# Patient Record
Sex: Female | Born: 1935 | Race: Black or African American | Hispanic: No | Smoking: Former smoker
Health system: Southern US, Community
[De-identification: ages and names within clinical notes are randomized; demographics above are authoritative.]

## PROBLEM LIST (undated history)

## (undated) DIAGNOSIS — T7840XA Allergy, unspecified, initial encounter: Secondary | ICD-10-CM

## (undated) DIAGNOSIS — N289 Disorder of kidney and ureter, unspecified: Secondary | ICD-10-CM

## (undated) DIAGNOSIS — E079 Disorder of thyroid, unspecified: Secondary | ICD-10-CM

## (undated) DIAGNOSIS — E119 Type 2 diabetes mellitus without complications: Secondary | ICD-10-CM

## (undated) HISTORY — DX: Type 2 diabetes mellitus without complications: E11.9

## (undated) HISTORY — PX: CHOLECYSTECTOMY: SHX55

## (undated) HISTORY — DX: Allergy, unspecified, initial encounter: T78.40XA

## (undated) HISTORY — DX: Disorder of thyroid, unspecified: E07.9

---

## 2004-07-29 ENCOUNTER — Ambulatory Visit: Payer: Self-pay | Admitting: Obstetrics and Gynecology

## 2005-08-09 ENCOUNTER — Ambulatory Visit: Payer: Self-pay | Admitting: Obstetrics and Gynecology

## 2005-10-13 ENCOUNTER — Ambulatory Visit: Payer: Self-pay | Admitting: Gastroenterology

## 2006-08-23 ENCOUNTER — Ambulatory Visit: Payer: Self-pay | Admitting: Obstetrics and Gynecology

## 2008-09-09 ENCOUNTER — Ambulatory Visit: Payer: Self-pay | Admitting: Obstetrics and Gynecology

## 2008-11-17 ENCOUNTER — Ambulatory Visit: Payer: Self-pay | Admitting: Gastroenterology

## 2009-07-19 ENCOUNTER — Emergency Department (HOSPITAL_COMMUNITY): Admission: EM | Admit: 2009-07-19 | Discharge: 2009-07-19 | Payer: Self-pay | Admitting: Emergency Medicine

## 2009-10-14 ENCOUNTER — Ambulatory Visit: Payer: Self-pay | Admitting: Obstetrics and Gynecology

## 2010-01-13 ENCOUNTER — Ambulatory Visit: Payer: Self-pay | Admitting: Internal Medicine

## 2010-01-26 ENCOUNTER — Ambulatory Visit: Payer: Self-pay | Admitting: Internal Medicine

## 2010-10-18 ENCOUNTER — Ambulatory Visit: Payer: Self-pay | Admitting: Obstetrics and Gynecology

## 2010-11-28 ENCOUNTER — Ambulatory Visit: Payer: Self-pay | Admitting: Internal Medicine

## 2010-11-29 LAB — CANCER ANTIGEN 19-9: CA 19-9: 10 U/mL (ref 0–35)

## 2010-11-30 LAB — PROT IMMUNOELECTROPHORES(ARMC)

## 2010-12-05 ENCOUNTER — Ambulatory Visit: Payer: Self-pay | Admitting: Internal Medicine

## 2010-12-19 ENCOUNTER — Ambulatory Visit: Payer: Self-pay | Admitting: Internal Medicine

## 2011-01-19 ENCOUNTER — Ambulatory Visit: Payer: Self-pay | Admitting: Internal Medicine

## 2011-01-19 ENCOUNTER — Ambulatory Visit: Payer: Self-pay | Admitting: Physician Assistant

## 2011-01-30 ENCOUNTER — Ambulatory Visit: Payer: Self-pay | Admitting: Physician Assistant

## 2011-08-12 ENCOUNTER — Ambulatory Visit: Payer: Self-pay

## 2011-11-23 ENCOUNTER — Ambulatory Visit: Payer: Self-pay | Admitting: Obstetrics and Gynecology

## 2012-04-25 ENCOUNTER — Ambulatory Visit: Payer: Self-pay | Admitting: Gastroenterology

## 2012-04-26 LAB — PATHOLOGY REPORT

## 2012-12-24 ENCOUNTER — Ambulatory Visit: Payer: Self-pay | Admitting: Obstetrics and Gynecology

## 2013-02-27 ENCOUNTER — Ambulatory Visit: Payer: Self-pay | Admitting: Internal Medicine

## 2013-03-04 ENCOUNTER — Ambulatory Visit: Payer: Self-pay | Admitting: Internal Medicine

## 2013-06-17 ENCOUNTER — Ambulatory Visit: Payer: Self-pay

## 2013-07-15 ENCOUNTER — Ambulatory Visit: Payer: Self-pay

## 2013-08-06 LAB — CBC WITH DIFFERENTIAL/PLATELET
BASOS ABS: 0 10*3/uL (ref 0.0–0.1)
BASOS PCT: 0.4 %
EOS ABS: 0 10*3/uL (ref 0.0–0.7)
Eosinophil %: 0.2 %
HCT: 37.5 % (ref 35.0–47.0)
HGB: 11.8 g/dL — ABNORMAL LOW (ref 12.0–16.0)
Lymphocyte #: 0.7 10*3/uL — ABNORMAL LOW (ref 1.0–3.6)
Lymphocyte %: 6.4 %
MCH: 26.4 pg (ref 26.0–34.0)
MCHC: 31.6 g/dL — AB (ref 32.0–36.0)
MCV: 84 fL (ref 80–100)
MONO ABS: 0.8 x10 3/mm (ref 0.2–0.9)
MONOS PCT: 7.6 %
Neutrophil #: 9 10*3/uL — ABNORMAL HIGH (ref 1.4–6.5)
Neutrophil %: 85.4 %
PLATELETS: 348 10*3/uL (ref 150–440)
RBC: 4.48 10*6/uL (ref 3.80–5.20)
RDW: 14.7 % — AB (ref 11.5–14.5)
WBC: 10.5 10*3/uL (ref 3.6–11.0)

## 2013-08-06 LAB — COMPREHENSIVE METABOLIC PANEL
ALT: 200 U/L — AB (ref 12–78)
ANION GAP: 7 (ref 7–16)
Albumin: 3.7 g/dL (ref 3.4–5.0)
Alkaline Phosphatase: 146 U/L — ABNORMAL HIGH
BILIRUBIN TOTAL: 2 mg/dL — AB (ref 0.2–1.0)
BUN: 11 mg/dL (ref 7–18)
CHLORIDE: 106 mmol/L (ref 98–107)
Calcium, Total: 9.5 mg/dL (ref 8.5–10.1)
Co2: 25 mmol/L (ref 21–32)
Creatinine: 1 mg/dL (ref 0.60–1.30)
GFR CALC NON AF AMER: 54 — AB
Glucose: 180 mg/dL — ABNORMAL HIGH (ref 65–99)
Osmolality: 280 (ref 275–301)
POTASSIUM: 4 mmol/L (ref 3.5–5.1)
SGOT(AST): 298 U/L — ABNORMAL HIGH (ref 15–37)
Sodium: 138 mmol/L (ref 136–145)
Total Protein: 7.7 g/dL (ref 6.4–8.2)

## 2013-08-06 LAB — LIPASE, BLOOD: Lipase: >10000 U/L — ABNORMAL HIGH (ref 73–393)

## 2013-08-06 LAB — URINALYSIS, COMPLETE
Bacteria: NONE SEEN
Bilirubin,UR: NEGATIVE
Blood: NEGATIVE
Glucose,UR: NEGATIVE mg/dL (ref 0–75)
Ketone: NEGATIVE
Leukocyte Esterase: NEGATIVE
NITRITE: NEGATIVE
PH: 7 (ref 4.5–8.0)
PROTEIN: NEGATIVE
RBC,UR: 1 /HPF (ref 0–5)
SPECIFIC GRAVITY: 1.009 (ref 1.003–1.030)
Squamous Epithelial: 4
WBC UR: 3 /HPF (ref 0–5)

## 2013-08-06 LAB — TROPONIN I

## 2013-08-07 ENCOUNTER — Inpatient Hospital Stay: Payer: Self-pay | Admitting: Internal Medicine

## 2013-08-08 LAB — CBC WITH DIFFERENTIAL/PLATELET
BASOS ABS: 0 10*3/uL (ref 0.0–0.1)
BASOS PCT: 0.5 %
EOS ABS: 0.2 10*3/uL (ref 0.0–0.7)
Eosinophil %: 3.2 %
HCT: 29.1 % — ABNORMAL LOW (ref 35.0–47.0)
HGB: 9.2 g/dL — ABNORMAL LOW (ref 12.0–16.0)
Lymphocyte #: 1.4 10*3/uL (ref 1.0–3.6)
Lymphocyte %: 24.5 %
MCH: 26.7 pg (ref 26.0–34.0)
MCHC: 31.7 g/dL — ABNORMAL LOW (ref 32.0–36.0)
MCV: 84 fL (ref 80–100)
Monocyte #: 0.7 x10 3/mm (ref 0.2–0.9)
Monocyte %: 11.2 %
NEUTROS ABS: 3.5 10*3/uL (ref 1.4–6.5)
NEUTROS PCT: 60.6 %
Platelet: 261 10*3/uL (ref 150–440)
RBC: 3.45 10*6/uL — AB (ref 3.80–5.20)
RDW: 15 % — ABNORMAL HIGH (ref 11.5–14.5)
WBC: 5.9 10*3/uL (ref 3.6–11.0)

## 2013-08-08 LAB — COMPREHENSIVE METABOLIC PANEL
ALBUMIN: 2.8 g/dL — AB (ref 3.4–5.0)
ALK PHOS: 103 U/L
ANION GAP: 7 (ref 7–16)
AST: 55 U/L — AB (ref 15–37)
BUN: 8 mg/dL (ref 7–18)
Bilirubin,Total: 0.8 mg/dL (ref 0.2–1.0)
CHLORIDE: 110 mmol/L — AB (ref 98–107)
Calcium, Total: 8.2 mg/dL — ABNORMAL LOW (ref 8.5–10.1)
Co2: 24 mmol/L (ref 21–32)
Creatinine: 0.72 mg/dL (ref 0.60–1.30)
EGFR (Non-African Amer.): >60
Glucose: 93 mg/dL (ref 65–99)
OSMOLALITY: 279 (ref 275–301)
Potassium: 3.7 mmol/L (ref 3.5–5.1)
SGPT (ALT): 121 U/L — ABNORMAL HIGH (ref 12–78)
SODIUM: 141 mmol/L (ref 136–145)
TOTAL PROTEIN: 5.9 g/dL — AB (ref 6.4–8.2)

## 2013-08-08 LAB — LIPASE, BLOOD: Lipase: 415 U/L — ABNORMAL HIGH (ref 73–393)

## 2013-08-09 LAB — COMPREHENSIVE METABOLIC PANEL
ALBUMIN: 2.8 g/dL — AB (ref 3.4–5.0)
ALK PHOS: 95 U/L
ALT: 84 U/L — AB (ref 12–78)
AST: 32 U/L (ref 15–37)
Anion Gap: 4 — ABNORMAL LOW (ref 7–16)
BUN: 5 mg/dL — ABNORMAL LOW (ref 7–18)
Bilirubin,Total: 0.5 mg/dL (ref 0.2–1.0)
CHLORIDE: 111 mmol/L — AB (ref 98–107)
Calcium, Total: 8.5 mg/dL (ref 8.5–10.1)
Co2: 26 mmol/L (ref 21–32)
Creatinine: 0.99 mg/dL (ref 0.60–1.30)
GFR CALC NON AF AMER: 55 — AB
GLUCOSE: 141 mg/dL — AB (ref 65–99)
OSMOLALITY: 281 (ref 275–301)
POTASSIUM: 3.5 mmol/L (ref 3.5–5.1)
Sodium: 141 mmol/L (ref 136–145)
Total Protein: 5.9 g/dL — ABNORMAL LOW (ref 6.4–8.2)

## 2013-08-09 LAB — CBC WITH DIFFERENTIAL/PLATELET
Basophil #: 0 10*3/uL (ref 0.0–0.1)
Basophil %: 0.6 %
EOS ABS: 0.1 10*3/uL (ref 0.0–0.7)
EOS PCT: 2.4 %
HCT: 29.2 % — AB (ref 35.0–47.0)
HGB: 9.3 g/dL — ABNORMAL LOW (ref 12.0–16.0)
LYMPHS PCT: 24.3 %
Lymphocyte #: 1.5 10*3/uL (ref 1.0–3.6)
MCH: 26.8 pg (ref 26.0–34.0)
MCHC: 31.7 g/dL — ABNORMAL LOW (ref 32.0–36.0)
MCV: 85 fL (ref 80–100)
Monocyte #: 0.7 x10 3/mm (ref 0.2–0.9)
Monocyte %: 11.8 %
Neutrophil #: 3.8 10*3/uL (ref 1.4–6.5)
Neutrophil %: 60.9 %
Platelet: 269 10*3/uL (ref 150–440)
RBC: 3.45 10*6/uL — AB (ref 3.80–5.20)
RDW: 15.2 % — AB (ref 11.5–14.5)
WBC: 6.2 10*3/uL (ref 3.6–11.0)

## 2013-08-09 LAB — LIPASE, BLOOD: Lipase: 163 U/L (ref 73–393)

## 2013-08-10 LAB — BASIC METABOLIC PANEL
ANION GAP: 6 — AB (ref 7–16)
BUN: 6 mg/dL — ABNORMAL LOW (ref 7–18)
CHLORIDE: 110 mmol/L — AB (ref 98–107)
Calcium, Total: 8.3 mg/dL — ABNORMAL LOW (ref 8.5–10.1)
Co2: 22 mmol/L (ref 21–32)
Creatinine: 0.99 mg/dL (ref 0.60–1.30)
EGFR (Non-African Amer.): 55 — ABNORMAL LOW
GLUCOSE: 146 mg/dL — AB (ref 65–99)
OSMOLALITY: 276 (ref 275–301)
Potassium: 3.7 mmol/L (ref 3.5–5.1)
Sodium: 138 mmol/L (ref 136–145)

## 2013-08-10 LAB — CBC WITH DIFFERENTIAL/PLATELET
Basophil #: 0 10*3/uL (ref 0.0–0.1)
Basophil %: 0.4 %
Eosinophil #: 0 10*3/uL (ref 0.0–0.7)
Eosinophil %: 0 %
HCT: 29.1 % — AB (ref 35.0–47.0)
HGB: 9.3 g/dL — ABNORMAL LOW (ref 12.0–16.0)
LYMPHS PCT: 10.3 %
Lymphocyte #: 1 10*3/uL (ref 1.0–3.6)
MCH: 26.9 pg (ref 26.0–34.0)
MCHC: 32 g/dL (ref 32.0–36.0)
MCV: 84 fL (ref 80–100)
MONO ABS: 1 x10 3/mm — AB (ref 0.2–0.9)
Monocyte %: 9.6 %
Neutrophil #: 8 10*3/uL — ABNORMAL HIGH (ref 1.4–6.5)
Neutrophil %: 79.7 %
Platelet: 256 10*3/uL (ref 150–440)
RBC: 3.47 10*6/uL — AB (ref 3.80–5.20)
RDW: 15 % — ABNORMAL HIGH (ref 11.5–14.5)
WBC: 10.1 10*3/uL (ref 3.6–11.0)

## 2013-08-10 LAB — HEPATIC FUNCTION PANEL A (ARMC)
Albumin: 2.9 g/dL — ABNORMAL LOW (ref 3.4–5.0)
Alkaline Phosphatase: 83 U/L
BILIRUBIN TOTAL: 0.5 mg/dL (ref 0.2–1.0)
Bilirubin, Direct: 0.1 mg/dL (ref 0.00–0.20)
SGOT(AST): 66 U/L — ABNORMAL HIGH (ref 15–37)
SGPT (ALT): 93 U/L — ABNORMAL HIGH (ref 12–78)
Total Protein: 6 g/dL — ABNORMAL LOW (ref 6.4–8.2)

## 2013-08-10 LAB — LIPASE, BLOOD: LIPASE: 89 U/L (ref 73–393)

## 2013-08-11 LAB — CBC WITH DIFFERENTIAL/PLATELET
Basophil #: 0.1 10*3/uL (ref 0.0–0.1)
Basophil %: 1 %
Eosinophil #: 0.1 10*3/uL (ref 0.0–0.7)
Eosinophil %: 1.5 %
HCT: 29 % — ABNORMAL LOW (ref 35.0–47.0)
HGB: 9.4 g/dL — ABNORMAL LOW (ref 12.0–16.0)
Lymphocyte #: 1.8 10*3/uL (ref 1.0–3.6)
Lymphocyte %: 23.6 %
MCH: 27.1 pg (ref 26.0–34.0)
MCHC: 32.3 g/dL (ref 32.0–36.0)
MCV: 84 fL (ref 80–100)
Monocyte #: 1 x10 3/mm — ABNORMAL HIGH (ref 0.2–0.9)
Monocyte %: 12.6 %
NEUTROS PCT: 61.3 %
Neutrophil #: 4.7 10*3/uL (ref 1.4–6.5)
Platelet: 253 10*3/uL (ref 150–440)
RBC: 3.46 10*6/uL — ABNORMAL LOW (ref 3.80–5.20)
RDW: 14.7 % — ABNORMAL HIGH (ref 11.5–14.5)
WBC: 7.6 10*3/uL (ref 3.6–11.0)

## 2013-08-12 LAB — COMPREHENSIVE METABOLIC PANEL
Albumin: 2.8 g/dL — ABNORMAL LOW (ref 3.4–5.0)
Alkaline Phosphatase: 78 U/L
Anion Gap: 9 (ref 7–16)
BUN: 9 mg/dL (ref 7–18)
Bilirubin,Total: 0.3 mg/dL (ref 0.2–1.0)
CALCIUM: 8.9 mg/dL (ref 8.5–10.1)
Chloride: 106 mmol/L (ref 98–107)
Co2: 25 mmol/L (ref 21–32)
Creatinine: 0.82 mg/dL (ref 0.60–1.30)
EGFR (African American): >60
GLUCOSE: 121 mg/dL — AB (ref 65–99)
Osmolality: 279 (ref 275–301)
Potassium: 3.7 mmol/L (ref 3.5–5.1)
SGOT(AST): 24 U/L (ref 15–37)
SGPT (ALT): 57 U/L (ref 12–78)
SODIUM: 140 mmol/L (ref 136–145)
Total Protein: 5.8 g/dL — ABNORMAL LOW (ref 6.4–8.2)

## 2013-08-12 LAB — CBC WITH DIFFERENTIAL/PLATELET
Basophil #: 0.1 10*3/uL (ref 0.0–0.1)
Basophil %: 0.8 %
Eosinophil #: 0.2 10*3/uL (ref 0.0–0.7)
Eosinophil %: 2.8 %
HCT: 28.8 % — ABNORMAL LOW (ref 35.0–47.0)
HGB: 9.4 g/dL — AB (ref 12.0–16.0)
LYMPHS PCT: 22.3 %
Lymphocyte #: 1.8 10*3/uL (ref 1.0–3.6)
MCH: 27.3 pg (ref 26.0–34.0)
MCHC: 32.7 g/dL (ref 32.0–36.0)
MCV: 84 fL (ref 80–100)
MONOS PCT: 12.1 %
Monocyte #: 1 x10 3/mm — ABNORMAL HIGH (ref 0.2–0.9)
Neutrophil #: 4.9 10*3/uL (ref 1.4–6.5)
Neutrophil %: 62 %
Platelet: 243 10*3/uL (ref 150–440)
RBC: 3.45 10*6/uL — AB (ref 3.80–5.20)
RDW: 15.3 % — ABNORMAL HIGH (ref 11.5–14.5)
WBC: 8 10*3/uL (ref 3.6–11.0)

## 2013-08-13 LAB — PATHOLOGY REPORT

## 2013-09-14 DIAGNOSIS — M1990 Unspecified osteoarthritis, unspecified site: Secondary | ICD-10-CM | POA: Insufficient documentation

## 2013-09-14 DIAGNOSIS — N183 Chronic kidney disease, stage 3 unspecified: Secondary | ICD-10-CM | POA: Insufficient documentation

## 2013-09-14 DIAGNOSIS — E1122 Type 2 diabetes mellitus with diabetic chronic kidney disease: Secondary | ICD-10-CM | POA: Insufficient documentation

## 2013-11-26 ENCOUNTER — Emergency Department: Payer: Self-pay | Admitting: Emergency Medicine

## 2013-11-26 DIAGNOSIS — I10 Essential (primary) hypertension: Secondary | ICD-10-CM | POA: Insufficient documentation

## 2013-11-26 LAB — BASIC METABOLIC PANEL
Anion Gap: 6 — ABNORMAL LOW (ref 7–16)
BUN: 16 mg/dL (ref 7–18)
CHLORIDE: 107 mmol/L (ref 98–107)
CO2: 29 mmol/L (ref 21–32)
Calcium, Total: 9.5 mg/dL (ref 8.5–10.1)
Creatinine: 0.96 mg/dL (ref 0.60–1.30)
EGFR (African American): >60
EGFR (Non-African Amer.): 57 — ABNORMAL LOW
Glucose: 118 mg/dL — ABNORMAL HIGH (ref 65–99)
Osmolality: 285 (ref 275–301)
Potassium: 4.1 mmol/L (ref 3.5–5.1)
Sodium: 142 mmol/L (ref 136–145)

## 2013-11-26 LAB — URINALYSIS, COMPLETE
BILIRUBIN, UR: NEGATIVE
BLOOD: NEGATIVE
Bacteria: NONE SEEN
Glucose,UR: NEGATIVE mg/dL (ref 0–75)
Ketone: NEGATIVE
Leukocyte Esterase: NEGATIVE
Nitrite: NEGATIVE
PROTEIN: NEGATIVE
Ph: 6 (ref 4.5–8.0)
RBC,UR: 1 /HPF (ref 0–5)
SPECIFIC GRAVITY: 1.019 (ref 1.003–1.030)

## 2013-11-26 LAB — CBC
HCT: 36.6 % (ref 35.0–47.0)
HGB: 11.5 g/dL — ABNORMAL LOW (ref 12.0–16.0)
MCH: 27.1 pg (ref 26.0–34.0)
MCHC: 31.5 g/dL — ABNORMAL LOW (ref 32.0–36.0)
MCV: 86 fL (ref 80–100)
PLATELETS: 281 10*3/uL (ref 150–440)
RBC: 4.25 10*6/uL (ref 3.80–5.20)
RDW: 15.3 % — AB (ref 11.5–14.5)
WBC: 7.8 10*3/uL (ref 3.6–11.0)

## 2013-11-26 LAB — CK TOTAL AND CKMB (NOT AT ARMC)
CK, Total: 91 U/L
CK-MB: 1.3 ng/mL (ref 0.5–3.6)

## 2013-11-26 LAB — TROPONIN I: Troponin-I: <0.02 ng/mL

## 2013-12-25 DIAGNOSIS — E042 Nontoxic multinodular goiter: Secondary | ICD-10-CM | POA: Insufficient documentation

## 2013-12-29 ENCOUNTER — Ambulatory Visit: Payer: Self-pay | Admitting: Internal Medicine

## 2014-03-30 ENCOUNTER — Ambulatory Visit: Payer: Self-pay

## 2014-03-30 DIAGNOSIS — I10 Essential (primary) hypertension: Secondary | ICD-10-CM | POA: Diagnosis not present

## 2014-03-30 DIAGNOSIS — E119 Type 2 diabetes mellitus without complications: Secondary | ICD-10-CM | POA: Diagnosis not present

## 2014-03-30 DIAGNOSIS — R609 Edema, unspecified: Secondary | ICD-10-CM | POA: Diagnosis not present

## 2014-03-30 DIAGNOSIS — M7989 Other specified soft tissue disorders: Secondary | ICD-10-CM | POA: Diagnosis not present

## 2014-03-30 DIAGNOSIS — M79605 Pain in left leg: Secondary | ICD-10-CM | POA: Diagnosis not present

## 2014-04-16 DIAGNOSIS — E119 Type 2 diabetes mellitus without complications: Secondary | ICD-10-CM | POA: Diagnosis not present

## 2014-04-16 DIAGNOSIS — R609 Edema, unspecified: Secondary | ICD-10-CM | POA: Diagnosis not present

## 2014-04-24 DIAGNOSIS — E0520 Thyrotoxicosis with toxic multinodular goiter without thyrotoxic crisis or storm: Secondary | ICD-10-CM | POA: Diagnosis not present

## 2014-04-24 DIAGNOSIS — E119 Type 2 diabetes mellitus without complications: Secondary | ICD-10-CM | POA: Diagnosis not present

## 2014-05-01 DIAGNOSIS — E119 Type 2 diabetes mellitus without complications: Secondary | ICD-10-CM | POA: Diagnosis not present

## 2014-05-25 DIAGNOSIS — E7800 Pure hypercholesterolemia, unspecified: Secondary | ICD-10-CM | POA: Insufficient documentation

## 2014-05-25 DIAGNOSIS — E119 Type 2 diabetes mellitus without complications: Secondary | ICD-10-CM | POA: Diagnosis not present

## 2014-05-25 DIAGNOSIS — I10 Essential (primary) hypertension: Secondary | ICD-10-CM | POA: Diagnosis not present

## 2014-05-25 DIAGNOSIS — E0520 Thyrotoxicosis with toxic multinodular goiter without thyrotoxic crisis or storm: Secondary | ICD-10-CM | POA: Diagnosis not present

## 2014-06-15 DIAGNOSIS — M5136 Other intervertebral disc degeneration, lumbar region: Secondary | ICD-10-CM | POA: Diagnosis not present

## 2014-06-15 DIAGNOSIS — M7061 Trochanteric bursitis, right hip: Secondary | ICD-10-CM | POA: Diagnosis not present

## 2014-06-15 DIAGNOSIS — M5416 Radiculopathy, lumbar region: Secondary | ICD-10-CM | POA: Diagnosis not present

## 2014-06-15 DIAGNOSIS — N762 Acute vulvitis: Secondary | ICD-10-CM | POA: Diagnosis not present

## 2014-06-19 ENCOUNTER — Emergency Department
Admit: 2014-06-19 | Disposition: A | Discharge status: Home / Self Care | Payer: Self-pay | Admitting: Emergency Medicine

## 2014-06-19 DIAGNOSIS — R531 Weakness: Secondary | ICD-10-CM | POA: Diagnosis not present

## 2014-06-19 DIAGNOSIS — E119 Type 2 diabetes mellitus without complications: Secondary | ICD-10-CM | POA: Diagnosis not present

## 2014-06-19 DIAGNOSIS — I10 Essential (primary) hypertension: Secondary | ICD-10-CM | POA: Diagnosis not present

## 2014-06-19 DIAGNOSIS — N390 Urinary tract infection, site not specified: Secondary | ICD-10-CM | POA: Diagnosis not present

## 2014-06-19 DIAGNOSIS — Z880 Allergy status to penicillin: Secondary | ICD-10-CM | POA: Diagnosis not present

## 2014-06-19 LAB — CBC WITH DIFFERENTIAL/PLATELET
BASOS PCT: 0.7 %
Basophil #: 0.1 10*3/uL (ref 0.0–0.1)
Eosinophil #: 0.1 10*3/uL (ref 0.0–0.7)
Eosinophil %: 0.5 %
HCT: 35.9 % (ref 35.0–47.0)
HGB: 11.7 g/dL — AB (ref 12.0–16.0)
Lymphocyte #: 1.4 10*3/uL (ref 1.0–3.6)
Lymphocyte %: 12.8 %
MCH: 27.5 pg (ref 26.0–34.0)
MCHC: 32.5 g/dL (ref 32.0–36.0)
MCV: 85 fL (ref 80–100)
MONOS PCT: 8.1 %
Monocyte #: 0.9 x10 3/mm (ref 0.2–0.9)
Neutrophil #: 8.4 10*3/uL — ABNORMAL HIGH (ref 1.4–6.5)
Neutrophil %: 77.9 %
Platelet: 323 10*3/uL (ref 150–440)
RBC: 4.23 10*6/uL (ref 3.80–5.20)
RDW: 14.2 % (ref 11.5–14.5)
WBC: 10.8 10*3/uL (ref 3.6–11.0)

## 2014-06-19 LAB — BASIC METABOLIC PANEL
ANION GAP: 7 (ref 7–16)
BUN: 33 mg/dL — AB
CHLORIDE: 103 mmol/L
CO2: 29 mmol/L
Calcium, Total: 11 mg/dL — ABNORMAL HIGH
Creatinine: 1.65 mg/dL — ABNORMAL HIGH
EGFR (African American): 34 — ABNORMAL LOW
EGFR (Non-African Amer.): 29 — ABNORMAL LOW
GLUCOSE: 156 mg/dL — AB
Potassium: 4.2 mmol/L
SODIUM: 139 mmol/L

## 2014-06-19 LAB — URINALYSIS, COMPLETE
BLOOD: NEGATIVE
Bilirubin,UR: NEGATIVE
Glucose,UR: NEGATIVE mg/dL (ref 0–75)
Ketone: NEGATIVE
NITRITE: NEGATIVE
PH: 6 (ref 4.5–8.0)
Protein: NEGATIVE
RBC,UR: 2 /HPF (ref 0–5)
SPECIFIC GRAVITY: 1.01 (ref 1.003–1.030)
Squamous Epithelial: NONE SEEN

## 2014-06-19 LAB — TROPONIN I

## 2014-06-20 DIAGNOSIS — I10 Essential (primary) hypertension: Secondary | ICD-10-CM | POA: Diagnosis not present

## 2014-06-20 DIAGNOSIS — N390 Urinary tract infection, site not specified: Secondary | ICD-10-CM | POA: Diagnosis not present

## 2014-06-20 DIAGNOSIS — R531 Weakness: Secondary | ICD-10-CM | POA: Diagnosis not present

## 2014-06-20 DIAGNOSIS — E119 Type 2 diabetes mellitus without complications: Secondary | ICD-10-CM | POA: Diagnosis not present

## 2014-06-20 DIAGNOSIS — Z880 Allergy status to penicillin: Secondary | ICD-10-CM | POA: Diagnosis not present

## 2014-06-22 LAB — URINE CULTURE

## 2014-06-29 DIAGNOSIS — I10 Essential (primary) hypertension: Secondary | ICD-10-CM | POA: Diagnosis not present

## 2014-06-29 DIAGNOSIS — E119 Type 2 diabetes mellitus without complications: Secondary | ICD-10-CM | POA: Diagnosis not present

## 2014-06-29 DIAGNOSIS — N390 Urinary tract infection, site not specified: Secondary | ICD-10-CM | POA: Diagnosis not present

## 2014-06-29 DIAGNOSIS — E780 Pure hypercholesterolemia: Secondary | ICD-10-CM | POA: Diagnosis not present

## 2014-07-11 NOTE — Consult Note (Signed)
PATIENT NAME:  Sherry [name], Sherry [name] MR#:  [id number] DATE OF BIRTH:  12/28/1934  DATE OF CONSULTATION:  08/07/2013  REFERRING PHYSICIAN:   CONSULTING PHYSICIAN:  Rodena Goldmann III, MD  PRIMARY CARE PHYSICIAN: Frazier Richards.   ADMITTING PHYSICIAN: [employer].    CHIEF COMPLAINT: Abdominal pain.   BRIEF HISTORY: Sherry [name] is a 80 year old woman with approximately a 2-week history of intermittent abdominal discomfort. The pain is primarily midepigastric, radiating through to her back. It would occur after meals, be accompanied by significant nausea and vomiting and then resolve. She has had multiple episodes and thought it had something to do with the type of food she was eating. She has lost some weight recently. She does have a history of diabetes and has changed her diet. The episode that began on the 19th of May did not resolve, and she presented to the Emergency Room when she could not arrange for evaluation by her primary care physician. Workup in the Emergency Room revealed an elevated white blood cell count at 10,500. Lipase was greater than 10,000. Bilirubin was 2.0 with an alkaline phosphatase of 146. Transaminases were slightly elevated. Gallbladder ultrasound was performed which demonstrated some mild gallbladder wall thickening, pericholecystic fluid and a positive sonographic Murphy sign, along with multiple small gallstones. 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   The patient denies any history of hepatitis, yellow jaundice, pancreatitis, peptic ulcer disease, previous diagnosis of gallbladder disease or diverticulitis. Only previous surgery was a C-section and appendectomy. She has no history of cardiac disease, hypertension or thyroid problems. She is diabetic, currently on oral medications. She is followed by Dr. Frazier Richards at the [facility]. She is a reformed cigarette  smoker and does not drink alcohol. Her only allergy is PENICILLIN which causes swelling in her feet and a rash.   REVIEW OF SYSTEMS: Largely unremarkable.   PHYSICAL EXAMINATION:  GENERAL: She is an alert, pleasant woman lying comfortably in bed.  VITAL SIGNS: Blood pressure 170/88. Heart rate is 90 and regular. She is afebrile.  HEENT: Unremarkable with no evidence of any scleral icterus, pupillary abnormalities, facial deformities.  NECK: Supple, nontender with a midline trachea. No adenopathy.  CHEST: Clear with no adventitious sounds. She has normal pulmonary excursion.  CARDIAC: No murmurs or gallops to my ear, and she seems to be in normal sinus rhythm.  ABDOMEN: Mild upper abdominal tenderness, primarily in the midepigastric area. She does have hypoactive but present bowel sounds. I cannot palpate her gallbladder. She has some mild guarding with no rebound.  LOWER EXTREMITIES: Full range of motion. No deformities.  PSYCHIATRIC: Normal orientation. Normal affect.   IMPRESSION: This woman appears to have biliary pancreatitis. She may have acute cholecystitis in addition, although I suspect that her pericholecystic fluid is likely due to the edema from her pancreatitis. I agree with bowel rest at the present time, and I have no concerns with empiric antibiotics. We will continue to follow her. She may need to be considered for surgical intervention, although surgery will be designed to avoid further insults rather than be therapeutic with the current attack. She is aware of this limitation and is in agreement.   ____________________________ Micheline Maze, MD rle:gb D: 08/07/2013 04:03:07 ET T: 08/07/2013 04:39:28 ET JOB#: [id number]  cc: Micheline Maze, MD, <Dictator> Ocie Cornfield. Ouida Sills, MD Rodena Goldmann MD ELECTRONICALLY SIGNED 08/09/2013 10:13

## 2014-07-11 NOTE — Discharge Summary (Signed)
PATIENT NAME:  Sherry [name], COUSIN MR#:  [id number] DATE OF BIRTH:  [birth year]  DATE OF ADMISSION:  08/07/2013 DATE OF DISCHARGE: 08/12/2013  DISCHARGE DIAGNOSES:  1. Gallstone pancreatitis.  2. Cholecystitis.   DISCHARGE MEDICATIONS:  1. Norco #20 given on prescription 1-2 q. 4 hours p.r.n. pain.   2. Phenergan 25 mg or 20 one q. 4 hours p.r.n. nausea. 3. Progesterone 200 mg daily. 4. Metformin 500 mg 2 daily. 5. Estradiol 0.5 daily.  6. Losartan 100 mg daily.   HISTORY AND PHYSICAL: Please see detailed history and physical done on admission.   HOSPITAL COURSE: The patient was admitted with abdominal pain, found to have gallstone pancreatitis with elevated ALT and AST, lipase over 10,000. She improved over a couple of days, and gallstones were seen on ultrasound. Surgery was consulted. Eventually, cholecystectomy was done. She continued to slowly improve, had some minimal nausea, and possible slight vomiting last night which responded to antiemetics. Her lipase and liver functions gradually improved; and by the 24th, lipase was normal and yesterday's date white count was 7000 and today 8000 with mild anemia, which is likely postoperative acute hemorrhagic. Her ALT and AST were normal on today's date as well. She will likely go home today if she tolerates her breakfast and lunch well. We will arrange to see her soon in the office, and she will see surgery soon as well.    ____________________________ Ocie Cornfield. Ouida Sills, MD [id number] D: 08/12/2013 07:59:44 ET T: 08/12/2013 08:17:55 ET JOB#: [id number]  cc: Ocie Cornfield. Ouida Sills, MD, <Dictator> Kirk Ruths MD ELECTRONICALLY SIGNED 08/12/2013 10:57

## 2014-07-11 NOTE — Op Note (Signed)
PATIENT NAME:  Sherry [name], Sherry [name] MR#:  [id number] DATE OF BIRTH:  Jul 29, 1934  DATE OF PROCEDURE:  08/09/2013  PREOPERATIVE DIAGNOSES:  1. Acute cholecystitis. 2. Biliary pancreatitis.   POSTOPERATIVE DIAGNOSES:  1. Acute cholecystitis. 2. Biliary pancreatitis.   PROCEDURE: Laparoscopic cholecystectomy.   SURGEON: Dr. Pat Patrick.   ANESTHESIA: General.   OPERATIVE PROCEDURE: With the patient in the supine position, after receiving appropriate general anesthesia, the base of the abdomen was prepped with ChloraPrep and draped with sterile towels. Sherry [name] was placed in the head down, feet up position. A small infraumbilical incision was made in the standard fashion and carried down bluntly through the subcutaneous tissue. A Veress needle was used to cannulate the peritoneal cavity. CO2 was insufflated to appropriate pressure measurements. When approximately 2.5 liters of CO2 were instilled, the Veress needle was withdrawn and intraperitoneal position was confirmed. CO2 was reinsufflated. The patient was placed in the head up, feet down position, and rotated slightly to the left side. A subxiphoid transverse incision was made and an 11-mm port inserted under direct vision. Two lateral ports, 5 mm in size, were inserted under direct vision. The camera was moved to the upper port and the midline port examined since Sherry [name] had previous midline surgery. There were multiple adhesions from the omentum, but I do not see any bowel or demonstrate any evidence of bowel injury. Gallbladder was fairly tense and distended. There was significant edema and inflammatory change. There were multiple adhesions. It was retracted superiorly and laterally, exposing the hepatoduodenal ligament. The cystic artery and cystic duct were identified. The cystic duct was clipped on the gallbladder side and opened. The cystic duct was quite thickened and small and I could not get a cholangiogram catheter into the bile duct. However, there was  reflux of bile back out of the duct suggesting there may be proximal obstruction which persists. The duct was triply clipped on the common duct side and divided. The cystic artery was doubly clipped and divided. The gallbladder was then dissected free from its bed and delivered using hook and cautery apparatus. Once the gallbladder was free, it was captured in an Endo Catch apparatus and removed through the subxiphoid incision. There was a small arterial bleeder in the bed of the liver which was cauterized. There were multiple bleeding edges off the liver which were cauterized and treated with manual pressure using instruments pressing the liver against the bowel. The liver was quite distended and fatty with rounded edges clearly abnormal hepatosteatosis. Because of the edema and the bleeding problem from the liver, a 10-mm flat Jackson-Pratt drain was inserted through the epigastric port, brought out through 1 of the lateral stab wounds, and secured in the bed of the liver. It was sutured in place with 3-0 nylon. The area was copiously irrigated with 2 liters of warm saline solution. The abdomen was desufflated. Ports withdrawn without difficulty. Skin incisions were closed with 5-0 nylon. The area was infiltrated with 0.25% Marcaine for postoperative pain control. Sterile dressings were applied. The patient was returned to the recovery room, having tolerated the procedure well. Sponge, instrument, and needle counts were correct x 2 in the operating room.    ____________________________ Sherry Maze, MD rle:lt D: 08/09/2013 10:00:24 ET T: 08/09/2013 20:49:43 ET JOB#: [id number]  cc: Sherry Maze, MD, <Dictator> Ocie Cornfield. Ouida Sills, MD Rodena Goldmann MD ELECTRONICALLY SIGNED 08/10/2013 16:50

## 2014-07-11 NOTE — Discharge Summary (Signed)
ADDENDUM:  PATIENT NAME:  Sherry [name], Sherry [name] MR#:  [id number] DATE OF BIRTH:  [birth year]  DATE OF ADMISSION:  08/07/2013 DATE OF DISCHARGE:  08/13/2013  The patient did well over the course of the day late yesterday and this morning. She had some nausea earlier in the day as her discharge was held. No other complications noted, so she will be discharged on the same medicines without further changes. She has followup with a general surgery and myself soon.   ____________________________ Ocie Cornfield. Ouida Sills, MD mwa:aw D: 08/13/2013 07:47:48 ET T: 08/13/2013 08:16:22 ET JOB#: [id number]  cc: Ocie Cornfield. Ouida Sills, MD, <Dictator> Kirk Ruths MD ELECTRONICALLY SIGNED 08/13/2013 11:18

## 2014-07-11 NOTE — H&P (Signed)
PATIENT NAME:  Sherry [name], Sherry [name] MR#:  [id number] DATE OF BIRTH:  1934/05/10  DATE OF ADMISSION:  08/07/2013  REFERRING PHYSICIAN: Dr. Marjean Donna  PRIMARY CARE PHYSICIAN: Dr. Ouida Sills of [facility].   CHIEF COMPLAINT: Abdominal pain.   HISTORY OF PRESENT ILLNESS:  This is a 80 year old African American female with past medical history of diabetes, was non-insulin-requiring, as well as hypertension, presenting with abdominal pain. She describes a 2-day duration of abdominal pain, acute onset, epigastric in location 9 to 10 out of 10 in intensity, sharp and stabbing in quality with radiation to the back.  States worsened with food and no change with liquid. No relieving factors. She had associated nausea and vomiting multiple, particularly after attempting to eat. She states that her vomit was possibly bile-tinged; however, no blood was noted. Denies any changes in bowel habits. Pain is improved in the Emergency Department after receiving IV morphine.  Through initial work-up from the Emergency Department, revealed to have pancreatitis as well as cholecystitis. Currently, her pain is only 1 to 2 out of 10 in comparison to her prior symptoms.   REVIEW OF SYSTEMS:  CONSTITUTIONAL: Denies fever, fatigue, weakness. EYES: Denies blurry, double vision, or eye pain.  HEENT: Denies tinnitus, ear pain, hearing loss.  RESPIRATORY: Denies cough, wheeze, shortness of breath. CARDIOVASCULAR: . Denies chest pain, palpitations or edema.  GASTROINTESTINAL: Positive for nausea, vomiting, abdominal pain as described above.  Denies any diarrhea or melena. GENITOURINARY: Denies dysuria or hematuria.  ENDOCRINE: Denies nocturia or thyroid problems.  HEMATOLOGIC AND LYMPHATIC: Denies easy bruising, bleeding. SKIN: Denies rash or lesions. MUSCULOSKELETAL: Denies pain in neck, back, shoulders, knees, or hips.  NEUROLOGIC: Denies paralysis or paresthesias.  PSYCHIATRIC: Denies anxiety or depressive symptoms.    Otherwise, full review of systems performed is negative.   PAST MEDICAL HISTORY: Hypertension, osteoporosis, enlarged thyroid which she states she has received 1 dose of radioactive iodine as well as diabetes.   SOCIAL HISTORY: Remote tobacco usage. Has not smoked in greater than 25 years. Occasional alcohol usage. Denies drug usage.   FAMILY HISTORY: Positive for diabetes, as well as lung cancer.   ALLERGIES: PENICILLIN WHICH CAUSES SWELLING.   HOME MEDICATIONS: Includes losartan 100 mg p.o. daily, metformin extended release 500 mg p.o. b.i.d., estradiol 0.5 mg p.o. daily, progesterone 200 mg p.o. daily.   PHYSICAL EXAMINATION:  VITAL SIGNS: Temperature 98.7, heart rate 89, respirations 20, blood pressure 189/80, saturating 99% on room air, weight 84.8 kg, BMI of 31.1.  GENERAL: Well-nourished, well-developed [place] American female, currently in no acute distress.  HEAD: Normocephalic, atraumatic.  EYES: Pupils equal, round, reactive to light. Extraocular muscles intact. Positive for scleral icterus.  MOUTH: Moist mucosal membranes.  Dentition intact. No abscess noted.  Positive sublingual jaundice.  Throat clear without exudates. No external lesions.  NECK: Supple. No thyromegaly. No nodules. No JVD.  PULMONARY: Clear to auscultation bilaterally without wheezes, rubs, or rhonchi. No use of accessory muscles. Good respiratory effort.  CHEST:  Nontender on palpation.  CARDIOVASCULAR: S1, S2, regular rate and rhythm. No murmurs, rubs, or gallops. No edema. Pedal pulses 2+ bilaterally.  GASTROINTESTINAL: Soft, tender to palpation over epigastric with voluntary guarding; however, no rebound or motion tenderness. Nondistended. No masses. Hypoactive bowel sounds. No hepatosplenomegaly.   MUSCULOSKELETAL: No swelling, clubbing, or edema. Range of motion is full in all extremities. NEUROLOGIC: Cranial nerves II through XII intact. No gross focal neurologic deficits. Sensation intact. Reflexes  intact.  SKIN: No ulcerations, lesions, rashes, cyanosis.  Skin warm, dry. Turgor intact.  PSYCHIATRIC: Mood and affect within normal limits. The patient awake, alert, and oriented x 3. Insight and judgment intact.   LABORATORY DATA:  Abdominal ultrasound performed revealing sonographic findings consistent with acute cholecystitis with multiple small gallstones within the gallbladder and gallbladder wall  thickening at 3.2 mm and there is pericholecystic fluid. There is also a note of some benign liver lesions, which appear unchanged from prior examinations. Remainder of laboratory data: Sodium 130, potassium 4, chloride 106, bicarbonate 25, BUN 11, creatinine 1, glucose 180, lipase greater than 10,000. LFTs: Total protein 7.7, albumin 3.7, bilirubin 2, alkaline phosphatase 146, AST 298, ALT 200, troponin less than 0.02. WBC of 10.5, hemoglobin 11.8, platelets of 348,000. Urinalysis negative for evidence of infection.   ASSESSMENT AND PLAN: A 80 year old [place] American female with a history of diabetes, hypertension, abdominal pain, found to have pancreatitis as well as cholecystitis. 1. Acute pancreatitis. Etiology, given the workup and presentation, most consistent with gallstone pancreatitis. We will provide IV fluid hydration with normal saline at 200 mL an hour as well as pain medications. She will be placed on an n.p.o. status.  Whenever she no longer requires pain medication, she can advance her diet.  2. Acute cholecystitis. We will consult general surgery. They have already been paged by the Emergency Department staff however currently in the operating room. The patient has no signs of sepsis. We will dose antibiotics for secondary prevention with ciprofloxacin and Flagyl given her penicillin allergy which causes swelling.  3. Diabetes.  We will hold p.o. agents and add insulin sliding scale with q. 6 hour Accu-Cheks.  4. Hypertension. We will add p.r.n. hydralazine for systolic blood pressures  greater than 080 or diastolics greater than 223.  5. Venous thromboembolism prophylaxis with SCDs.   CODE STATUS: The patient is full code.   TIME SPENT: 45 minutes.    ____________________________ Aaron Mose. Nakeeta Sebastiani, MD dkh:dd D: 08/07/2013 01:58:15 ET T: 08/07/2013 02:23:29 ET JOB#: [id number]  cc: Aaron Mose. Viviane Semidey, MD, <Dictator> Iviana Blasingame Woodfin Ganja MD ELECTRONICALLY SIGNED 08/07/2013 20:28

## 2014-07-13 DIAGNOSIS — E119 Type 2 diabetes mellitus without complications: Secondary | ICD-10-CM | POA: Diagnosis not present

## 2014-07-13 DIAGNOSIS — I10 Essential (primary) hypertension: Secondary | ICD-10-CM | POA: Diagnosis not present

## 2014-07-13 DIAGNOSIS — N179 Acute kidney failure, unspecified: Secondary | ICD-10-CM | POA: Diagnosis not present

## 2014-07-13 DIAGNOSIS — N390 Urinary tract infection, site not specified: Secondary | ICD-10-CM | POA: Diagnosis not present

## 2014-07-14 ENCOUNTER — Other Ambulatory Visit: Payer: Self-pay | Admitting: Physical Medicine and Rehabilitation

## 2014-07-14 DIAGNOSIS — M5416 Radiculopathy, lumbar region: Secondary | ICD-10-CM | POA: Insufficient documentation

## 2014-07-14 DIAGNOSIS — M5136 Other intervertebral disc degeneration, lumbar region: Secondary | ICD-10-CM | POA: Diagnosis not present

## 2014-07-14 DIAGNOSIS — M5417 Radiculopathy, lumbosacral region: Secondary | ICD-10-CM

## 2014-07-14 DIAGNOSIS — M7061 Trochanteric bursitis, right hip: Secondary | ICD-10-CM | POA: Diagnosis not present

## 2014-07-23 ENCOUNTER — Ambulatory Visit
Admission: RE | Admit: 2014-07-23 | Discharge: 2014-07-23 | Disposition: A | Discharge status: Home / Self Care | Payer: Commercial Managed Care - HMO | Source: Ambulatory Visit | Attending: Physical Medicine and Rehabilitation | Admitting: Physical Medicine and Rehabilitation

## 2014-07-23 DIAGNOSIS — M5417 Radiculopathy, lumbosacral region: Secondary | ICD-10-CM

## 2014-07-23 DIAGNOSIS — M47896 Other spondylosis, lumbar region: Secondary | ICD-10-CM | POA: Diagnosis not present

## 2014-07-23 DIAGNOSIS — M545 Low back pain: Secondary | ICD-10-CM | POA: Diagnosis present

## 2014-07-23 DIAGNOSIS — M79604 Pain in right leg: Secondary | ICD-10-CM | POA: Diagnosis present

## 2014-07-23 DIAGNOSIS — M5136 Other intervertebral disc degeneration, lumbar region: Secondary | ICD-10-CM | POA: Diagnosis not present

## 2014-07-23 DIAGNOSIS — M5126 Other intervertebral disc displacement, lumbar region: Secondary | ICD-10-CM | POA: Diagnosis not present

## 2014-08-10 DIAGNOSIS — E1122 Type 2 diabetes mellitus with diabetic chronic kidney disease: Secondary | ICD-10-CM | POA: Diagnosis not present

## 2014-08-10 DIAGNOSIS — E0520 Thyrotoxicosis with toxic multinodular goiter without thyrotoxic crisis or storm: Secondary | ICD-10-CM | POA: Diagnosis not present

## 2014-08-10 DIAGNOSIS — M791 Myalgia: Secondary | ICD-10-CM | POA: Diagnosis not present

## 2014-08-10 DIAGNOSIS — E780 Pure hypercholesterolemia: Secondary | ICD-10-CM | POA: Diagnosis not present

## 2014-08-10 DIAGNOSIS — I10 Essential (primary) hypertension: Secondary | ICD-10-CM | POA: Diagnosis not present

## 2014-08-31 DIAGNOSIS — M5030 Other cervical disc degeneration, unspecified cervical region: Secondary | ICD-10-CM | POA: Diagnosis not present

## 2014-08-31 DIAGNOSIS — M5412 Radiculopathy, cervical region: Secondary | ICD-10-CM | POA: Diagnosis not present

## 2014-08-31 DIAGNOSIS — M4806 Spinal stenosis, lumbar region: Secondary | ICD-10-CM | POA: Diagnosis not present

## 2014-08-31 DIAGNOSIS — M5136 Other intervertebral disc degeneration, lumbar region: Secondary | ICD-10-CM | POA: Diagnosis not present

## 2014-09-14 DIAGNOSIS — M542 Cervicalgia: Secondary | ICD-10-CM | POA: Diagnosis not present

## 2014-09-16 DIAGNOSIS — M542 Cervicalgia: Secondary | ICD-10-CM | POA: Diagnosis not present

## 2014-09-24 DIAGNOSIS — E119 Type 2 diabetes mellitus without complications: Secondary | ICD-10-CM | POA: Diagnosis not present

## 2014-09-24 DIAGNOSIS — M542 Cervicalgia: Secondary | ICD-10-CM | POA: Diagnosis not present

## 2014-09-24 DIAGNOSIS — E1122 Type 2 diabetes mellitus with diabetic chronic kidney disease: Secondary | ICD-10-CM | POA: Diagnosis not present

## 2014-09-24 DIAGNOSIS — N183 Chronic kidney disease, stage 3 (moderate): Secondary | ICD-10-CM | POA: Diagnosis not present

## 2014-09-24 DIAGNOSIS — E780 Pure hypercholesterolemia: Secondary | ICD-10-CM | POA: Diagnosis not present

## 2014-09-24 DIAGNOSIS — I10 Essential (primary) hypertension: Secondary | ICD-10-CM | POA: Diagnosis not present

## 2014-09-24 DIAGNOSIS — E0520 Thyrotoxicosis with toxic multinodular goiter without thyrotoxic crisis or storm: Secondary | ICD-10-CM | POA: Diagnosis not present

## 2014-09-29 DIAGNOSIS — M542 Cervicalgia: Secondary | ICD-10-CM | POA: Diagnosis not present

## 2014-10-13 DIAGNOSIS — M542 Cervicalgia: Secondary | ICD-10-CM | POA: Diagnosis not present

## 2014-10-20 DIAGNOSIS — M542 Cervicalgia: Secondary | ICD-10-CM | POA: Diagnosis not present

## 2014-10-27 DIAGNOSIS — M542 Cervicalgia: Secondary | ICD-10-CM | POA: Diagnosis not present

## 2014-11-02 DIAGNOSIS — L989 Disorder of the skin and subcutaneous tissue, unspecified: Secondary | ICD-10-CM | POA: Diagnosis not present

## 2014-11-02 DIAGNOSIS — J40 Bronchitis, not specified as acute or chronic: Secondary | ICD-10-CM | POA: Diagnosis not present

## 2014-11-03 DIAGNOSIS — M5030 Other cervical disc degeneration, unspecified cervical region: Secondary | ICD-10-CM | POA: Insufficient documentation

## 2014-11-03 DIAGNOSIS — M7061 Trochanteric bursitis, right hip: Secondary | ICD-10-CM | POA: Diagnosis not present

## 2014-11-03 DIAGNOSIS — M5412 Radiculopathy, cervical region: Secondary | ICD-10-CM | POA: Insufficient documentation

## 2014-11-03 DIAGNOSIS — M48062 Spinal stenosis, lumbar region with neurogenic claudication: Secondary | ICD-10-CM | POA: Insufficient documentation

## 2014-11-03 DIAGNOSIS — M4806 Spinal stenosis, lumbar region: Secondary | ICD-10-CM | POA: Diagnosis not present

## 2014-11-03 DIAGNOSIS — M5136 Other intervertebral disc degeneration, lumbar region: Secondary | ICD-10-CM | POA: Diagnosis not present

## 2014-11-12 DIAGNOSIS — M542 Cervicalgia: Secondary | ICD-10-CM | POA: Diagnosis not present

## 2014-12-04 DIAGNOSIS — R05 Cough: Secondary | ICD-10-CM | POA: Diagnosis not present

## 2014-12-04 DIAGNOSIS — E1122 Type 2 diabetes mellitus with diabetic chronic kidney disease: Secondary | ICD-10-CM | POA: Diagnosis not present

## 2014-12-04 DIAGNOSIS — I10 Essential (primary) hypertension: Secondary | ICD-10-CM | POA: Diagnosis not present

## 2014-12-04 DIAGNOSIS — N183 Chronic kidney disease, stage 3 (moderate): Secondary | ICD-10-CM | POA: Diagnosis not present

## 2014-12-04 DIAGNOSIS — E0520 Thyrotoxicosis with toxic multinodular goiter without thyrotoxic crisis or storm: Secondary | ICD-10-CM | POA: Diagnosis not present

## 2014-12-04 DIAGNOSIS — E780 Pure hypercholesterolemia: Secondary | ICD-10-CM | POA: Diagnosis not present

## 2014-12-09 DIAGNOSIS — R05 Cough: Secondary | ICD-10-CM | POA: Diagnosis not present

## 2014-12-09 DIAGNOSIS — R0602 Shortness of breath: Secondary | ICD-10-CM | POA: Diagnosis not present

## 2014-12-09 DIAGNOSIS — Z8679 Personal history of other diseases of the circulatory system: Secondary | ICD-10-CM | POA: Diagnosis not present

## 2014-12-09 DIAGNOSIS — J309 Allergic rhinitis, unspecified: Secondary | ICD-10-CM | POA: Diagnosis not present

## 2014-12-09 DIAGNOSIS — J439 Emphysema, unspecified: Secondary | ICD-10-CM | POA: Diagnosis not present

## 2014-12-15 DIAGNOSIS — M7061 Trochanteric bursitis, right hip: Secondary | ICD-10-CM | POA: Diagnosis not present

## 2014-12-15 DIAGNOSIS — M4806 Spinal stenosis, lumbar region: Secondary | ICD-10-CM | POA: Diagnosis not present

## 2014-12-15 DIAGNOSIS — M5416 Radiculopathy, lumbar region: Secondary | ICD-10-CM | POA: Diagnosis not present

## 2014-12-15 DIAGNOSIS — M5030 Other cervical disc degeneration, unspecified cervical region: Secondary | ICD-10-CM | POA: Diagnosis not present

## 2014-12-15 DIAGNOSIS — M5136 Other intervertebral disc degeneration, lumbar region: Secondary | ICD-10-CM | POA: Diagnosis not present

## 2014-12-15 DIAGNOSIS — M5412 Radiculopathy, cervical region: Secondary | ICD-10-CM | POA: Diagnosis not present

## 2014-12-17 DIAGNOSIS — I517 Cardiomegaly: Secondary | ICD-10-CM | POA: Diagnosis not present

## 2014-12-25 DIAGNOSIS — M5416 Radiculopathy, lumbar region: Secondary | ICD-10-CM | POA: Diagnosis not present

## 2014-12-25 DIAGNOSIS — R6889 Other general symptoms and signs: Secondary | ICD-10-CM | POA: Diagnosis not present

## 2014-12-25 DIAGNOSIS — M5136 Other intervertebral disc degeneration, lumbar region: Secondary | ICD-10-CM | POA: Diagnosis not present

## 2014-12-25 DIAGNOSIS — M4806 Spinal stenosis, lumbar region: Secondary | ICD-10-CM | POA: Diagnosis not present

## 2015-01-15 DIAGNOSIS — M5416 Radiculopathy, lumbar region: Secondary | ICD-10-CM | POA: Diagnosis not present

## 2015-01-15 DIAGNOSIS — M4806 Spinal stenosis, lumbar region: Secondary | ICD-10-CM | POA: Diagnosis not present

## 2015-01-15 DIAGNOSIS — R6889 Other general symptoms and signs: Secondary | ICD-10-CM | POA: Diagnosis not present

## 2015-01-15 DIAGNOSIS — M5136 Other intervertebral disc degeneration, lumbar region: Secondary | ICD-10-CM | POA: Diagnosis not present

## 2015-02-09 DIAGNOSIS — N183 Chronic kidney disease, stage 3 (moderate): Secondary | ICD-10-CM | POA: Diagnosis not present

## 2015-02-09 DIAGNOSIS — E1122 Type 2 diabetes mellitus with diabetic chronic kidney disease: Secondary | ICD-10-CM | POA: Diagnosis not present

## 2015-02-09 DIAGNOSIS — E7800 Pure hypercholesterolemia, unspecified: Secondary | ICD-10-CM | POA: Diagnosis not present

## 2015-02-09 DIAGNOSIS — I10 Essential (primary) hypertension: Secondary | ICD-10-CM | POA: Diagnosis not present

## 2015-02-24 DIAGNOSIS — Z23 Encounter for immunization: Secondary | ICD-10-CM | POA: Diagnosis not present

## 2015-03-09 ENCOUNTER — Other Ambulatory Visit: Payer: Self-pay | Admitting: *Deleted

## 2015-03-09 NOTE — Patient Outreach (Signed)
[facility]) Care Management  03/09/2015  MICHELENA SIBAYAN September 26, 1934 [id number]   Subjective: Telephone call to patient's home number, no answer, left HIPAA compliant voicemail message and requested call back.  Objective: Per Epic chart review, no inpatient admissions noted for 2016.    Assessment:  Received [facility] HMO Tier 4 list referral.  Diagnosis listed is DM.   Patient has had no ED visits and had 3 admits.   Plan: RNCM will call patient for telephone screening within 3 business days if no return call from patient.  Azalynn Maxim H. Annia Friendly, BSN, [employer] Management [facility] Telephonic CM Phone: [phone] Fax: [phone]

## 2015-03-10 ENCOUNTER — Other Ambulatory Visit: Payer: Self-pay | Admitting: *Deleted

## 2015-03-10 NOTE — Patient Outreach (Signed)
[place] [facility]) Care Management  03/10/2015  Sherry [name] [birth year] [id number]  Subjective: Telephone call to patient's home number, left HIPAA compliant voicemail message and requested call back.   Objective: Per Epic chart review, no inpatient admissions noted for 2016.   Assessment: Received [facility] HMO Tier 4 list referral. Diagnosis listed is DM. Patient has had no ED visits and had 3 admits.   Plan: RNCM will call patient for telephone screening within 1 week,  if no return call from patient.    Sherry [name] H. Annia Friendly, BSN, [place] Telephonic CM Phone: [phone]

## 2015-03-16 ENCOUNTER — Other Ambulatory Visit: Payer: Self-pay

## 2015-03-16 ENCOUNTER — Other Ambulatory Visit: Payer: Self-pay | Admitting: *Deleted

## 2015-03-16 DIAGNOSIS — Z794 Long term (current) use of insulin: Principal | ICD-10-CM

## 2015-03-16 DIAGNOSIS — E0822 Diabetes mellitus due to underlying condition with diabetic chronic kidney disease: Secondary | ICD-10-CM

## 2015-03-16 NOTE — Patient Outreach (Signed)
[place] [facility]) Care Management  03/16/2015  PAKOU TURSI 06/17/1934 [id number]  Outreach call to patient.  Patient not reached.  [phone] home left voice message [phone] cell - Patient reached at cell #  H/o patient currently residing with son, Iona Beard in [place], [place] since February 10, 2015.   H/o patient planning to return back to her home in [place], [place] but was not able to provide [facility] with date in earlier contact call today.  Patient states she plans to return to her home in 2 weeks.    Plan:  RN CM advised patient to contact her  insurance provider via the customer service # should healthcare services be needed or benefit questions while in [place].   RN CM advised in [facility] Referral to [facility] for Diabetes education with contact call within the next 30 days.    Mariann Laster, RN, BSN, [facility], CCM  [employer] Management Coordinator [phone] Direct [phone] Cell [phone] Office [phone] Fax

## 2015-03-16 NOTE — Patient Outreach (Addendum)
[facility]) Care Management  03/16/2015  SHANIEL SELMON [birth year] [id number]   Subjective: Telephone call to patient's home number, no answer, left HIPAA compliant voicemail message for patient, requested call.  RNCM made 4th telephone outreach to patient, called work number [phone]) listed in [place].   Patient states the work number listed in [place] is actually her cell phone number.  Spoke with patient and verified HIPPA.   Discussed [facility] Care Management services with patient.   Patient verbalized understanding and is in agreement to receive [place] Management services.    Patient gave [facility] verbally authorization to speak with patient's children Lohgan Faletti. [phone] = cell phone #) and Bennett Scrape) regarding patient's healthcare needs.    Patient states her son Iona Beard lives in [place] and her daughter Kremlin lives in [place].  During the course of the conversation with patient, RNCM was advised by patient that she is currently visiting her son Iona Beard in [place] and is unsure of when she will return home to [facility].  States her son is in charge of when she will return home.  States she is currently having the [place]  post office to hold her mail and it is not being sent to her son's address.     Patient states she has been in [place] since 02/10/15.   Patient states she is enjoying spending time with her family.  States she also has 2 grandchildren.   Patient states has not had any hospitalizations since Aug 09, 2013 when she had gall bladder and pancreas issues.  Patient states she was in the hospital for 8 days in May of 2015.  States she went to stay with her sister after inpatient stay for a few weeks and received home health.   Patient states she does not remember the name of the home health agency, may have been [facility] but is not for sure.  States she has had issues with dizziness and balance since hospitalization.  States MD is aware of her  dizziness and balance issues and has made adjustments to her medications which has decrease these issues but not totally corrected them.  States she uses a Teaching laboratory technician that she borrowed from her sister to ambulate with as needed.   States the rollator helps her with her balance issues.  Patient states primary MD Dr. Ouida Sills has decrease the dosage of Losartan from 100 mg to 25 mg per day .  States she started her new dosage of Losartan on 02/20/15. Patient states she also has a thyroid workup in April of 2015 and everything came back benign.     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  States she would like for her ambulation to improve since her last hospitalization.   Patient states he had a fall prior to leaving for [facility] in her house.  States she did not have any injury from the fall.  States she fell because she was trying to kill some bats that had gotten into her basement.   States she initially thought they were birds.   Patient states she will follow up with a  pest control service to get her home evaluated for bats when she returns to [facility].    Patient states she would like to received additional education on diabetes management.  Patient states she would also like to speak with a [facility] Pharmacist regarding Metformin side effects and community resources for  financial assistance for Progesterone.  Patient states all of her medications except for Progesterone are currently covered by her [employer] plan.   RNCM advised patient that [facility] Pharmacist and [place] would call patient within the next 10 business days or when patient returns to [facility] to begin [place] Management services. RNCM verbally gave patient [facility] 24 hour Nurse Advice line phone number and [facility] main office number.      Objective: Per Epic chart review, no inpatient admissions noted for 2016.   Assessment: Received [facility] HMO Tier 4 list  referral. Diagnosis listed is DM. Patient has had no ED visits and had 3 admits.   Patient needing diabetes education, pharmacy question follow up and community resources.  Patient currently has no care coordination needs for telephonic RNCM.    Plan: RNCM will refer patient to [employer] for diabetes education and community resources. RNCM will refer patient to [facility] Pharmacist for metformin questions,medication review and progesterone community resources.  RNCM will send request to [place] to change patient's work number to cell phone number in [place].    Dannilynn Gallina H. Annia Friendly, BSN, [name] Telephonic CM Phone: [phone]

## 2015-03-26 DIAGNOSIS — H5210 Myopia, unspecified eye: Secondary | ICD-10-CM | POA: Diagnosis not present

## 2015-03-26 DIAGNOSIS — H524 Presbyopia: Secondary | ICD-10-CM | POA: Diagnosis not present

## 2015-03-26 DIAGNOSIS — E113393 Type 2 diabetes mellitus with moderate nonproliferative diabetic retinopathy without macular edema, bilateral: Secondary | ICD-10-CM | POA: Diagnosis not present

## 2015-03-29 ENCOUNTER — Other Ambulatory Visit: Payer: Self-pay | Admitting: Pharmacist

## 2015-03-29 NOTE — Patient Outreach (Addendum)
Sherry [name] is a 81 y.o. female referred to pharmacy for medication questions and cost concerns related to her metformin and progesterone. Call and speak with [name] who reports that she has no questions/concerns related to her metformin. However, reports that her progesterone is costly. Reports that she pays about $100 each time she has this filled. Note that patient has coverage through [facility]. Per [employer] formulary, her progesterone appears to be a tier 3 medication for her. Confirm that patient is receiving her medications through [place] for maximum savings. Explain to patient how the tiers of insurance work. Advise patient to discuss with her physician whether there is an alternative that she could use. Patient reports that she is cannot remember what she is taking this medication for, but that she has an appointment with her gynecologist this Thursday. Patient reports that she will discuss alternatives with her gynecologist at that time.  Patient reports that she has no further questions for me at this time. Provide patient with my phone number in case she has questions in the future. Will close pharmacy episode for now.  Harlow Asa, PharmD Clinical Pharmacist [place] Management [phone]

## 2015-04-01 ENCOUNTER — Other Ambulatory Visit: Payer: Self-pay | Admitting: Obstetrics and Gynecology

## 2015-04-01 DIAGNOSIS — Z1211 Encounter for screening for malignant neoplasm of colon: Secondary | ICD-10-CM | POA: Diagnosis not present

## 2015-04-01 DIAGNOSIS — N907 Vulvar cyst: Secondary | ICD-10-CM | POA: Diagnosis not present

## 2015-04-01 DIAGNOSIS — Z1231 Encounter for screening mammogram for malignant neoplasm of breast: Secondary | ICD-10-CM

## 2015-04-01 DIAGNOSIS — Z8619 Personal history of other infectious and parasitic diseases: Secondary | ICD-10-CM | POA: Diagnosis not present

## 2015-04-01 DIAGNOSIS — Z01419 Encounter for gynecological examination (general) (routine) without abnormal findings: Secondary | ICD-10-CM | POA: Diagnosis not present

## 2015-04-01 DIAGNOSIS — Z124 Encounter for screening for malignant neoplasm of cervix: Secondary | ICD-10-CM | POA: Diagnosis not present

## 2015-04-12 ENCOUNTER — Ambulatory Visit: Payer: Commercial Managed Care - HMO | Attending: Obstetrics and Gynecology

## 2015-04-15 DIAGNOSIS — M5416 Radiculopathy, lumbar region: Secondary | ICD-10-CM | POA: Diagnosis not present

## 2015-04-15 DIAGNOSIS — M5136 Other intervertebral disc degeneration, lumbar region: Secondary | ICD-10-CM | POA: Diagnosis not present

## 2015-04-15 DIAGNOSIS — M4806 Spinal stenosis, lumbar region: Secondary | ICD-10-CM | POA: Diagnosis not present

## 2015-04-16 ENCOUNTER — Other Ambulatory Visit: Payer: Self-pay | Admitting: *Deleted

## 2015-04-16 ENCOUNTER — Other Ambulatory Visit: Payer: Self-pay

## 2015-04-16 NOTE — Patient Outreach (Signed)
[facility]) Care Management  04/16/2015  Sherry [name] 06-07-1934 [id number]  Inbound voice message received from patient stating she is currently staying with her niece and can be reached at her cell # [phone].    RN CM updated [place] of this update.   Mariann Laster, RN, BSN, [facility], CCM  [facility] [employer] Management Coordinator [phone] Direct [phone] Cell [phone] Office [phone] Fax

## 2015-04-16 NOTE — Patient Outreach (Addendum)
[facility]) Care Management  04/16/2015  Sherry [name] April 15, 1934 [id number]   RN Health Coach   1st attempted introduction  outreach call to patient.  Patient was unavailable. Hipaa compliance voicemail message left with call back number.[place] Care Management [phone]

## 2015-04-19 ENCOUNTER — Other Ambulatory Visit: Payer: Self-pay | Admitting: *Deleted

## 2015-04-19 NOTE — Patient Outreach (Signed)
[facility] [facility]) Care Management  04/19/2015  Sherry [name] [birth year] [id number]  Subjective: Received voicemail message today from patient, message left on 04/14/15.   Patient states she is currently staying at niece's home.  Patient request text message to be sent to her cell phone [phone]) to establish contact.  RNCM advised [place] of above voicemail.  Plan: [place] will follow up with patient per her request.  Joshue Badal H. Annia Friendly, BSN, [place] Management [facility] Telephonic CM Phone: [phone] Fax: [phone]

## 2015-04-19 NOTE — Patient Outreach (Signed)
[facility] [facility]) Care Management  04/19/2015  Sherry [name] 10/05/1934 [id number]   RN Health Coach  Attempted  2nd introductory  outreach call to patient.  Patient was unavailable. Hipaa Compliance voic mail message left with call back number. [place] Care Management [phone]

## 2015-04-21 DIAGNOSIS — H6123 Impacted cerumen, bilateral: Secondary | ICD-10-CM | POA: Diagnosis not present

## 2015-04-21 DIAGNOSIS — R42 Dizziness and giddiness: Secondary | ICD-10-CM | POA: Diagnosis not present

## 2015-04-21 DIAGNOSIS — H903 Sensorineural hearing loss, bilateral: Secondary | ICD-10-CM | POA: Diagnosis not present

## 2015-04-22 ENCOUNTER — Ambulatory Visit
Admission: RE | Admit: 2015-04-22 | Discharge: 2015-04-22 | Disposition: A | Discharge status: Home / Self Care | Payer: Commercial Managed Care - HMO | Source: Ambulatory Visit | Attending: Obstetrics and Gynecology | Admitting: Obstetrics and Gynecology

## 2015-04-22 ENCOUNTER — Other Ambulatory Visit: Payer: Self-pay | Admitting: Obstetrics and Gynecology

## 2015-04-22 DIAGNOSIS — Z1231 Encounter for screening mammogram for malignant neoplasm of breast: Secondary | ICD-10-CM | POA: Diagnosis not present

## 2015-04-27 ENCOUNTER — Other Ambulatory Visit: Payer: Self-pay | Admitting: *Deleted

## 2015-04-27 ENCOUNTER — Encounter: Payer: Self-pay | Admitting: *Deleted

## 2015-04-27 NOTE — Patient Outreach (Signed)
[place] [facility]) Care Management  [place]  04/27/2015  Sherry [name] 1935-01-16 [id number]  Subjective: RN Health Coach received  telephone call from  patient.  Hipaa compliance verified. Per patient she has been away out of [place]. Patient blood sugar is 140 after breakfast. Per patient she checks her blood sugar once a day. Per patient she is able to cook for herself. She is unable to clean her house. Patient has a niece that is her caregiver. Patient lives alone. Per patient she is having some issues with her balance. She stated she has the tendency to weave to the left. She went to Dr Gloriann Loan her eye Dr and was referred to an ENT. The ENT cleaned her ear out and the balance was somewhat  Better. She is to go back to the ENT on 04/30/2015. Per patient she has exercise equipment but is having difficulty with being off balance to utilize it. Patient has to hold on to railings to climb steps. Patient has fallen once around Thanksgiving while killing some bats with a tennis racket.   She has a walker and a cane that she uses. Per patient her appetite is good. Patient has a history of enlarged thyroid in which she had radiation. Patient has agreed to follow up outreach calls.   Objective:   Current Medications:  Current Outpatient Prescriptions  Medication Sig Dispense Refill  . losartan (COZAAR) 25 MG tablet Take 25 mg by mouth daily.    . metFORMIN (GLUCOPHAGE) 500 MG tablet Take 500 mg by mouth 2 (two) times daily with a meal.    . montelukast (SINGULAIR) 10 MG tablet Take 10 mg by mouth every morning.    . progesterone (PROMETRIUM) 200 MG capsule Take 200 mg by mouth daily.    . vitamin B-12 (CYANOCOBALAMIN) 1000 MCG tablet Take 1,000 mcg by mouth daily.     No current facility-administered medications for this visit.    Functional Status:  In your present state of health, do you have any difficulty performing the following activities: 04/27/2015 04/27/2015  Hearing?  - N  Vision? N -  Difficulty concentrating or making decisions? N -  Walking or climbing stairs? (No Data) Y  Dressing or bathing? N -  Doing errands, shopping? Y -  Conservation officer, nature and eating ? N -  Using the Toilet? N -  In the past six months, have you accidently leaked urine? N -  Do you have problems with loss of bowel control? N -  Managing your Medications? N -  Managing your Finances? N -  Housekeeping or managing your Housekeeping? Y N    Fall/Depression Screening: PHQ 2/9 Scores 04/27/2015 03/16/2015  PHQ - 2 Score 0 0    Assessment: Knowledge deficit in self management of Diabetes Patient will benefit from [place] telephonic outreach for education and support for diabetes self management  Patient is a retired [employer] manager and feels she will be able to use the blood pressure monitoring device without any additional instructions THN CM Care Plan Problem One        Most Recent Value   Care Plan Problem One  knowledge deficit in self management of diabetes   Role Documenting the Problem One  [place] for Problem One  Active   THN Long Term Goal (31-90 days)  Patient will not have any admissions for diabetes in 90 days   THN Long Term Goal Start Date  04/27/15   Interventions  for Problem One [place] coach discussed ways to control your blood sugars. RN discussed what  blood sugar range should be. RN will send EMMI information to patient on Diabetes   THN CM Short Term Goal #1 (0-30 days)  Patient will be able to verbalize signs and symptoms of hypo and hyperglycemia in 30 days   THN CM Short Term Goal #1 Start Date  04/27/15   Interventions for Short Term Goal #1  RN discussed with patient initially the symptoms of hypo and hyper glycemia. RN will send patient picture charts of hypo and hyper glycemia. RN will send EMMI information on Hypo and hyper glycemia self care.   THN CM Short Term Goal #2 (0-30 days)  Patient will be able to verbalize what  the Meaning of HGB A1C is and how it affects her care   [facility] CM Short Term Goal #2 Start Date  04/27/15   Interventions for Short Term Goal #2  Patient stated what she didn't  remember what her last A1C was. RN will send EMMI information on Why get your A1c checked   THN CM Short Term Goal #3 (0-30 days)  Patient will be able to verbalize  how diabetes affects her blood pressure in 30 days   THN CM Short Term Goal #3 Start Date  04/27/15   Interventions for Short Tern Goal #3  Patient will report what her documented blood pressure is. Patient will document in the calendar book that RN health coach send her blood pressure and blood sugar. RN will send patient a blood Pressure Monitoring set/      Plan:  RN sent patient a welcome packet and a consent to sign and return [place]  will follow up outreach with a month RN will send patient a blood pressure monitoring device and follow up on usage(Per patient she will call if any difficulty using device) RN sent EMMI on how diabetes affect the blood pressure RN sent EMMI on Why get you A1C checked RN sent EMMI on When to check your blood sugar RN sent EMMI on Diabetes type 2 meal planning RN sent a Carb counting chart RN sent EMMI information on Hypo and Hyperglycemia symptoms and action plan RN sent 2017 calendar book for documentation  [name], BSN, [place] Management RN Health Coach Phone: [place] complies with applicable Federal civil rights laws and does not discriminate on the basis of race, color, national origin, age, disability, or sex. Espaol (Spanish)  [place] cumple con las leyes federales de derechos civiles aplicables y no discrimina por motivos de raza, color, nacionalidad, edad, discapacidad o sexo.    [place] ([place])  [place] tun th? lu?t dn quy?n hi?n hnh c?a Lin bang v khng phn bi?t ?i x? d?a trn ch?ng t?c, mu  da, ngu?n g?c qu?c gia, ? tu?i, khuy?t t?t, ho?c gi?i tnh.    (Arabic)    [place]                      .

## 2015-04-28 ENCOUNTER — Encounter: Payer: Self-pay | Admitting: *Deleted

## 2015-05-07 DIAGNOSIS — M170 Bilateral primary osteoarthritis of knee: Secondary | ICD-10-CM | POA: Insufficient documentation

## 2015-05-07 DIAGNOSIS — M25562 Pain in left knee: Secondary | ICD-10-CM | POA: Diagnosis not present

## 2015-05-07 DIAGNOSIS — M25561 Pain in right knee: Secondary | ICD-10-CM | POA: Diagnosis not present

## 2015-05-10 DIAGNOSIS — R42 Dizziness and giddiness: Secondary | ICD-10-CM | POA: Diagnosis not present

## 2015-05-20 ENCOUNTER — Ambulatory Visit: Payer: Commercial Managed Care - HMO

## 2015-05-20 ENCOUNTER — Other Ambulatory Visit: Payer: Self-pay | Admitting: Otolaryngology

## 2015-05-20 DIAGNOSIS — R42 Dizziness and giddiness: Secondary | ICD-10-CM

## 2015-05-24 ENCOUNTER — Encounter: Payer: Self-pay | Admitting: *Deleted

## 2015-05-24 ENCOUNTER — Other Ambulatory Visit: Payer: Self-pay | Admitting: *Deleted

## 2015-05-24 ENCOUNTER — Ambulatory Visit: Payer: Self-pay | Admitting: *Deleted

## 2015-05-24 NOTE — Patient Outreach (Signed)
[facility]) Care Management  05/24/2015  COPELAND GAUDREAULT 1934/12/26 [id number]   RN Health attempted #1  follow up  outreach call to patient.  Patient was unavailable. HIPPA compliance voicemail message was left with return callback number.    [name], BSN, RN [facility] Care Management RN Health Coach Phone: [place] complies with applicable Federal civil rights laws and does not discriminate on the basis of race, color, national origin, age, disability, or sex. Espaol (Spanish)  [place] cumple con las leyes federales de derechos civiles aplicables y no discrimina por motivos de raza, color, nacionalidad, edad, discapacidad o sexo.    [place]  [place] tun th? lu?t dn quy?n hi?n hnh c?a Lin bang v khng phn bi?t ?i x? d?a trn ch?ng t?c, mu da, ngu?n g?c qu?c gia, ? tu?i, khuy?t t?t, ho?c gi?i tnh.    (Arabic)    [place]                      .

## 2015-05-24 NOTE — Patient Outreach (Signed)
[facility]) Care Management  [place]  05/24/2015   Sherry [name] March 06, 1935 [id number]  Subjective RN Health Coach received  telephone callback from patient.  Hipaa compliance verified. Per patient she has been having  headaches. She is getting some testing done. She is having a MRI of the brain. Patient gait is still unsteady. Patient is ambulating with a walker. Per patient she is afraid to go to an exercise class alone due to the fear of falling.  Per patient after the testing she is going to the [facility] for the silver sneakers exercise class. Per patient she has not checked her sugar today. Per patient her blood sugar was 190 yesterday after eating. Patient stated it is usually 140 before eating.  RN discussed with patient about taking her blood sugar before she eats rather than afterwards. RN explained to patient about getting her blood sugar under control.  Per patient stated that she eats a lot of food. She stated she doesn't" eat fried foods as much.  Per patient she stated that what she really needs are bathroom railing in her bathroom.  RN told patient she will contact social worker to see if there are any organizations that may help her. Patient does owns her own home so there is no landlord to ask.:   Objective:   Current Medications:  Current Outpatient Prescriptions  Medication Sig Dispense Refill  . losartan (COZAAR) 25 MG tablet Take 25 mg by mouth daily.    . metFORMIN (GLUCOPHAGE) 500 MG tablet Take 500 mg by mouth 2 (two) times daily with a meal.    . montelukast (SINGULAIR) 10 MG tablet Take 10 mg by mouth every morning.    . progesterone (PROMETRIUM) 200 MG capsule Take 200 mg by mouth daily.    . vitamin B-12 (CYANOCOBALAMIN) 1000 MCG tablet Take 1,000 mcg by mouth daily.     No current facility-administered medications for this visit.    Functional Status:  In your present state of health, do you have any difficulty performing the following  activities: 05/24/2015 04/27/2015  Hearing? N -  Vision? N N  Difficulty concentrating or making decisions? N N  Walking or climbing stairs? Y (No Data)  Dressing or bathing? N N  Doing errands, shopping? Y Y  Conservation officer, nature and eating ? - N  Using the Toilet? - N  In the past six months, have you accidently leaked urine? - N  Do you have problems with loss of bowel control? - N  Managing your Medications? - N  Managing your Finances? - N  Housekeeping or managing your Housekeeping? - Y    Fall/Depression Screening: PHQ 2/9 Scores 05/24/2015 04/27/2015 03/16/2015  PHQ - 2 Score 1 0 0   THN CM Care Plan Problem One        Most Recent Value   Care Plan Problem One  knowledge deficit in self management of diabetes   Role Documenting the Problem One  [place] for Problem One  Active   THN Long Term Goal (31-90 days)  Patient will not have any admissions for diabetes in 90 days   THN Long Term Goal Start Date  04/27/15   Interventions for Problem One [place] coach discussed ways to control your blood sugars. RN discussed what  blood sugar range should be. RN will send EMMI information to patient on Diabetes   THN CM Short Term Goal #1 (0-30  days)  Patient will be able to verbalize signs and symptoms of hypo and hyperglycemia in 30 days   THN CM Short Term Goal #1 Start Date  04/27/15   Interventions for Short Term Goal #1  RN discussed with patient initially the symptoms of hypo and hyper glycemia. RN will send patient picture charts of hypo and hyper glycemia. RN will send EMMI information on Hypo and hyper glycemia self care.   THN CM Short Term Goal #2 (0-30 days)  Patient will be able to verbalize what the Meaning of HGB A1C is and how it affects her care   [facility] CM Short Term Goal #2 Start Date  04/27/15 [Unmet Patient needs more education on how unstable blood sugars affect her A1c]   Interventions for Short Term Goal #2  Patient stated what she didn't   remember what her last A1C was. RN will send EMMI information on Why get your A1c checked / RN will send educational material on how the A1C relates to her daily glucose readings.          THN CM Short Term Goal #3 (0-30 days)  Patient will be able to verbalize  how diabetes affects her blood pressure in 30 days   THN CM Short Term Goal #3 Start Date  04/27/15 [Unmet. Patient needs continue education to take her BP and document the readings]   Interventions for Short Tern Goal #3  Patient will report what her documented blood pressure is. Patient will document in the calendar book that RN health coach send her blood pressure and blood sugar. RN will send patient a blood Pressure Monitoring set/      Assessment:  Patient will  Continue to benefit from Health Coach telephonic outreach for education and support of diabetes management.  Plan:  RN Health Coach contacted social worker and notified patient of 2 organizations that may be of assistance for her to contact with numbers. RN will send patient educational information on How her A1C relates to her daily glucose readings RN will send information on How the A1C tests help, her goal and what the test is. Knowing her goal numbers of her blood glucose, blood pressure and cholesterol and blood lipids RN will send patient  EMMI educational material on heart failure and stroke RN will send more information on creating a meal plan What she can have as a snack and a menu of very low carb snacks  [name], BSN, [place] Coach Phone: [place] complies with applicable Federal civil rights laws and does not discriminate on the basis of race, color, national origin, age, disability, or sex. Espaol (Spanish)  [place] cumple con las leyes federales de derechos civiles aplicables y no discrimina por motivos de raza, color, nacionalidad, edad, discapacidad o sexo.     [place]  [place] tun th? lu?t dn quy?n hi?n hnh c?a Lin bang v khng phn bi?t ?i x? d?a trn ch?ng t?c, mu da, ngu?n g?c qu?c gia, ? tu?i, khuy?t t?t, ho?c gi?i tnh.    (Arabic)    [place]                      .

## 2015-05-28 DIAGNOSIS — M5030 Other cervical disc degeneration, unspecified cervical region: Secondary | ICD-10-CM | POA: Diagnosis not present

## 2015-05-28 DIAGNOSIS — M4806 Spinal stenosis, lumbar region: Secondary | ICD-10-CM | POA: Diagnosis not present

## 2015-05-28 DIAGNOSIS — M7061 Trochanteric bursitis, right hip: Secondary | ICD-10-CM | POA: Diagnosis not present

## 2015-05-28 DIAGNOSIS — M5412 Radiculopathy, cervical region: Secondary | ICD-10-CM | POA: Diagnosis not present

## 2015-05-28 DIAGNOSIS — M5136 Other intervertebral disc degeneration, lumbar region: Secondary | ICD-10-CM | POA: Diagnosis not present

## 2015-05-28 DIAGNOSIS — M5416 Radiculopathy, lumbar region: Secondary | ICD-10-CM | POA: Diagnosis not present

## 2015-05-28 DIAGNOSIS — M7541 Impingement syndrome of right shoulder: Secondary | ICD-10-CM | POA: Diagnosis not present

## 2015-06-09 ENCOUNTER — Ambulatory Visit
Admission: RE | Admit: 2015-06-09 | Discharge: 2015-06-09 | Disposition: A | Discharge status: Home / Self Care | Payer: Commercial Managed Care - HMO | Source: Ambulatory Visit | Attending: Otolaryngology | Admitting: Otolaryngology

## 2015-06-09 ENCOUNTER — Other Ambulatory Visit
Admission: RE | Admit: 2015-06-09 | Discharge: 2015-06-09 | Disposition: A | Discharge status: Home / Self Care | Payer: Commercial Managed Care - HMO | Source: Ambulatory Visit | Attending: Otolaryngology | Admitting: Otolaryngology

## 2015-06-09 DIAGNOSIS — J322 Chronic ethmoidal sinusitis: Secondary | ICD-10-CM | POA: Diagnosis not present

## 2015-06-09 DIAGNOSIS — G319 Degenerative disease of nervous system, unspecified: Secondary | ICD-10-CM | POA: Insufficient documentation

## 2015-06-09 DIAGNOSIS — J323 Chronic sphenoidal sinusitis: Secondary | ICD-10-CM | POA: Insufficient documentation

## 2015-06-09 DIAGNOSIS — R42 Dizziness and giddiness: Secondary | ICD-10-CM | POA: Insufficient documentation

## 2015-06-09 DIAGNOSIS — R9082 White matter disease, unspecified: Secondary | ICD-10-CM | POA: Diagnosis not present

## 2015-06-09 DIAGNOSIS — Z01812 Encounter for preprocedural laboratory examination: Secondary | ICD-10-CM | POA: Insufficient documentation

## 2015-06-09 LAB — CREATININE, SERUM
CREATININE: 1.03 mg/dL — AB (ref 0.44–1.00)
GFR calc non Af Amer: 50 mL/min — ABNORMAL LOW (ref >60)
GFR, EST AFRICAN AMERICAN: 58 mL/min — AB (ref >60)

## 2015-06-09 LAB — BUN: BUN: 18 mg/dL (ref 6–20)

## 2015-06-09 MED ORDER — GADOBENATE DIMEGLUMINE 529 MG/ML IV SOLN
20.0000 mL | Freq: Once | INTRAVENOUS | Status: AC | PRN
  Administered 2015-06-09: 20 mL via INTRAVENOUS

## 2015-06-22 DIAGNOSIS — E1122 Type 2 diabetes mellitus with diabetic chronic kidney disease: Secondary | ICD-10-CM | POA: Diagnosis not present

## 2015-06-22 DIAGNOSIS — I10 Essential (primary) hypertension: Secondary | ICD-10-CM | POA: Diagnosis not present

## 2015-06-22 DIAGNOSIS — E7800 Pure hypercholesterolemia, unspecified: Secondary | ICD-10-CM | POA: Diagnosis not present

## 2015-06-22 DIAGNOSIS — N183 Chronic kidney disease, stage 3 (moderate): Secondary | ICD-10-CM | POA: Diagnosis not present

## 2015-06-22 DIAGNOSIS — Z0000 Encounter for general adult medical examination without abnormal findings: Secondary | ICD-10-CM | POA: Diagnosis not present

## 2015-06-23 ENCOUNTER — Ambulatory Visit: Payer: Self-pay | Admitting: *Deleted

## 2015-06-23 ENCOUNTER — Other Ambulatory Visit: Payer: Self-pay | Admitting: *Deleted

## 2015-06-23 NOTE — Patient Outreach (Signed)
[place] [facility]) Care Management  06/23/2015  ALEXANDR BAUTZ [birth year] [id number]  RN Health Coach attempted #1  Follow up outreach call to patient.  Patient was unavailable. HIPPA compliance voicemail message was left with return callback number.    [place] Care Management [phone]

## 2015-06-25 DIAGNOSIS — M25562 Pain in left knee: Secondary | ICD-10-CM | POA: Diagnosis not present

## 2015-06-25 DIAGNOSIS — M25561 Pain in right knee: Secondary | ICD-10-CM | POA: Diagnosis not present

## 2015-06-25 DIAGNOSIS — M170 Bilateral primary osteoarthritis of knee: Secondary | ICD-10-CM | POA: Diagnosis not present

## 2015-07-09 ENCOUNTER — Encounter: Payer: Self-pay | Admitting: *Deleted

## 2015-07-09 ENCOUNTER — Other Ambulatory Visit: Payer: Self-pay | Admitting: *Deleted

## 2015-07-09 NOTE — Patient Outreach (Addendum)
[facility]) Care Management  [place]  07/09/2015   Sherry [name] 1935-02-19 [id number]  Subjective: Telephone call to patient's home number, spoke with patient, and HIPAA verified.   Patient states she is feeling much better.  States she had a steroid shot in knee on 06/25/15 and has had more elevated blood sugars since shot. States she is currently not on insulin and has refused insulin in the past.   RNCM educated patient on how steroids can affect blood sugars.  Patient voiced understanding and states her blood sugar is now back to her baseline range 130 - 160.  States her target blood sugar range with previous primary MD was 120 and has not been given a target blood sugar with current primary MD.   States her blood sugar last week was 282 and it is 134 today.   States she was getting concerned about the high sugars and is aware of signs/ symptoms that she will need to call MD for in the future.  States her A1C was 7.4 and still elevated on 06/22/15.  States she is due for the next A1C recheck in August of 2017.   States she does not have typical signs and symptoms of hypoglycemia and hyperglycemia (e.g. Shaky and sweating).   Patient states her symptoms are more related to degree of dizziness with both hypoglycemia and hyperglycemia.  Patient states the dizziness and unbalance gait she was experiencing after last hospitalization is gotten much better and is almost gone.  States she recently saw ENT regarding the dizziness and was told it was related to previous surgery.  States her kidney function is better, per last MD visit, and is not aware of kidney function numbers.  RNCM educated patient on the importance of knowing your kidney function numbers for disease management.  Patient voices understanding and states she will follow up with MD to get a copy of her kidney function numbers.   Patient states she is recording her blood pressure and blood sugars.    Patient states  her blood pressure prior to blood pressure medicine today was 180 /90 and 140 /70 post medication.   Patient states she is aware of signs and symptoms to report to MD.    Patient states she is interested in starting back on nutritional supplements (Glucerna), was on them in the past, stopped due to finances,  and is interested in receiving discount coupons.  Patient states she has received, reviewed, and has no question regarding educational material that she received from [employer], since last conversation. Patient states she has followed up on the grab bar community resources, the measurements completed, and is awaiting further follow up from vendor.   RNCM advised patient to follow up with vendor for an update on next steps.   Patient voices understanding and states she will follow up. Patient states she has not received [facility] Care Management written consent and requested it to be resent, in the event it may have been misplaced.  Patient in agreement to continue to receive [facility] Management services.   Objective:   Encounter Medications:  Outpatient Encounter Prescriptions as of 07/09/2015  Medication Sig  . losartan (COZAAR) 25 MG tablet Take 25 mg by mouth daily.  . metFORMIN (GLUCOPHAGE) 500 MG tablet Take 500 mg by mouth 2 (two) times daily with a meal.  . montelukast (SINGULAIR) 10 MG tablet Take 10 mg by mouth every morning.  . progesterone (PROMETRIUM) 200 MG  capsule Take 200 mg by mouth daily.  . vitamin B-12 (CYANOCOBALAMIN) 1000 MCG tablet Take 1,000 mcg by mouth daily.   No facility-administered encounter medications on file as of 07/09/2015.    Functional Status:  In your present state of health, do you have any difficulty performing the following activities: 05/24/2015 04/27/2015  Hearing? N -  Vision? N N  Difficulty concentrating or making decisions? N N  Walking or climbing stairs? Y (No Data)  Dressing or bathing? N N  Doing errands, shopping? Y Y   Conservation officer, nature and eating ? - N  Using the Toilet? - N  In the past six months, have you accidently leaked urine? - N  Do you have problems with loss of bowel control? - N  Managing your Medications? - N  Managing your Finances? - N  Housekeeping or managing your Housekeeping? - Y    Fall/Depression Screening: PHQ 2/9 Scores 05/24/2015 04/27/2015 03/16/2015  PHQ - 2 Score 1 0 0   Fall Risk  07/09/2015 05/24/2015 04/27/2015  Falls in the past year? Yes Yes Yes  Number falls in past yr: 1 1 1   Injury with Fall? No No No  Risk for fall due to : Impaired balance/gait;History of fall(s);Impaired mobility Impaired balance/gait;Impaired mobility;History of fall(s) Impaired balance/gait;Impaired mobility  Follow up Falls evaluation completed;Education provided Falls evaluation completed;Education provided Falls evaluation completed;Education provided    Assessment: Patient willcontinue toe receive Health Coach telephonic outreach for education and support of diabetes management.  Plan: RN Health Coach will send patient Glucerna coupons and [facility] Care Management consent per request.  Clio will call patient within 1 month for diabetes disease management follow up.  RN Health Coach will verify with patient receipt of Glucerna coupons within 1 month. RN Health Coach will verify status of patient's grab bar installation within 1 month.    Garlin Batdorf H. Annia Friendly, BSN, [facility] Telephonic CM Phone: [phone] Fax: [phone]

## 2015-08-06 ENCOUNTER — Encounter: Payer: Self-pay | Admitting: *Deleted

## 2015-08-06 ENCOUNTER — Ambulatory Visit: Payer: Self-pay | Admitting: *Deleted

## 2015-08-06 ENCOUNTER — Other Ambulatory Visit: Payer: Self-pay | Admitting: *Deleted

## 2015-08-06 NOTE — Patient Outreach (Signed)
[facility]) Care Management  [place]  08/06/2015   Sherry [name] [birth year] [id number]  Subjective: RN Health Coach telephone call to patient.  [employer] compliance verified. Per patient stated, "on getting her handrails she was told that this time of year is slow for donations and they are waiting on the donations to come in." This morning blood sugar 119. Patient stated that the cortisone shots had her been  running the blood sugars up. States  Per patient her blood sugar  went up as high as 495.  RN asked patient if she had notified the Dr. Per patient no she didn't. She stated she eats vegetables for a few days and it comes back down. RN recommended that patient make the physician aware.Patient is still refusing insulin.   Patient has not had any falls. Patient states that if she feels dizzy she sits down. She had not driven any because she feels a little dizzy.Patient stated her pulse went up to 98-91.  BP 171/86. When her  blood sugar was high.   Patient has received the glucerna coupons.Patient is now eating a lite vegetable diet. She is not cooking her family is bringing her food. Patient is wanting more glucerna coupons.  Patient stated she has poor eating habits in the morning and a lot of times does not eat till around 11 am. RN explained that if patient is not eating breakfast she can checking her fasting blood sugar and drink a glucerna till she is ready to eat.  Per patient stated that she feels pressure behind her eyes at time. The optometrist per patient stated on exam didn't find anything. RN Health Coach suggested that she might want to see an opthalmologist if she feels this is a problem.  Patient has agreed to follow up outreach calls and education. Patient had questions about what the consent is for. Per patient she will fill it out and send it in.   Objective:   Encounter Medications:  Outpatient Encounter Prescriptions as of 08/06/2015  Medication Sig   . losartan (COZAAR) 25 MG tablet Take 25 mg by mouth daily.  . metFORMIN (GLUCOPHAGE) 500 MG tablet Take 500 mg by mouth 2 (two) times daily with a meal.  . montelukast (SINGULAIR) 10 MG tablet Take 10 mg by mouth every morning.  . progesterone (PROMETRIUM) 200 MG capsule Take 200 mg by mouth daily.  . vitamin B-12 (CYANOCOBALAMIN) 1000 MCG tablet Take 1,000 mcg by mouth daily.   No facility-administered encounter medications on file as of 08/06/2015.    Functional Status:  In your present state of health, do you have any difficulty performing the following activities: 05/24/2015 04/27/2015  Hearing? N -  Vision? N N  Difficulty concentrating or making decisions? N N  Walking or climbing stairs? Y (No Data)  Dressing or bathing? N N  Doing errands, shopping? Y Y  Conservation officer, nature and eating ? - N  Using the Toilet? - N  In the past six months, have you accidently leaked urine? - N  Do you have problems with loss of bowel control? - N  Managing your Medications? - N  Managing your Finances? - N  Housekeeping or managing your Housekeeping? - Y    Fall/Depression Screening: PHQ 2/9 Scores 05/24/2015 04/27/2015 03/16/2015  PHQ - 2 Score 1 0 0   THN CM Care Plan Problem One        Most Recent Value   Care Plan Problem One  knowledge  deficit in self management of diabetes   Role Documenting the Problem One  [place] for Problem One  Active   THN Long Term Goal (31-90 days)  Patient will continue to  not have any admissions for diabetes in 90 days   THN Long Term Goal Start Date  08/06/15   Interventions for Problem One Long Term Goal  RN reminded the patient to keep her appointment with physician. RN reminded patient to take medications as prescribed. RN will  follow up  monthly.   THN CM Short Term Goal #1 (0-30 days)  Patient will be able to verbalize signs and symptoms of hypo and hyperglycemia in 30 days   THN CM Short Term Goal #4 (0-30 days)  Patient will be able to  verbalize the action plan for high and low blood sugars within the next 30 days   THN CM Short Term Goal #4 Start Date  08/06/15   Interventions for Short Term Goal #4  RN sent educational material on treating high and low blood sugar.  RN discussed the importance of also making the Dr aware when your blood sugar is real high. RN will follow up with discussion and  teachback.        Assessment:  Patient blood sugars have been uncontrolled Patient will continue to  benefit from Health Coach telephonic outreach for education and support for diabetes self management. Patient needs additional information on action plan for hypo and hyperglycemia   Plan:  RN sent patient educational material on treating high and low blood sugars.  RN reiterated that patient make physician aware when her blood sugars are hign RN sent additional glucerna coupons per patient request

## 2015-08-09 DIAGNOSIS — E119 Type 2 diabetes mellitus without complications: Secondary | ICD-10-CM | POA: Diagnosis not present

## 2015-08-09 DIAGNOSIS — E079 Disorder of thyroid, unspecified: Secondary | ICD-10-CM | POA: Diagnosis not present

## 2015-08-09 DIAGNOSIS — Z7984 Long term (current) use of oral hypoglycemic drugs: Secondary | ICD-10-CM | POA: Insufficient documentation

## 2015-08-09 DIAGNOSIS — I10 Essential (primary) hypertension: Secondary | ICD-10-CM | POA: Diagnosis present

## 2015-08-09 DIAGNOSIS — I158 Other secondary hypertension: Secondary | ICD-10-CM | POA: Insufficient documentation

## 2015-08-09 DIAGNOSIS — R002 Palpitations: Secondary | ICD-10-CM | POA: Insufficient documentation

## 2015-08-09 DIAGNOSIS — F17210 Nicotine dependence, cigarettes, uncomplicated: Secondary | ICD-10-CM | POA: Insufficient documentation

## 2015-08-09 LAB — TROPONIN I

## 2015-08-09 LAB — CBC
HCT: 32.4 % — ABNORMAL LOW (ref 35.0–47.0)
HEMOGLOBIN: 10.4 g/dL — AB (ref 12.0–16.0)
MCH: 27.5 pg (ref 26.0–34.0)
MCHC: 32.1 g/dL (ref 32.0–36.0)
MCV: 85.8 fL (ref 80.0–100.0)
PLATELETS: 335 10*3/uL (ref 150–440)
RBC: 3.78 MIL/uL — ABNORMAL LOW (ref 3.80–5.20)
RDW: 15.3 % — AB (ref 11.5–14.5)
WBC: 8.2 10*3/uL (ref 3.6–11.0)

## 2015-08-09 LAB — BASIC METABOLIC PANEL
Anion gap: 8 (ref 5–15)
BUN: 17 mg/dL (ref 6–20)
CALCIUM: 10.3 mg/dL (ref 8.9–10.3)
CHLORIDE: 105 mmol/L (ref 101–111)
CO2: 26 mmol/L (ref 22–32)
CREATININE: 1 mg/dL (ref 0.44–1.00)
GFR, EST NON AFRICAN AMERICAN: 52 mL/min — AB (ref >60)
Glucose, Bld: 139 mg/dL — ABNORMAL HIGH (ref 65–99)
Potassium: 3.8 mmol/L (ref 3.5–5.1)
SODIUM: 139 mmol/L (ref 135–145)

## 2015-08-09 NOTE — ED Notes (Addendum)
Pt in with co headache and eye pain, co left hand pain. Pt states bp at home was 140/84 and HR was low 100's.  States they decreased bp med a few months ago, also co dizziness.

## 2015-08-10 ENCOUNTER — Encounter: Payer: Self-pay | Admitting: Emergency Medicine

## 2015-08-10 ENCOUNTER — Emergency Department
Admission: EM | Admit: 2015-08-10 | Discharge: 2015-08-10 | Disposition: A | Discharge status: Home / Self Care | Payer: Commercial Managed Care - HMO | Attending: Emergency Medicine | Admitting: Emergency Medicine

## 2015-08-10 DIAGNOSIS — I158 Other secondary hypertension: Secondary | ICD-10-CM

## 2015-08-10 DIAGNOSIS — R002 Palpitations: Secondary | ICD-10-CM

## 2015-08-10 LAB — TSH: TSH: 2.715 u[IU]/mL (ref 0.350–4.500)

## 2015-08-10 LAB — TROPONIN I

## 2015-08-10 NOTE — ED Notes (Signed)
Took patient via wheelchair to her car.

## 2015-08-10 NOTE — Discharge Instructions (Signed)
Hypertension Hypertension, commonly called high blood pressure, is when the force of blood pumping through your arteries is too strong. Your arteries are the blood vessels that carry blood from your heart throughout your body. A blood pressure reading consists of a higher number over a lower number, such as 110/72. The higher number (systolic) is the pressure inside your arteries when your heart pumps. The lower number (diastolic) is the pressure inside your arteries when your heart relaxes. Ideally you want your blood pressure below 120/80. Hypertension forces your heart to work harder to pump blood. Your arteries may become narrow or stiff. Having untreated or uncontrolled hypertension can cause heart attack, stroke, kidney disease, and other problems. RISK FACTORS Some risk factors for high blood pressure are controllable. Others are not.  Risk factors you cannot control include:   Race. You may be at higher risk if you are African American.  Age. Risk increases with age.  Gender. Men are at higher risk than women before age 45 years. After age 65, women are at higher risk than men. Risk factors you can control include:  Not getting enough exercise or physical activity.  Being overweight.  Getting too much fat, sugar, calories, or salt in your diet.  Drinking too much alcohol. SIGNS AND SYMPTOMS Hypertension does not usually cause signs or symptoms. Extremely high blood pressure (hypertensive crisis) may cause headache, anxiety, shortness of breath, and nosebleed. DIAGNOSIS To check if you have hypertension, your health care provider will measure your blood pressure while you are seated, with your arm held at the level of your heart. It should be measured at least twice using the same arm. Certain conditions can cause a difference in blood pressure between your right and left arms. A blood pressure reading that is higher than normal on one occasion does not mean that you need treatment. If  it is not clear whether you have high blood pressure, you may be asked to return on a different day to have your blood pressure checked again. Or, you may be asked to monitor your blood pressure at home for 1 or more weeks. TREATMENT Treating high blood pressure includes making lifestyle changes and possibly taking medicine. Living a healthy lifestyle can help lower high blood pressure. You may need to change some of your habits. Lifestyle changes may include:  Following the DASH diet. This diet is high in fruits, vegetables, and whole grains. It is low in salt, red meat, and added sugars.  Keep your sodium intake below 2,300 mg per day.  Getting at least 30-45 minutes of aerobic exercise at least 4 times per week.  Losing weight if necessary.  Not smoking.  Limiting alcoholic beverages.  Learning ways to reduce stress. Your health care provider may prescribe medicine if lifestyle changes are not enough to get your blood pressure under control, and if one of the following is true:  You are 18-59 years of age and your systolic blood pressure is above 140.  You are 60 years of age or older, and your systolic blood pressure is above 150.  Your diastolic blood pressure is above 90.  You have diabetes, and your systolic blood pressure is over 140 or your diastolic blood pressure is over 90.  You have kidney disease and your blood pressure is above 140/90.  You have heart disease and your blood pressure is above 140/90. Your personal target blood pressure may vary depending on your medical conditions, your age, and other factors. HOME CARE INSTRUCTIONS    Have your blood pressure rechecked as directed by your health care provider.   Take medicines only as directed by your health care provider. Follow the directions carefully. Blood pressure medicines must be taken as prescribed. The medicine does not work as well when you skip doses. Skipping doses also puts you at risk for  problems.  Do not smoke.   Monitor your blood pressure at home as directed by your health care provider. SEEK MEDICAL CARE IF:   You think you are having a reaction to medicines taken.  You have recurrent headaches or feel dizzy.  You have swelling in your ankles.  You have trouble with your vision. SEEK IMMEDIATE MEDICAL CARE IF:  You develop a severe headache or confusion.  You have unusual weakness, numbness, or feel faint.  You have severe chest or abdominal pain.  You vomit repeatedly.  You have trouble breathing. MAKE SURE YOU:   Understand these instructions.  Will watch your condition.  Will get help right away if you are not doing well or get worse.   This information is not intended to replace advice given to you by your health care provider. Make sure you discuss any questions you have with your health care provider.   Document Released: 03/06/2005 Document Revised: 07/21/2014 Document Reviewed: 12/27/2012 Elsevier Interactive Patient Education 2016 [employer].  Palpitations A palpitation is the feeling that your heartbeat is irregular or is faster than normal. It may feel like your heart is fluttering or skipping a beat. Palpitations are usually not a serious problem. However, in some cases, you may need further medical evaluation. CAUSES  Palpitations can be caused by:  Smoking.  Caffeine or other stimulants, such as diet pills or energy drinks.  Alcohol.  Stress and anxiety.  Strenuous physical activity.  Fatigue.  Certain medicines.  Heart disease, especially if you have a history of irregular heart rhythms (arrhythmias), such as atrial fibrillation, atrial flutter, or supraventricular tachycardia.  An improperly working pacemaker or defibrillator. DIAGNOSIS  To find the cause of your palpitations, your health care provider will take your medical history and perform a physical exam. Your health care provider may also have you take a  test called an ambulatory electrocardiogram (ECG). An ECG records your heartbeat patterns over a 24-hour period. You may also have other tests, such as:  Transthoracic echocardiogram (TTE). During echocardiography, sound waves are used to evaluate how blood flows through your heart.  Transesophageal echocardiogram (TEE).  Cardiac monitoring. This allows your health care provider to monitor your heart rate and rhythm in real time.  Holter monitor. This is a portable device that records your heartbeat and can help diagnose heart arrhythmias. It allows your health care provider to track your heart activity for several days, if needed.  Stress tests by exercise or by giving medicine that makes the heart beat faster. TREATMENT  Treatment of palpitations depends on the cause of your symptoms and can vary greatly. Most cases of palpitations do not require any treatment other than time, relaxation, and monitoring your symptoms. Other causes, such as atrial fibrillation, atrial flutter, or supraventricular tachycardia, usually require further treatment. HOME CARE INSTRUCTIONS   Avoid:  Caffeinated coffee, tea, soft drinks, diet pills, and energy drinks.  Chocolate.  Alcohol.  Stop smoking if you smoke.  Reduce your stress and anxiety. Things that can help you relax include:  A method of controlling things in your body, such as your heartbeats, with your mind (biofeedback).  Yoga.  Meditation.  Physical activity such as swimming, jogging, or walking.  Get plenty of rest and sleep. SEEK MEDICAL CARE IF:   You continue to have a fast or irregular heartbeat beyond 24 hours.  Your palpitations occur more often. SEEK IMMEDIATE MEDICAL CARE IF:  You have chest pain or shortness of breath.  You have a severe headache.  You feel dizzy or you faint. MAKE SURE YOU:  Understand these instructions.  Will watch your condition.  Will get help right away if you are not doing well or get  worse.   This information is not intended to replace advice given to you by your health care provider. Make sure you discuss any questions you have with your health care provider.   Document Released: 03/03/2000 Document Revised: 03/11/2013 Document Reviewed: 05/05/2011 Elsevier Interactive Patient Education [employer].

## 2015-08-10 NOTE — ED Provider Notes (Addendum)
[facility] Emergency Department Provider Note   ____________________________________________  Time seen: Approximately 0028 AM  I have reviewed the triage vital signs and the nursing notes.   HISTORY  Chief Complaint Hypertension    HPI Sherry [name] is a 81 y.o. female who comes into the hospital today with a fast heart rate and noted blood pressure. The patient reports that she has a monitor at home that is brand-new. She reports that she put it on to check her blood pressure and her pulse and they were both out of whack. She reports that she checked her pulse and it was 110 and a blood pressure is 140/70. She took again 30 minutes later and her pulse was 101 and her blood pressure is 170/84. She reports that she kept checking it back and forth and the blood pressure and heart rate were all over the map. She became upset show she decided to come into the hospital to get checked out. The patient denies any chest pain, denies any dizziness, denies any shortness of breath or any other complaints. She reports that she's had a test of her heart 2 months ago and her doctor said that everything was okay. The patient also reports that she had a headache earlier today with some pain behind her eyes but she took some aspirin and the pain went away. The patient reports that she did not want to be at home and have something happen to her so she came in to get checked out.   Past Medical History  Diagnosis Date  . Diabetes mellitus without complication ([place])   . Thyroid disease   . Allergy     There are no active problems to display for this patient.   Past Surgical History  Procedure Laterality Date  . Cholecystectomy      Current Outpatient Rx  Name  Route  Sig  Dispense  Refill  . losartan (COZAAR) 25 MG tablet   Oral   Take 25 mg by mouth daily.         . metFORMIN (GLUCOPHAGE) 500 MG tablet   Oral   Take 500 mg by mouth 2 (two) times daily with  a meal.         . montelukast (SINGULAIR) 10 MG tablet   Oral   Take 10 mg by mouth every morning.         . progesterone (PROMETRIUM) 200 MG capsule   Oral   Take 200 mg by mouth daily.         . vitamin B-12 (CYANOCOBALAMIN) 1000 MCG tablet   Oral   Take 1,000 mcg by mouth daily.           Allergies Penicillins  Family History  Problem Relation Age of Onset  . Diabetes Mother   . Hypertension Mother   . Hypertension Father   . Cancer Brother     Social History Social History  Substance Use Topics  . Smoking status: Current Every Day Smoker    Types: Cigarettes  . Smokeless tobacco: None  . Alcohol Use: None    Review of Systems Constitutional: No fever/chills Eyes: No visual changes. ENT: No sore throat. Cardiovascular: Denies chest pain. Respiratory: Denies shortness of breath. Gastrointestinal: No abdominal pain.  No nausea, no vomiting.  No diarrhea.  No constipation. Genitourinary: Negative for dysuria. Musculoskeletal: Negative for back pain. Skin: Negative for rash. Neurological: Headache  10-point ROS otherwise negative.  ____________________________________________   PHYSICAL EXAM:  VITAL SIGNS: ED Triage Vitals  Enc Vitals Group     BP 08/09/15 2028 148/91 mmHg     Pulse Rate 08/09/15 2028 89     Resp 08/09/15 2028 18     Temp 08/09/15 2028 86.6 F (30.3 C)     Temp Source 08/09/15 2028 Oral     SpO2 08/09/15 2028 98 %     Weight 08/09/15 2028 182 lb (82.555 kg)     Height 08/09/15 2028 5\' 5"  (1.651 m)     Head Cir --      Peak Flow --      Pain Score 08/09/15 2029 2     Pain Loc --      Pain Edu? --      Excl. in [place]? --     Constitutional: Alert and oriented. Well appearing and in no acute distress. Eyes: Conjunctivae are normal. PERRL. EOMI. Head: Atraumatic. Nose: No congestion/rhinnorhea. Mouth/Throat: Mucous membranes are moist.  Oropharynx non-erythematous. Cardiovascular: Normal rate, regular rhythm. Grossly  normal heart sounds.  Good peripheral circulation. Respiratory: Normal respiratory effort.  No retractions. Lungs CTAB. Gastrointestinal: Soft and nontender. No distention. Positive bowel sounds Musculoskeletal: No lower extremity tenderness nor edema.  . Neurologic:  Normal speech and language.  Skin:  Skin is warm, dry and intact.  Psychiatric: Mood and affect are normal.   ____________________________________________   LABS (all labs ordered are listed, but only abnormal results are displayed)  Labs Reviewed  CBC - Abnormal; Notable for the following:    RBC 3.78 (*)    Hemoglobin 10.4 (*)    HCT 32.4 (*)    RDW 15.3 (*)    All other components within normal limits  BASIC METABOLIC PANEL - Abnormal; Notable for the following:    Glucose, Bld 139 (*)    GFR calc non Af Amer 52 (*)    All other components within normal limits  TROPONIN I  TROPONIN I  TSH   ____________________________________________  EKG  ED ECG REPORT I, Loney Hering, the attending physician, personally viewed and interpreted this ECG.   Date: 08/09/2015  EKG Time: 2034  Rate: 80  Rhythm: normal sinus rhythm, PAC's noted  Axis: left bundle branch block  Intervals:right bundle branch block and left anterior fascicular block  ST&T Change: flipped t waves avl, V1, V2, V3  ____________________________________________  RADIOLOGY  None ____________________________________________   PROCEDURES  Procedure(s) performed: None  Critical Care performed: No  ____________________________________________   INITIAL IMPRESSION / ASSESSMENT AND PLAN / ED COURSE  Pertinent labs & imaging results that were available during my care of the patient were reviewed by me and considered in my medical decision making (see chart for details).  This is a 81 year old female who comes into the hospital today with some elevated blood pressure and some palpitations. The patient denies any chest pain denies  any shortness of breath denies any dizziness or lightheadedness. The patient had an initial troponin done as well as some blood work that were unremarkable. She had asked for [place] to check her TSH which we did attempt to evaluate. The patient's blood pressure continued to come down without any difficulty. She did not get any medicines here. The patient thinks that her machine may be faulty. I informed her that although her TSH is not return we will have her follow-up with her doctor who can follow-up her TSH. I will discharge the patient to home and have her follow-up with Dr. Ouida Sills in the  office. The patient agrees with this plan as stated.  The patient's initial temperature was erroneous. We repeated the patient's temperature and it was normal. ____________________________________________   FINAL CLINICAL IMPRESSION(S) / ED DIAGNOSES  Final diagnoses:  Other secondary hypertension  Palpitations      NEW MEDICATIONS STARTED DURING THIS VISIT:  New Prescriptions   No medications on file     Note:  This document was prepared using Dragon voice recognition software and may include unintentional dictation errors.    Loney Hering, MD 08/10/15 [id number]  Loney Hering, MD 08/10/15 [id number]  Loney Hering, MD 08/10/15 0500

## 2015-08-23 ENCOUNTER — Other Ambulatory Visit: Payer: Self-pay | Admitting: *Deleted

## 2015-08-23 NOTE — Patient Outreach (Signed)
[place] [facility]) Care Management  08/23/2015  [place] January 07, 1935 [id number]   RN Health Coach received telephone call from  patient.  Hipaa compliance verified. Patient had a lot of questions regarding Signing her consent for [facility]. Patient voiced understanding and is faxing it in.  [place] Management [phone] .

## 2015-08-31 ENCOUNTER — Other Ambulatory Visit: Payer: Self-pay | Admitting: *Deleted

## 2015-08-31 NOTE — Patient Outreach (Signed)
[place] [facility]) Care Management  08/31/2015  [place] 1934/10/09 [id number]   RN Health Coach attempted #1   Follow up outreach call to patient.  Patient was unavailable. HIPPA compliance voicemail message was left with return callback number.   Plan: RN will try to contact again within 10 days  [place] Management [phone]

## 2015-09-08 ENCOUNTER — Encounter: Payer: Self-pay | Admitting: *Deleted

## 2015-09-08 ENCOUNTER — Other Ambulatory Visit: Payer: Self-pay | Admitting: *Deleted

## 2015-09-08 NOTE — Patient Outreach (Signed)
[place] [facility]) Care Management  [place]  09/08/2015   Sherry [name] Jun 07, 1934 [id number]  Subjective: Per patient her blood sugar today is 136 fasting.  She will have another A1C  done in august. Her last A1C done in march was 7.0. Patient is not having any dizziness or off balance now. She is breathing much better through her nose. Per patient once she was given the cefdinir and it helped a lot.  Patient is having pain in both knees.She is having them injected on Monday. 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 Patient has a rollator walker that she uses around the house but doesn't take it out. Per patient it is so much trouble to take out. Patient uses a cane. Per patient she has not had any additional falls since the bats in her house. Per patient she  Is still waiting on the bars to be installed in her house. Per patient she has called and spoke with the organizations and was told that they are waiting on donations to be able to fund the project. Per patient she needs the bars real bad. Patient is so afraid she is going to fall .   Per patient her appetite is fair. She is still supplementing her diet with the glucerna.     Objective:   Encounter Medications:  Outpatient Encounter Prescriptions as of 09/08/2015  Medication Sig  . losartan (COZAAR) 25 MG tablet Take 25 mg by mouth daily.  . metFORMIN (GLUCOPHAGE) 500 MG tablet Take 500 mg by mouth 2 (two) times daily with a meal.  . montelukast (SINGULAIR) 10 MG tablet Take 10 mg by mouth every morning.  . progesterone (PROMETRIUM) 200 MG capsule Take 200 mg by mouth daily.  . vitamin B-12 (CYANOCOBALAMIN) 1000 MCG tablet Take 1,000 mcg by mouth daily.   No facility-administered encounter medications on file as of 09/08/2015.    Functional Status:  In your present state of health, do you have any difficulty performing the following activities: 09/08/2015 05/24/2015  Hearing? N N   Vision? N N  Difficulty concentrating or making decisions? N N  Walking or climbing stairs? Y Y  Dressing or bathing? N N  Doing errands, shopping? [name]  Preparing Food and eating ? N -  Using the Toilet? N -  In the past six months, have you accidently leaked urine? N -  Do you have problems with loss of bowel control? N -  Managing your Medications? N -  Managing your Finances? N -  Housekeeping or managing your Housekeeping? Y -    Fall/Depression Screening: PHQ 2/9 Scores 09/08/2015 05/24/2015 04/27/2015 03/16/2015  PHQ - 2 Score 1 1 0 0   THN CM Care Plan Problem One        Most Recent Value   Care Plan Problem One  knowledge deficit in self management of diabetes   Role Documenting the Problem One  [place] for Problem One  Active   THN Long Term Goal (31-90 days)  Patient will continue to  not have any admissions for diabetes in 90 days   THN Long Term Goal Start Date  09/08/15 [continue]   Interventions for Problem One Long Term Goal  RN reminded the patient to keep her appointment with physician. RN reminded patient to take medications as prescribed. RN will  follow up  monthly.   THN CM Short Term Goal #4 (0-30 days)  Patient will be able to verbalize the action plan for high and low blood sugars within the next 30 days   THN CM Short Term Goal #4 Start Date  -- [continue/ patient has a better understanding of what to do, but needs reiterating]   Interventions for Short Term Goal #4  RN sent educational material on treating high and low blood sugar.  RN discussed the importance of also making the Dr aware when your blood sugar is real high. RN will follow up with discussion and  teachback.      THN CM Short Term Goal #5 (0-30 days)  Patient will report being contacted by agency for grab bars in bathroom within the next 30 days   THN CM Short Term Goal #5 Start Date  09/08/15   Interventions for Short Term Goal #5  RN contacted social worker and new agency [place] is to call back..      Assessment:  Patient has not had the bathroom bars installed due to organization lack of funding. Patient is to have bilateral knees injected on 09/11/2015  Plan:  RN called Education officer, museum and another organization called for Designer, television/film set gave patient phone number to call and discuss  [employer] well dine Patient is to have A1C drawn in August at next PCP visit RN sent patient educational material on wellness check and know your goals RN will follow up with 30 days   [place] Management [phone]

## 2015-09-09 ENCOUNTER — Other Ambulatory Visit: Payer: Self-pay | Admitting: *Deleted

## 2015-09-09 NOTE — Patient Outreach (Addendum)
[facility] [facility]) Care Management  09/09/2015  Sherry [name] [birth year] [id number]   Patient referred to this social worker to assist with community resources to get grab bars installed in her bathroom to assist in avoiding falls.  Referral made to [facility], spoke with Jenny Reichmann who will contact patient to get the grab bars installed.  This will be at no cost to patient, [employer] will cover the cost.    Plan:  Jenny Reichmann, from [employer] will contact patient to schedule date and time to take measurements.            Patient aware of referral and is expecting Cindy's call.            EMMI assigned on home safety.    [name] [facility] Care Management [phone]

## 2015-09-10 ENCOUNTER — Encounter: Payer: Self-pay | Admitting: *Deleted

## 2015-09-13 DIAGNOSIS — M170 Bilateral primary osteoarthritis of knee: Secondary | ICD-10-CM | POA: Diagnosis not present

## 2015-09-13 DIAGNOSIS — R6889 Other general symptoms and signs: Secondary | ICD-10-CM | POA: Diagnosis not present

## 2015-09-15 ENCOUNTER — Other Ambulatory Visit: Payer: Self-pay | Admitting: *Deleted

## 2015-09-15 NOTE — Patient Outreach (Signed)
[name]) Care Management  09/15/2015  Sherry [name] 10-13-1934 [id number]   Phone call to patient to follow up on the grab bar installation in her bathroom to avoid falls.  HIPPA compliant voicemail left for a return call.   [name] [facility] Care Management [phone]

## 2015-09-16 ENCOUNTER — Other Ambulatory Visit: Payer: Self-pay | Admitting: *Deleted

## 2015-09-16 NOTE — Patient Outreach (Signed)
[place] [facility]) Care Management  09/16/2015  ANALUCIA WILBURN 1934-11-05 [id number]   Phone call from patient who stated that she had made contact with Jenny Reichmann from [facility] who stated that her husband will be coming by on a weekend to take measurements for the grab bars.  She did not indicate what weekend.   Plan:  This Education officer, museum will follow up with patient regarding the installation of her grab bars within 1 month.    [name] [facility] Care Management [phone]

## 2015-09-20 ENCOUNTER — Encounter: Payer: Self-pay | Admitting: *Deleted

## 2015-09-20 ENCOUNTER — Other Ambulatory Visit: Payer: Self-pay | Admitting: *Deleted

## 2015-09-20 DIAGNOSIS — M170 Bilateral primary osteoarthritis of knee: Secondary | ICD-10-CM | POA: Diagnosis not present

## 2015-09-20 DIAGNOSIS — R6889 Other general symptoms and signs: Secondary | ICD-10-CM | POA: Diagnosis not present

## 2015-09-20 NOTE — Patient Outreach (Addendum)
[place] [facility]) Care Management  [place]  09/20/2015   Sherry [name] Oct 12, 1934 [id number]  Subjective: RN Health Coach telephone call to patient.  Hipaa compliance verified. Per patient she received the second injections in knee today. She has her legs elevated. Per patient the agency have not placed the grab bars in her bathrom yet. Per patient they was suppose to come over the weekend. Patient reported her blood sugar being 124. Patient had questions about her blood sugar being up to 200 after she ate her dinner. I explained to patient that the blood sugar will be elevated after the meal that is why they usually recommend that you take the blood sugar before or two hrs after meal. Patient has agreed to follow up outreach call.    Objective:   Encounter Medications:  Outpatient Encounter Prescriptions as of 09/20/2015  Medication Sig  . losartan (COZAAR) 25 MG tablet Take 25 mg by mouth daily.  . metFORMIN (GLUCOPHAGE) 500 MG tablet Take 500 mg by mouth 2 (two) times daily with a meal.  . montelukast (SINGULAIR) 10 MG tablet Take 10 mg by mouth every morning.  . progesterone (PROMETRIUM) 200 MG capsule Take 200 mg by mouth daily.  . vitamin B-12 (CYANOCOBALAMIN) 1000 MCG tablet Take 1,000 mcg by mouth daily.   No facility-administered encounter medications on file as of 09/20/2015.    Functional Status:  In your present state of health, do you have any difficulty performing the following activities: 09/20/2015 09/08/2015  Hearing? N N  Vision? N N  Difficulty concentrating or making decisions? - N  Walking or climbing stairs? Y Y  Dressing or bathing? N N  Doing errands, shopping? Y Y  Conservation officer, nature and eating ? - N  Using the Toilet? - N  In the past six months, have you accidently leaked urine? - N  Do you have problems with loss of bowel control? - N  Managing your Medications? - N  Managing your Finances? - N  Housekeeping or managing your  Housekeeping? - Y    Fall/Depression Screening: PHQ 2/9 Scores 09/20/2015 09/08/2015 05/24/2015 04/27/2015 03/16/2015  PHQ - 2 Score 1 1 1  0 0   THN CM Care Plan Problem One        Most Recent Value   Care Plan Problem One  knowledge deficit in self management of diabetes   Role Documenting the Problem One  [place] for Problem One  Active   THN Long Term Goal (31-90 days)  Patient will continue to  not have any admissions for diabetes in 90 days   THN Long Term Goal Start Date  09/08/15 [continue]   Interventions for Problem One Long Term Goal  RN reminded the patient to keep her appointment with physician. RN reminded patient to take medications as prescribed. RN will  follow up  monthly.   THN CM Short Term Goal #4 (0-30 days)  Patient will be able to verbalize the action plan for high and low blood sugars within the next 30 days   THN CM Short Term Goal #4 Start Date  09/20/15 [continue/ patient has a better understanding of what to do, but needs reiterating/ patient is reporting that blood sugar is around 200 after dinner.]   Interventions for Short Term Goal #4  RN sent educational material on treating high and low blood sugar.  RN discussed the importance of also making the Dr aware when your blood sugar is  real high. RN will follow up with discussion and  teachback.      THN CM Short Term Goal #5 (0-30 days)  Patient will report being contacted by agency for grab bars in bathroom within the next 30 days   THN CM Short Term Goal #5 Start Date  09/08/15 [unmet/ patient has been contacted by agency but they have not installed bars yet.]   Interventions for Short Term Goal #5  RN contacted social worker and new agency [employer] is to call back..      Assessment:  Patient grab bars have not been installed Patient is taking blood sugars after dinner not before   Plan:  RN will follow up with social worker on bar placement RN will send patient  additional information on when to check blood sugar and the readings RN will follow up within the month of August  Amenah Tucci [place] Management [phone]

## 2015-09-27 DIAGNOSIS — M170 Bilateral primary osteoarthritis of knee: Secondary | ICD-10-CM | POA: Diagnosis not present

## 2015-09-27 DIAGNOSIS — R6889 Other general symptoms and signs: Secondary | ICD-10-CM | POA: Diagnosis not present

## 2015-09-28 ENCOUNTER — Other Ambulatory Visit: Payer: Self-pay | Admitting: *Deleted

## 2015-09-28 NOTE — Patient Outreach (Signed)
[place] [facility]) Care Management  09/28/2015  [place] Nov 15, 1934 [id number]  RN Health Coach  received telephone call from  patient.  Hipaa compliance verified. Per patient she was very excited about the bars being placed in her bathroom. Patient was wanting to send the church a letter. Patient also stated her blood sugar was 400 after she had eaten. RN discussed with patient how soon had she taken her blood sugar after the meal. Patient had taken it within 30 minutes. RN talked with patient about the information she had sent to her stating to take it two hours after the meal. Patient stated she had received the information but had not read it yet. [place] Care Management [phone]

## 2015-10-07 ENCOUNTER — Ambulatory Visit: Payer: Self-pay | Admitting: *Deleted

## 2015-10-08 ENCOUNTER — Other Ambulatory Visit: Payer: Self-pay | Admitting: *Deleted

## 2015-10-08 ENCOUNTER — Encounter: Payer: Self-pay | Admitting: *Deleted

## 2015-10-08 NOTE — Patient Outreach (Signed)
[facility]) Care Management  10/08/2015  Sherry [name] 1934-07-15 [id number]   Phone call to patient to follow up on grab bars being installed in her bathroom.  Per patient, the grab bars were installed a couple of weeks ago.  Patient very appreciative of the referral, stating that [place] did an excellent job.  Patient verbalized no further social work needs at this time.  Case to be closed to social work.    [name] [facility] Care Management [phone]

## 2015-10-21 ENCOUNTER — Other Ambulatory Visit: Payer: Self-pay | Admitting: *Deleted

## 2015-10-21 DIAGNOSIS — E042 Nontoxic multinodular goiter: Secondary | ICD-10-CM | POA: Diagnosis not present

## 2015-10-21 DIAGNOSIS — R6889 Other general symptoms and signs: Secondary | ICD-10-CM | POA: Diagnosis not present

## 2015-10-21 DIAGNOSIS — E7800 Pure hypercholesterolemia, unspecified: Secondary | ICD-10-CM | POA: Diagnosis not present

## 2015-10-21 DIAGNOSIS — N183 Chronic kidney disease, stage 3 (moderate): Secondary | ICD-10-CM | POA: Diagnosis not present

## 2015-10-21 DIAGNOSIS — I10 Essential (primary) hypertension: Secondary | ICD-10-CM | POA: Diagnosis not present

## 2015-10-21 DIAGNOSIS — E1122 Type 2 diabetes mellitus with diabetic chronic kidney disease: Secondary | ICD-10-CM | POA: Diagnosis not present

## 2015-10-21 NOTE — Patient Outreach (Signed)
[place] [facility]) Care Management  10/21/2015  Sherry [name] 24-Mar-1934 [id number]    RN Health Coach  attempted  #1 Follow up outreach call to patient.  Patient was unavailable. HIPPA compliance voicemail message was left with return callback number.  Plan: RN will call patient again within 14 days.     [place] Care Management [phone]

## 2015-10-25 ENCOUNTER — Encounter: Payer: Self-pay | Admitting: *Deleted

## 2015-10-25 ENCOUNTER — Other Ambulatory Visit: Payer: Self-pay | Admitting: *Deleted

## 2015-10-25 NOTE — Patient Outreach (Addendum)
[facility]) Care Management  10/25/2015   AVE SCHARNHORST 18-Oct-1934 [id number]  Subjective: RN Health Coach returned  telephone call to patient.  Hipaa compliance verified. Per patient stated her blood sugar this morning was 124. Her weight is 182 pounds. Patient stated that she  went to see the Dr on 10/21/2015 and had labs drawn. Per patient the Dr office called her the labs and said they looked good. Patient does not know what they were. Patient asked the [place] call and find out what they are, What  They mean and call her back. Patient A1C went from 6.8 on 09/16 to 7.8 on 04/04. Per patient another was drawn on 08/3.  Patient is stating that she has some swelling in her leg and the Dr told her to take lasix every other day. Patient doesn't know for how long. Patient is not on a regular dose of lasix. Patient has not been doing any exercise due to the knee pain and now the swelling in the ankles. There is no tenderness in the leg with the swelling.  Patient states when the swelling gets bad in the leg then the heel has numbness. Patient has not had any falls since last nov. Patient is using a cane when ambulating.   Dr office faxed lab results. RN Health Coach explained A1C, Hepatic Panel and Lipid panel to patient. Patient voices understanding.  Objective:   Current Medications:  Current Outpatient Prescriptions  Medication Sig Dispense Refill  . losartan (COZAAR) 25 MG tablet Take 25 mg by mouth daily.    . metFORMIN (GLUCOPHAGE) 500 MG tablet Take 500 mg by mouth 2 (two) times daily with a meal.    . montelukast (SINGULAIR) 10 MG tablet Take 10 mg by mouth every morning.    . progesterone (PROMETRIUM) 200 MG capsule Take 200 mg by mouth daily.    . vitamin B-12 (CYANOCOBALAMIN) 1000 MCG tablet Take 1,000 mcg by mouth daily.     No current facility-administered medications for this visit.     Functional Status:  In your present state of health, do you have  any difficulty performing the following activities: 10/25/2015 09/20/2015  Hearing? N N  Vision? N N  Difficulty concentrating or making decisions? N -  Walking or climbing stairs? Y Y  Dressing or bathing? N N  Doing errands, shopping? [name]  Preparing Food and eating ? N -  Using the Toilet? N -  In the past six months, have you accidently leaked urine? N -  Do you have problems with loss of bowel control? N -  Managing your Medications? N -  Managing your Finances? N -  Housekeeping or managing your Housekeeping? Y -  Some recent data might be hidden    Fall/Depression Screening: PHQ 2/9 Scores 10/25/2015 09/20/2015 09/08/2015 05/24/2015 04/27/2015 03/16/2015  PHQ - 2 Score 0 '1 1 1 ' 0 0   THN CM Care Plan Problem One   Flowsheet Row Most Recent Value  Care Plan Problem One  knowledge deficit in self management of diabetes  Role Documenting the Problem One  [place] for Problem One  Active  THN Long Term Goal (31-90 days)  Patient will continue to  not have any admissions for diabetes in 90 days  THN Long Term Goal Start Date  10/25/15  Interventions for Problem One Long Term Goal  RN reminded the patient to keep her appointment with physician. RN reminded patient to take  medications as prescribed. RN will  follow up  monthly.  THN CM Short Term Goal #1 (0-30 days)  Patient will be able to report a decrease in her A1c within the next 30 days  THN CM Short Term Goal #1 Start Date  10/25/15  Interventions for Short Term Goal #1  RN reiterated what the A1c meant.RN discussed with patient ways to help the A1C come down. RN discussed controlling her blood sugar.   THN CM Short Term Goal #2 (0-30 days)  Patient will be able to verbalize How  Diabetes can affect the nerves within the next 30 days   THN CM Short Term Goal #2 Start Date  10/25/15  Interventions for Short Term Goal #2  RN sent educational material on Diabetic neuropathy, Peripheral edema and Peripheral vascular disease. RN  will follow up with discussion and teach back  THN CM Short Term Goal #4 (0-30 days)  Patient will be able to verbalize the action plan for high and low blood sugars within the next 30 days  THN CM Short Term Goal #4 Start Date  09/20/15 [continue/ patient has a better understanding of what to do, but needs reiterating/ patient is reporting that blood sugar is around 200 after dinner.]  THN CM Short Term Goal #4 Met Date  10/25/15  Interventions for Short Term Goal #4  RN sent educational material on treating high and low blood sugar.  RN discussed the importance of also making the Dr aware when your blood sugar is real high. RN will follow up with discussion and  teachback.     THN CM Short Term Goal #5 (0-30 days)  Patient will report being contacted by agency for grab bars in bathroom within the next 30 days  THN CM Short Term Goal #5 Start Date  09/08/15 [met bars installed]  THN CM Short Term Goal #5 Met Date  10/25/15  Interventions for Short Term Goal #5  RN contacted social worker and new agency [employer] is to call back..        Assessment:  Patient is having swelling in leg Patient is having some short term memory loss Patient will continue to benefit from [place] telephonic outreach for education and support for diabetes self management.  Plan:  RN sent patient EMMI material on neuropathy RN discussed with patient about support hose that patient can order from mail order catalog RN sent educational material on peripheral vascular disease RN sent patient educational picture chart on peripheral edema RN called Dr Ouida Sills office for call back regarding labs. RN will follow up outreach in the month of September  [facility] [place] Management [phone]

## 2015-10-27 NOTE — Telephone Encounter (Signed)
This encounter was created in error - please disregard.

## 2015-11-03 ENCOUNTER — Ambulatory Visit: Payer: Self-pay | Admitting: *Deleted

## 2015-11-29 ENCOUNTER — Other Ambulatory Visit: Payer: Self-pay | Admitting: *Deleted

## 2015-11-29 ENCOUNTER — Encounter: Payer: Self-pay | Admitting: *Deleted

## 2015-11-29 DIAGNOSIS — E1110 Type 2 diabetes mellitus with ketoacidosis without coma: Secondary | ICD-10-CM

## 2015-11-29 NOTE — Patient Outreach (Signed)
[facility]) Care Management  11/29/2015  KIARAH WIERINGA [birth year] [id number]  Return phone call from patient who states that she would like community resource information for transportation. Per patient, she would like to get to the [facility] and does not have the transportation to get there.  Per patient, she has family that drive,  however they are routinely busy and cannot asist with this.  Per patient, she does not think [facility] will allow her to ride anymore. This Education officer, museum given permission to contact [employer].  Spoke with Sharyn Lull, who states that patient does not have any suspensions on her access and that she has not ridden with them since November 2015. The rides are $3.00 per way.  This Education officer, museum provided the contact information for the [employer]  [phone] as well as Nichola Sizer [phone]. Patient verbalized no further no social work needs, case to be closed to social work at this time. Patient discussed needing grab bars in her downstairs bathroom.  Patient provided patient with the phone number of  [employer] that installed the grab bars in her upstairs bathroom previously and encouraged her to call to request bars in her downstairs bathroom.  Patient states that she would think about calling them. Patient declined requesting assistance from family members.  Patient verbalized no further social work needs at this time, patient's case to be closed to social work at this time.   [name] [facility] Care Management [phone]

## 2015-11-29 NOTE — Patient Outreach (Addendum)
[facility]) Care Management  11/29/2015  Sherry [name] [birth year] [id number]   Referral received from health coach, [name] to provide patient with transportation resources.  HIPPA compliant voicemail message left for a return call.  This Education officer, museum will await return call, however if no return call this social worker will attempt call again within 2 weeks.   [name] [facility] Care Management [phone]

## 2015-11-29 NOTE — Patient Outreach (Addendum)
[name]) Care Management  11/29/2015   Sherry [name] [birth year] [id number]  Subjective: RN Health Coach telephone call to patient.  Hipaa compliance verified. Per patient her fasting blood sugar this am was 113. Patient is stated that she eats 2 meals a day and sometimes the last meal is at 11 pm. Per patient she is not eating snacks in between. Patient stated that she does not have an appetite. Patient stated that she is not having much pain, but when she does it is in her knee. Patient stated that she is having a lot of swelling in her lower extremities . She is elevating them some and wears her support hose some. She has not called her physician back to make him aware that the fluid medication is for every other day On the days she is taking medication the swelling goes down. On the days she is not taking the medication the swelling comes back. Per patient she is not having any coughing, wheezing. No shortness of breath or chest pain. Patient stated that she is doing the chair exercises that [place] had sent her. She is doing them twice a week. Patient would like to go to an exercise program at the [facility] but transportation is an issue. Patient has agreed top follow up outreach calls.    Objective:   Current Medications:  Current Outpatient Prescriptions  Medication Sig Dispense Refill  . furosemide (LASIX) 40 MG tablet Take 40 mg by mouth every other day.    . losartan (COZAAR) 25 MG tablet Take 25 mg by mouth daily.    . metFORMIN (GLUCOPHAGE) 500 MG tablet Take 500 mg by mouth 2 (two) times daily with a meal.    . montelukast (SINGULAIR) 10 MG tablet Take 10 mg by mouth every morning.    . progesterone (PROMETRIUM) 200 MG capsule Take 200 mg by mouth daily.    . vitamin B-12 (CYANOCOBALAMIN) 1000 MCG tablet Take 1,000 mcg by mouth daily.     No current facility-administered medications for this visit.     Functional Status:  In your present state of  health, do you have any difficulty performing the following activities: 11/29/2015 10/25/2015  Hearing? N N  Vision? N N  Difficulty concentrating or making decisions? N N  Walking or climbing stairs? Y Y  Dressing or bathing? N N  Doing errands, shopping? [name]  Preparing Food and eating ? N N  Using the Toilet? N N  In the past six months, have you accidently leaked urine? N N  Do you have problems with loss of bowel control? N N  Managing your Medications? N N  Managing your Finances? N N  Housekeeping or managing your Housekeeping? [name]  Some recent data might be hidden    Fall/Depression Screening: PHQ 2/9 Scores 11/29/2015 10/25/2015 09/20/2015 09/08/2015 05/24/2015 04/27/2015 03/16/2015  PHQ - 2 Score 0 0 1 1 1  0 0   THN CM Care Plan Problem One   Flowsheet Row Most Recent Value  Care Plan Problem One  knowledge deficit in self management of diabetes  Role Documenting the Problem One  [place] for Problem One  Active  THN Long Term Goal (31-90 days)  Patient will continue to  not have any admissions for diabetes in 90 days  THN Long Term Goal Start Date  11/29/15  Interventions for Problem One Long Term Goal  RN reminded the patient to keep her appointment with  physician. RN reminded patient to take medications as prescribed. RN will  follow up  monthly.  THN CM Short Term Goal #1 Start Date  11/29/15  Interventions for Short Term Goal #1  RN reiterated what the A1c meant.RN discussed with patient ways to help the A1C come down. RN discussed controlling her blood sugar.   THN CM Short Term Goal #2 (0-30 days)  Patient will be able to verbalize How  Diabetes can affect the nerves within the next 30 days   THN CM Short Term Goal #2 Start Date  11/29/15  Interventions for Short Term Goal #2  RN sent educational material on Diabetic neuropathy, Peripheral edema and Peripheral vascular disease. RN will follow up with discussion and teach back       Assessment:  Patient is  having peripheral edema Patient has poor eating habits Patient will continue to benefit from [place] telephonic outreach for education and support for diabetes self management.  Received fax from physician office patient A1C is 6.9 on 08/17  Plan:  RN discussed patient eating habit and her metabolism  RN called physician office for latest A1C since last noted on 04/17 was 7.4 RN discussed peripheral edema/Patient to notify physician regarding medication effects of decreasing edema when taken and swelling coming back when not taken RN discussed with patient about sit and be fit classes on [employer] RN will refer to social worker RN will follow up with  patient in the month of October  Kshawn Canal [place] Management [phone]

## 2016-01-05 ENCOUNTER — Other Ambulatory Visit: Payer: Self-pay | Admitting: *Deleted

## 2016-01-05 NOTE — Patient Outreach (Signed)
[facility]) Care Management  01/05/2016  [place] [birth year] [id number]   RN Health Coach attempted #1  Follow up outreach call to patient.  Patient was unavailable. HIPPA compliance voicemail message was left with return callback number.   Plan: RN will call patient again within 14 days.    [place] Care Management [phone]

## 2016-01-18 ENCOUNTER — Other Ambulatory Visit: Payer: Self-pay | Admitting: *Deleted

## 2016-02-22 DIAGNOSIS — Z23 Encounter for immunization: Secondary | ICD-10-CM | POA: Diagnosis not present

## 2016-02-29 DIAGNOSIS — E7800 Pure hypercholesterolemia, unspecified: Secondary | ICD-10-CM | POA: Diagnosis not present

## 2016-02-29 DIAGNOSIS — Z0000 Encounter for general adult medical examination without abnormal findings: Secondary | ICD-10-CM | POA: Insufficient documentation

## 2016-02-29 DIAGNOSIS — N183 Chronic kidney disease, stage 3 (moderate): Secondary | ICD-10-CM | POA: Diagnosis not present

## 2016-02-29 DIAGNOSIS — E1122 Type 2 diabetes mellitus with diabetic chronic kidney disease: Secondary | ICD-10-CM | POA: Diagnosis not present

## 2016-02-29 DIAGNOSIS — E042 Nontoxic multinodular goiter: Secondary | ICD-10-CM | POA: Diagnosis not present

## 2016-02-29 DIAGNOSIS — I10 Essential (primary) hypertension: Secondary | ICD-10-CM | POA: Diagnosis not present

## 2016-03-10 ENCOUNTER — Other Ambulatory Visit: Payer: Self-pay | Admitting: *Deleted

## 2016-03-10 NOTE — Patient Outreach (Signed)
[facility] [facility]) Care Management  10/31/2/17  Sherry [name] 1935-02-10 [id number]   RN Health Coach  attempted  Follow up outreach call to patient.  Patient was unavailable. HIPPA compliance voicemail message was left with return callback number.   Plan: RN will call patient again within 14 days.    [place] Care Management [phone]

## 2016-03-14 ENCOUNTER — Encounter: Payer: Self-pay | Admitting: *Deleted

## 2016-03-14 NOTE — Patient Outreach (Signed)
[facility]) Care Management  03/10/2016  Sherry [name] 09/13/1934 [id number]  RN Health Coach attempted telephone call to patient.  RN noted per recorded message that the number had been blocked from the calling.  Plan: RN will send an unsuccessful outreach letter to patient to make patient aware that the number has been blocked. RN will make patient aware to call if done by mistake.  [place] Care Management [phone]

## 2016-03-23 ENCOUNTER — Other Ambulatory Visit: Payer: Self-pay | Admitting: *Deleted

## 2016-03-24 ENCOUNTER — Encounter: Payer: Self-pay | Admitting: *Deleted

## 2016-03-24 ENCOUNTER — Other Ambulatory Visit: Payer: Self-pay | Admitting: Internal Medicine

## 2016-03-24 DIAGNOSIS — Z1231 Encounter for screening mammogram for malignant neoplasm of breast: Secondary | ICD-10-CM

## 2016-03-24 NOTE — Patient Outreach (Signed)
[facility]) Care Management  03/24/2016   Sherry [name] Jan 07, 1935 [id number]  Subjective: RN Health Coach received telephone call from patient.  Hipaa compliance verified. Per patient she did not know that she had blocked the [place] number. Patient stated she appreciated all that we had done and she was going to check into why that happened. Per patient please call cell phone number if can't reach by home phone. Patient stated she had called the company that [facility] had to install her bathroom bars upstairs. The company came back and put additional bars downstairs for her. Patient A1C on 12/12 was 6.7. Patient is trying to do some chair exercises. Patient is trying eat more healthy. Patient did read her TSH labs and it is elevated. Patient is not on any thyroid medications at this time. RN told patient to contact them to bring it to their attention. Patient has not had any hypo or hyperglycemic reaction since last follow up outreach . Patient has agreed to follow up outreach calls.   Objective:   Current Medications:  Current Outpatient Prescriptions  Medication Sig Dispense Refill  . furosemide (LASIX) 40 MG tablet Take 40 mg by mouth every other day.    . losartan (COZAAR) 25 MG tablet Take 25 mg by mouth daily.    . metFORMIN (GLUCOPHAGE) 500 MG tablet Take 500 mg by mouth 2 (two) times daily with a meal.    . montelukast (SINGULAIR) 10 MG tablet Take 10 mg by mouth every morning.    . progesterone (PROMETRIUM) 200 MG capsule Take 200 mg by mouth daily.    . vitamin B-12 (CYANOCOBALAMIN) 1000 MCG tablet Take 1,000 mcg by mouth daily.     No current facility-administered medications for this visit.     Functional Status:  In your present state of health, do you have any difficulty performing the following activities: 03/24/2016 11/29/2015  Hearing? N -  Vision? N N  Difficulty concentrating or making decisions? N N  Walking or climbing stairs? Y Y  Dressing  or bathing? N N  Doing errands, shopping? [name]  Preparing Food and eating ? N N  Using the Toilet? N N  In the past six months, have you accidently leaked urine? N N  Do you have problems with loss of bowel control? N N  Managing your Medications? N N  Managing your Finances? N N  Housekeeping or managing your Housekeeping? N Y  Some recent data might be hidden    Fall/Depression Screening: PHQ 2/9 Scores 03/24/2016 11/29/2015 10/25/2015 09/20/2015 09/08/2015 05/24/2015 04/27/2015  PHQ - 2 Score 0 0 0 '1 1 1 ' 0   THN CM Care Plan Problem One   Flowsheet Row Most Recent Value  Care Plan Problem One  knowledge deficit in self management of diabetes  Care Plan for Problem One  Active  THN Long Term Goal (31-90 days)  Patient will continue to not have any admissions for diabetes in 90 days  THN Long Term Goal Start Date  03/24/16  Interventions for Problem One Long Term Goal  RN reminded the patient to keep her appointment with physician. RN reminded patient to take medications as prescribed. RN will  follow up  monthly.  THN CM Short Term Goal #1 (0-30 days)  Patient will be able to report a decrease in her A1c within the next 30 days  THN CM Short Term Goal #1 Met Date  03/24/16  Interventions for Short Term Goal #1  RN reiterated what the A1c meant.RN discussed with patient ways to help the A1C come down. RN discussed controlling her blood sugar.   THN CM Short Term Goal #2 (0-30 days)  Patient will be able to verbalize How  Diabetes can affect the nerves within the next 30 days   THN CM Short Term Goal #2 Met Date  03/24/16  THN CM Short Term Goal #3 (0-30 days)  Patient will report making appointments and Maintaining Health checks within the next 30 days  THN CM Short Term Goal #3 Start Date  03/24/16  Interventions for Short Tern Goal #3  RN discussed maintaining overall health checks such as eye exams, podiatry,detal,and hearing exams. RN will follow up with further discussion        Assessment:  A1C 6.7 on 02/29/2016 No falls since last November 2016 Per Patient is eating healthier and watching diet Patient has upstairs and downstairs safety bathroom bars installed Patient will continue to  benefit from [employer] telephonic outreach for education and support for diabetes self management.  Plan:  RN will send patient a 2018 calendar book RN will follow up on health maintenance appointments RN will follow up outreach within the month of February  Stepfanie Yott [place] Care Management [phone]

## 2016-03-30 DIAGNOSIS — L659 Nonscarring hair loss, unspecified: Secondary | ICD-10-CM | POA: Diagnosis not present

## 2016-03-30 DIAGNOSIS — E039 Hypothyroidism, unspecified: Secondary | ICD-10-CM | POA: Diagnosis not present

## 2016-04-20 ENCOUNTER — Other Ambulatory Visit: Payer: Self-pay | Admitting: *Deleted

## 2016-04-20 NOTE — Patient Outreach (Signed)
[facility]) Care Management  04/20/2016  [place] 05-26-1934 [id number]  RN Health Coach received telephone call from patient. Per patient she has lost all of her hair. Patient had lost a little with hypothyroidism in the past.  She has been off medication for a few years. Per patient she has never lost all of it like this time. RN Health coach explained to patient that when the levels become within the therapeutic range then the hair will usually come back. Hair loss sometimes happen with this condition.  Patient was worried that this was permament. Per patient  she has started on the thyroid medication and they will draw another level. Patient wanted a call back after the next level is drawn.   Plan: RN rescheduled call back after levels have been drawn and Dr visit.  [place] Care Management [phone]

## 2016-04-25 ENCOUNTER — Ambulatory Visit
Admission: RE | Admit: 2016-04-25 | Discharge: 2016-04-25 | Disposition: A | Discharge status: Home / Self Care | Payer: Commercial Managed Care - HMO | Source: Ambulatory Visit | Attending: Internal Medicine | Admitting: Internal Medicine

## 2016-04-25 DIAGNOSIS — R6889 Other general symptoms and signs: Secondary | ICD-10-CM | POA: Diagnosis not present

## 2016-04-25 DIAGNOSIS — Z1231 Encounter for screening mammogram for malignant neoplasm of breast: Secondary | ICD-10-CM | POA: Insufficient documentation

## 2016-05-02 ENCOUNTER — Ambulatory Visit: Payer: Commercial Managed Care - HMO | Admitting: *Deleted

## 2016-05-05 ENCOUNTER — Ambulatory Visit: Payer: Self-pay | Admitting: *Deleted

## 2016-05-11 DIAGNOSIS — E039 Hypothyroidism, unspecified: Secondary | ICD-10-CM | POA: Diagnosis not present

## 2016-05-17 ENCOUNTER — Encounter: Payer: Self-pay | Admitting: *Deleted

## 2016-05-17 ENCOUNTER — Other Ambulatory Visit: Payer: Self-pay | Admitting: *Deleted

## 2016-05-17 NOTE — Patient Outreach (Signed)
[place] [facility]) Care Management  05/17/2016   [place] Nov 09, 1934 [id number]  RN Health Coach  Received telephone call from  patient.  Hipaa compliance verified. Patient was crying and stated she had lost all her hair. Patient blood sugar is 122 fasting this am. Patient has not had any hypo or hyperglycemia reactions since last outreach call. Patient stated she had not had any pain in knees or behind eyes since she has been taking her vitamins. RN discussed medication adherence. Patient agreed to follow up outreach calls.   Current Medications:  Current Outpatient Prescriptions  Medication Sig Dispense Refill  . levothyroxine (SYNTHROID, LEVOTHROID) 25 MCG tablet Take 25 mcg by mouth daily before breakfast.    . furosemide (LASIX) 40 MG tablet Take 40 mg by mouth every other day.    . losartan (COZAAR) 25 MG tablet Take 25 mg by mouth daily.    . metFORMIN (GLUCOPHAGE) 500 MG tablet Take 500 mg by mouth 2 (two) times daily with a meal.    . montelukast (SINGULAIR) 10 MG tablet Take 10 mg by mouth every morning.    . progesterone (PROMETRIUM) 200 MG capsule Take 200 mg by mouth daily.    . vitamin B-12 (CYANOCOBALAMIN) 1000 MCG tablet Take 1,000 mcg by mouth daily.     No current facility-administered medications for this visit.     Functional Status:  In your present state of health, do you have any difficulty performing the following activities: 05/17/2016 03/24/2016  Hearing? N N  Vision? N N  Difficulty concentrating or making decisions? N N  Walking or climbing stairs? Y Y  Dressing or bathing? N N  Doing errands, shopping? [name]  Preparing Food and eating ? N N  Using the Toilet? N N  In the past six months, have you accidently leaked urine? N N  Do you have problems with loss of bowel control? N N  Managing your Medications? N N  Managing your Finances? N N  Housekeeping or managing your Housekeeping? N N  Some recent data might be hidden     Fall/Depression Screening: PHQ 2/9 Scores 05/17/2016 03/24/2016 11/29/2015 10/25/2015 09/20/2015 09/08/2015 05/24/2015  PHQ - 2 Score 0 0 0 0 1 1 1    THN CM Care Plan Problem One   Flowsheet Row Most Recent Value  Care Plan Problem One  knowledge deficit in self management of diabetes  Role Documenting the Problem One  [place] for Problem One  Active  THN Long Term Goal (31-90 days)  Patient will continue to not have any admissions for diabetes in 90 days  THN Long Term Goal Start Date  05/17/16  Interventions for Problem One Long Term Goal  RN reminded the patient to keep her appointment with physician. RN reminded patient to take medications as prescribed. RN will  follow up  monthly.  THN CM Short Term Goal #3 (0-30 days)  Patient will report making appointments and Maintaining Health checks within the next 30 days  THN CM Short Term Goal #3 Start Date  05/17/16  Interventions for Short Tern Goal #3  RN discussed maintaining overall health checks such as eye exams, podiatry,detal,and hearing exams. RN will follow up with further discussion      Assessment:  Patient has loss of hair Patient requested information on hypothyroidism to be better informed Patient will continue to  benefit from [place] telephonic outreach for education and support for diabetes self management.  Plan:  RN sent educational material on hypothyroidism RN discussed medication adherence RN discussed making health maintenance appointments for eye exam and feet examination RN discussed that hair loss does happen sometimes with hypothyroidism and does usually come back over a period of time RN will follow up within the month of March  Daphane Odekirk [place] Management [phone]

## 2016-06-06 DIAGNOSIS — H524 Presbyopia: Secondary | ICD-10-CM | POA: Diagnosis not present

## 2016-06-06 DIAGNOSIS — H25813 Combined forms of age-related cataract, bilateral: Secondary | ICD-10-CM | POA: Diagnosis not present

## 2016-06-06 DIAGNOSIS — E113393 Type 2 diabetes mellitus with moderate nonproliferative diabetic retinopathy without macular edema, bilateral: Secondary | ICD-10-CM | POA: Diagnosis not present

## 2016-06-13 ENCOUNTER — Other Ambulatory Visit: Payer: Self-pay | Admitting: *Deleted

## 2016-06-13 NOTE — Patient Outreach (Signed)
[facility]) Care Management  06/13/2016  Sherry [name] 11/24/1934 [id number]   RN Health Coach received telephone voice mail from patient. RN Health Coach returned phone call. Per patient she is looking at having cataract surgery but has not had her levels drawn for her TSH. The patient last TSH ws 6.25 and she is to have another one day on the  [id number]. Patient is afraid to have the surgery with her level elevated. RN explained that can wait until the levels are drawn or ask her physician to draw them early. She will be able to see if the synthroid 23 mcg is effective.  Plan: Patient will let RN know when levels are drawn and her surgery plan.  [place] Care Management [phone]

## 2016-06-15 ENCOUNTER — Ambulatory Visit: Payer: Self-pay | Admitting: *Deleted

## 2016-06-21 DIAGNOSIS — L669 Cicatricial alopecia, unspecified: Secondary | ICD-10-CM | POA: Diagnosis not present

## 2016-06-21 DIAGNOSIS — L720 Epidermal cyst: Secondary | ICD-10-CM | POA: Diagnosis not present

## 2016-06-21 DIAGNOSIS — L659 Nonscarring hair loss, unspecified: Secondary | ICD-10-CM | POA: Diagnosis not present

## 2016-06-21 DIAGNOSIS — D485 Neoplasm of uncertain behavior of skin: Secondary | ICD-10-CM | POA: Diagnosis not present

## 2016-06-21 DIAGNOSIS — R683 Clubbing of fingers: Secondary | ICD-10-CM | POA: Diagnosis not present

## 2016-06-27 DIAGNOSIS — N183 Chronic kidney disease, stage 3 (moderate): Secondary | ICD-10-CM | POA: Diagnosis not present

## 2016-06-27 DIAGNOSIS — Z0000 Encounter for general adult medical examination without abnormal findings: Secondary | ICD-10-CM | POA: Diagnosis not present

## 2016-06-27 DIAGNOSIS — E1122 Type 2 diabetes mellitus with diabetic chronic kidney disease: Secondary | ICD-10-CM | POA: Diagnosis not present

## 2016-06-27 DIAGNOSIS — E7800 Pure hypercholesterolemia, unspecified: Secondary | ICD-10-CM | POA: Diagnosis not present

## 2016-06-27 DIAGNOSIS — E049 Nontoxic goiter, unspecified: Secondary | ICD-10-CM | POA: Diagnosis not present

## 2016-07-04 DIAGNOSIS — E1122 Type 2 diabetes mellitus with diabetic chronic kidney disease: Secondary | ICD-10-CM | POA: Diagnosis not present

## 2016-07-04 DIAGNOSIS — E7800 Pure hypercholesterolemia, unspecified: Secondary | ICD-10-CM | POA: Diagnosis not present

## 2016-07-04 DIAGNOSIS — R0602 Shortness of breath: Secondary | ICD-10-CM | POA: Diagnosis not present

## 2016-07-04 DIAGNOSIS — Z0000 Encounter for general adult medical examination without abnormal findings: Secondary | ICD-10-CM | POA: Diagnosis not present

## 2016-07-04 DIAGNOSIS — N183 Chronic kidney disease, stage 3 (moderate): Secondary | ICD-10-CM | POA: Diagnosis not present

## 2016-07-04 DIAGNOSIS — I10 Essential (primary) hypertension: Secondary | ICD-10-CM | POA: Diagnosis not present

## 2016-07-04 DIAGNOSIS — E042 Nontoxic multinodular goiter: Secondary | ICD-10-CM | POA: Diagnosis not present

## 2016-07-05 ENCOUNTER — Other Ambulatory Visit: Payer: Self-pay | Admitting: *Deleted

## 2016-07-05 NOTE — Patient Outreach (Signed)
.  [facility]) Care Management  07/05/2016  [place] Sep 07, 1934 [id number]    RN Health Coach telephone received telephone call from patient.  Hipaa compliance verified. Patient called to give her lab results. Patient TSH  blood levels are within range TSH 4.139. Patient postponed eye surgery until TSH results received. Patient has moderate hair loss. Patient went to dermatologist. Patient was informed that hair will come back within a few months. Patient A1C is  6.6. Patient has made another appointment for eye surgery. Patient fell in bathroom. No injuries was noted. Patient was able to get up.   [place] Care Management [phone]

## 2016-07-10 DIAGNOSIS — E782 Mixed hyperlipidemia: Secondary | ICD-10-CM | POA: Diagnosis not present

## 2016-07-10 DIAGNOSIS — I10 Essential (primary) hypertension: Secondary | ICD-10-CM | POA: Diagnosis not present

## 2016-07-10 DIAGNOSIS — E119 Type 2 diabetes mellitus without complications: Secondary | ICD-10-CM | POA: Diagnosis not present

## 2016-07-10 DIAGNOSIS — R6889 Other general symptoms and signs: Secondary | ICD-10-CM | POA: Diagnosis not present

## 2016-07-10 DIAGNOSIS — R0602 Shortness of breath: Secondary | ICD-10-CM | POA: Diagnosis not present

## 2016-07-10 DIAGNOSIS — N183 Chronic kidney disease, stage 3 (moderate): Secondary | ICD-10-CM | POA: Diagnosis not present

## 2016-07-10 DIAGNOSIS — R600 Localized edema: Secondary | ICD-10-CM | POA: Diagnosis not present

## 2016-07-10 DIAGNOSIS — R0609 Other forms of dyspnea: Secondary | ICD-10-CM | POA: Diagnosis not present

## 2016-07-10 DIAGNOSIS — I208 Other forms of angina pectoris: Secondary | ICD-10-CM | POA: Diagnosis not present

## 2016-07-10 DIAGNOSIS — K219 Gastro-esophageal reflux disease without esophagitis: Secondary | ICD-10-CM | POA: Diagnosis not present

## 2016-07-14 DIAGNOSIS — H18413 Arcus senilis, bilateral: Secondary | ICD-10-CM | POA: Diagnosis not present

## 2016-07-14 DIAGNOSIS — E119 Type 2 diabetes mellitus without complications: Secondary | ICD-10-CM | POA: Diagnosis not present

## 2016-07-14 DIAGNOSIS — H2513 Age-related nuclear cataract, bilateral: Secondary | ICD-10-CM | POA: Diagnosis not present

## 2016-07-14 DIAGNOSIS — H02839 Dermatochalasis of unspecified eye, unspecified eyelid: Secondary | ICD-10-CM | POA: Diagnosis not present

## 2016-07-24 DIAGNOSIS — I208 Other forms of angina pectoris: Secondary | ICD-10-CM | POA: Diagnosis not present

## 2016-07-24 DIAGNOSIS — R6889 Other general symptoms and signs: Secondary | ICD-10-CM | POA: Diagnosis not present

## 2016-07-24 DIAGNOSIS — R0602 Shortness of breath: Secondary | ICD-10-CM | POA: Diagnosis not present

## 2016-07-26 ENCOUNTER — Other Ambulatory Visit: Payer: Self-pay | Admitting: *Deleted

## 2016-07-26 NOTE — Patient Outreach (Signed)
[place] [facility]) Care Management  07/26/2016  JONIE BURDELL September 09, 1934 [id number]   RN Health Coach attempted  Follow up outreach call to patient.  Patient was unavailable. HIPPA compliance voicemail message was left with return callback number.  Plan: RN will call patient again within 14 days.   [place] Care Management [phone]

## 2016-07-31 DIAGNOSIS — R600 Localized edema: Secondary | ICD-10-CM | POA: Diagnosis not present

## 2016-07-31 DIAGNOSIS — I208 Other forms of angina pectoris: Secondary | ICD-10-CM | POA: Diagnosis not present

## 2016-07-31 DIAGNOSIS — N183 Chronic kidney disease, stage 3 (moderate): Secondary | ICD-10-CM | POA: Diagnosis not present

## 2016-07-31 DIAGNOSIS — E119 Type 2 diabetes mellitus without complications: Secondary | ICD-10-CM | POA: Diagnosis not present

## 2016-07-31 DIAGNOSIS — I10 Essential (primary) hypertension: Secondary | ICD-10-CM | POA: Diagnosis not present

## 2016-07-31 DIAGNOSIS — R0602 Shortness of breath: Secondary | ICD-10-CM | POA: Diagnosis not present

## 2016-07-31 DIAGNOSIS — K219 Gastro-esophageal reflux disease without esophagitis: Secondary | ICD-10-CM | POA: Diagnosis not present

## 2016-07-31 DIAGNOSIS — R0609 Other forms of dyspnea: Secondary | ICD-10-CM | POA: Diagnosis not present

## 2016-07-31 DIAGNOSIS — E782 Mixed hyperlipidemia: Secondary | ICD-10-CM | POA: Diagnosis not present

## 2016-08-04 ENCOUNTER — Other Ambulatory Visit: Payer: Self-pay | Admitting: *Deleted

## 2016-08-04 NOTE — Patient Outreach (Signed)
[facility]) Care Management  08/04/2016   [place] [birth year] [id number]   [place] returned  telephone call from patient.  Hipaa compliance verified. Per patient her fasting blood sugar is 118. Patient stated her balance does feel a little unsteady. She has not fallen.  Per patient she is having some shortness of breath at times on exertion. Patient went to the cardiologist and he suggested that the patient have a cardiac cath. Patient has some swelling in lower extremities. Patient wanted to wait till  when she comes back from the graduation next week. Patient has agreed to follow up outreach calls.    Current Medications:  Current Outpatient Prescriptions  Medication Sig Dispense Refill  . furosemide (LASIX) 40 MG tablet Take 40 mg by mouth every other day.    . levothyroxine (SYNTHROID, LEVOTHROID) 25 MCG tablet Take 25 mcg by mouth daily before breakfast.    . losartan (COZAAR) 25 MG tablet Take 25 mg by mouth daily.    . metFORMIN (GLUCOPHAGE) 500 MG tablet Take 500 mg by mouth 2 (two) times daily with a meal.    . progesterone (PROMETRIUM) 200 MG capsule Take 200 mg by mouth daily.    . vitamin B-12 (CYANOCOBALAMIN) 1000 MCG tablet Take 1,000 mcg by mouth daily.    . montelukast (SINGULAIR) 10 MG tablet Take 10 mg by mouth every morning.     No current facility-administered medications for this visit.     Functional Status:  In your present state of health, do you have any difficulty performing the following activities: 08/04/2016 05/17/2016  Hearing? N N  Vision? N N  Difficulty concentrating or making decisions? N N  Walking or climbing stairs? Y Y  Dressing or bathing? N N  Doing errands, shopping? [name]  Preparing Food and eating ? N N  Using the Toilet? N N  In the past six months, have you accidently leaked urine? N N  Do you have problems with loss of bowel control? N N  Managing your Medications? N N  Managing your Finances? N N   Housekeeping or managing your Housekeeping? N N  Some recent data might be hidden    Fall/Depression Screening: Fall Risk  08/04/2016 05/17/2016 03/24/2016  Falls in the past year? No No No  Number falls in past yr: 1 1 -  Injury with Fall? Yes Yes -  Risk Factor Category  High Fall Risk High Fall Risk High Fall Risk  Risk for fall due to : Impaired balance/gait;Impaired mobility Impaired balance/gait;Impaired mobility Impaired balance/gait;Impaired mobility  Follow up Falls evaluation completed;Education provided;Falls prevention discussed Education provided;Falls evaluation completed;Falls prevention discussed -   PHQ 2/9 Scores 08/04/2016 05/17/2016 03/24/2016 11/29/2015 10/25/2015 09/20/2015 09/08/2015  PHQ - 2 Score 0 0 0 0 0 1 1   THN CM Care Plan Problem One     Most Recent Value  Care Plan Problem One  knowledge deficit in self management of diabetes  Role Documenting the Problem One  [place] for Problem One  Active  THN Long Term Goal (31-90 days)  Patient will continue to not have any admissions for diabetes in 90 days  THN Long Term Goal Start Date  08/04/16  Interventions for Problem One Long Term Goal  RN reminded the patient to keep her appointment with physician. RN reminded patient to take medications as prescribed. RN will  follow up  monthly.  THN CM Short Term Goal #3 (0-30  days)  Patient will report making appointments and Maintaining Health checks within the next 30 days  THN CM Short Term Goal #3 Start Date  08/04/16  Interventions for Short Tern Goal #3  RN discussed maintaining overall health checks such as eye exams, podiatry,detal,and hearing exams. RN will follow up with further discussion      Assessment:  Patient is having some swelling in lower extremities Patient is short of breath on exertion Patient fasting blood sugar is 118  Plan:  Patient is pending cardiac catherization after returning from out of town RN discussed medication adherence RN  will follow up within the month of June  Chelcy Bolda [place] Management [phone]

## 2016-08-08 ENCOUNTER — Ambulatory Visit: Payer: Self-pay | Admitting: *Deleted

## 2016-08-22 ENCOUNTER — Ambulatory Visit: Payer: Self-pay | Admitting: *Deleted

## 2016-08-24 ENCOUNTER — Other Ambulatory Visit: Payer: Self-pay | Admitting: *Deleted

## 2016-08-25 NOTE — Patient Outreach (Signed)
[name]) Care Management  08/24/2016  [place] 29-Nov-1934 [id number]   RN Health Coach telephone call to patient.  Hipaa compliance verified. Per patient she has returned from her grand daughter graduation. Patient stated that she did pretty well but had some episodes of fatigue and dizzy. Per patient she has not fallen but if she hadn't had so much family around when she lost her balance she might have.  Per patient her fasting blood sugar is 122. Per patient her highest is 132. Patient has not had any episodes of hypo or hyperglycemia since last outreach call. Patient has agreed to follow up outreach calls    Current Medications:  Current Outpatient Prescriptions  Medication Sig Dispense Refill  . furosemide (LASIX) 40 MG tablet Take 40 mg by mouth every other day.    . levothyroxine (SYNTHROID, LEVOTHROID) 25 MCG tablet Take 25 mcg by mouth daily before breakfast.    . losartan (COZAAR) 25 MG tablet Take 25 mg by mouth daily.    . metFORMIN (GLUCOPHAGE) 500 MG tablet Take 500 mg by mouth 2 (two) times daily with a meal.    . montelukast (SINGULAIR) 10 MG tablet Take 10 mg by mouth every morning.    . progesterone (PROMETRIUM) 200 MG capsule Take 200 mg by mouth daily.    . vitamin B-12 (CYANOCOBALAMIN) 1000 MCG tablet Take 1,000 mcg by mouth daily.     No current facility-administered medications for this visit.     Functional Status:  In your present state of health, do you have any difficulty performing the following activities: 08/24/2016 08/04/2016  Hearing? N N  Vision? N N  Difficulty concentrating or making decisions? N N  Walking or climbing stairs? Y Y  Dressing or bathing? N N  Doing errands, shopping? [name]  Preparing Food and eating ? N N  Using the Toilet? N N  In the past six months, have you accidently leaked urine? N N  Do you have problems with loss of bowel control? N N  Managing your Medications? N N  Managing your Finances? N N   Housekeeping or managing your Housekeeping? N N  Some recent data might be hidden    Fall/Depression Screening: Fall Risk  08/24/2016 08/04/2016 05/17/2016  Falls in the past year? No No No  Number falls in past yr: - 1 1  Injury with Fall? - Yes Yes  Risk Factor Category  - High Fall Risk High Fall Risk  Risk for fall due to : Impaired balance/gait;Impaired mobility Impaired balance/gait;Impaired mobility Impaired balance/gait;Impaired mobility  Follow up - Falls evaluation completed;Education provided;Falls prevention discussed Education provided;Falls evaluation completed;Falls prevention discussed   PHQ 2/9 Scores 08/24/2016 08/04/2016 05/17/2016 03/24/2016 11/29/2015 10/25/2015 09/20/2015  PHQ - 2 Score 0 0 0 0 0 0 1   THN CM Care Plan Problem One     Most Recent Value  Care Plan Problem One  knowledge deficit in self management of diabetes  Role Documenting the Problem One  [place] for Problem One  Active  THN Long Term Goal   Patient will continue to not have any admissions for diabetes in 90 days  THN Long Term Goal Start Date  08/25/16  Interventions for Problem One Long Term Goal  RN reminded the patient to keep her appointment with physician. RN reminded patient to take medications as prescribed. RN will  follow up  monthly.  THN CM Short Term Goal #3  Patient  will report making appointments and Maintaining Health checks within the next 30 days  THN CM Short Term Goal #3 Start Date  08/25/16  Interventions for Short Tern Goal #3  RN discussed maintaining overall health checks such as eye exams, podiatry,detal,and hearing exams. RN will follow up with further discussion      Assessment:  Patient will follow up with health care maintenance Patient will continue benefit from [place] telephonic outreach for education and support for diabetes self management.  Plan:  Patient is following up with cardiologist for cardiac cath When cardiac cath cleared patient will  follow up with cataracts removal Patient follow up with appointment with podiatrist RN Health Coach will follow up outreach within the month of July  [name] BSN RN [place] Management [phone]

## 2016-09-25 ENCOUNTER — Other Ambulatory Visit: Payer: Self-pay | Admitting: *Deleted

## 2016-09-25 NOTE — Patient Outreach (Signed)
[place] [facility]) Care Management  09/25/2016  Sherry [name] 1935/01/16 [id number]   RN Health Coach attempted follow up outreach call to patient.  Patient was unavailable. HIPPA compliance voicemail message left with return callback number.  Plan: RN will call patient again within 14 days.  [place] Care Management [phone]

## 2016-09-28 ENCOUNTER — Ambulatory Visit: Payer: Self-pay | Admitting: Podiatry

## 2016-10-05 ENCOUNTER — Ambulatory Visit: Payer: Self-pay | Admitting: *Deleted

## 2016-10-06 DIAGNOSIS — M48062 Spinal stenosis, lumbar region with neurogenic claudication: Secondary | ICD-10-CM | POA: Diagnosis not present

## 2016-10-06 DIAGNOSIS — M5416 Radiculopathy, lumbar region: Secondary | ICD-10-CM | POA: Diagnosis not present

## 2016-10-06 DIAGNOSIS — M5136 Other intervertebral disc degeneration, lumbar region: Secondary | ICD-10-CM | POA: Diagnosis not present

## 2016-10-13 ENCOUNTER — Ambulatory Visit (INDEPENDENT_AMBULATORY_CARE_PROVIDER_SITE_OTHER): Payer: Commercial Managed Care - HMO

## 2016-10-13 ENCOUNTER — Ambulatory Visit (INDEPENDENT_AMBULATORY_CARE_PROVIDER_SITE_OTHER): Payer: Commercial Managed Care - HMO | Admitting: Podiatry

## 2016-10-13 VITALS — BP 146/78 | HR 83 | Temp 97.8°F | Resp 16

## 2016-10-13 DIAGNOSIS — M76822 Posterior tibial tendinitis, left leg: Secondary | ICD-10-CM | POA: Diagnosis not present

## 2016-10-13 DIAGNOSIS — E279 Disorder of adrenal gland, unspecified: Secondary | ICD-10-CM

## 2016-10-13 DIAGNOSIS — E278 Other specified disorders of adrenal gland: Secondary | ICD-10-CM | POA: Insufficient documentation

## 2016-10-13 DIAGNOSIS — M79671 Pain in right foot: Secondary | ICD-10-CM

## 2016-10-13 DIAGNOSIS — R6889 Other general symptoms and signs: Secondary | ICD-10-CM | POA: Diagnosis not present

## 2016-10-13 NOTE — Progress Notes (Signed)
Subjective:     Patient ID: Sherry [name], female   DOB: 1934/12/26, 82 y.o.   MRN: [id number]  HPI   Review of Systems  Musculoskeletal: Positive for back pain, gait problem and joint swelling.  All other systems reviewed and are negative.      Objective:   Physical Exam     Assessment:         Plan:

## 2016-10-15 MED ORDER — BETAMETHASONE SOD PHOS & ACET 6 (3-3) MG/ML IJ SUSP: 3.0000 mg | Freq: Once | INTRAMUSCULAR | Status: DC

## 2016-10-15 NOTE — Progress Notes (Signed)
Patient ID: Sherry [name], female   DOB: 06-03-1934, 82 y.o.   MRN: [id number]   HPI: 82 year old female presents the office today as a new patient for evaluation of left foot and ankle pain. Patient states that approximately 12 years ago she stepped off a curb and injured her left foot. She believes that possibly is correlated symptoms she is feeling. Patient has had some sudden onset pain for several months now. Patient states his left foot and ankle pain with some moderate swelling. Patient presents today for further treatment and evaluation.   Physical Exam: General: The patient is alert and oriented x3 in no acute distress.  Dermatology: Skin is warm, dry and supple bilateral lower extremities. Negative for open lesions or macerations.  Vascular: Palpable pedal pulses bilaterally. No edema or erythema noted. Capillary refill within normal limits.  Neurological: Epicritic and protective threshold grossly intact bilaterally.   Musculoskeletal Exam: Collapses medial longitudinal arch is noted consistent with posterior tibial tendon dysfunction. There appears to be some significant arthritic changes noted to the left foot and ankle with limited range of motion. Muscle strength 5/5 in all groups bilateral.   Radiographic Exam:  Normal osseous mineralization. Degenerative changes noted to the left foot and ankle joints with joint space narrowing. There is some threaded changes also noted diffusely throughout the foot and ankle left lower extremity. No obvious sign of fracture.  Assessment: 1. Posterior tibial tendon dysfunction left lower extremity 2. Posterior tibial tendinitis left  Plan of Care:  1. Patient was evaluated. X-rays reviewed today 2. Injection of 0.5 mL Celestone Soluspan injected into the patient's posterior tibial tendon sheath. Care was taken to avoid direct injection into the tendon itself 3. Today I recommended immobilization cam boot. However the patient states that  she will not wear it. 4. Today compression anklet was dispensed 5. Recommend the patient continue wearing below-knee compression socks 6. Today were going to arrange an appointment Liliane Channel, Pedorthist for possible AFO 7. Return to clinic when necessary.  Appointments will be with Pedorthist to ensure the patient is well fitted for an AFO. Patient states that she already has an OTC AFO from several years ago however she does not like it. We will see if we can provide her a better custom fitted AFO brace for support her foot and ankle and is comfortable for her  Edrick Kins, DPM [facility]  Dr. Edrick Kins, DPM    [address], [place] [address]                Office [phone]  Fax [phone]

## 2016-10-19 ENCOUNTER — Other Ambulatory Visit: Payer: Self-pay | Admitting: *Deleted

## 2016-10-19 DIAGNOSIS — E0800 Diabetes mellitus due to underlying condition with hyperosmolarity without nonketotic hyperglycemic-hyperosmolar coma (NKHHC): Secondary | ICD-10-CM

## 2016-10-24 NOTE — Patient Outreach (Signed)
[facility]) Care Management  [id number]  MELLISSA CONLEY [birth year] [id number]  RN Health Coach telephone call to patient.  Hipaa compliance verified. Per patient she has not had her eye surgery yet. Patient stated she wasn't aware that when she went for the evaluation of eye surgery before her trip that she was pre-op for surgery. Per patient the surgery will be rescheduled for September.  Per patient her fasting blood sugar was 120. Patient stated her blood sugars are running good. Per patient her balance is still a little off. She is unable to go shopping within someone with her. Per patient she has not had any additional falls. Patient would like to talk with pharmacy regarding her medications to make sure that it is not something with them. Patient has agreed to follow up outreach calls.    Current Medications:  Current Outpatient Prescriptions  Medication Sig Dispense Refill  . ACCU-CHEK SMARTVIEW test strip     . Ascorbic Acid (VITAMIN C) 1000 MG tablet Take by mouth.    . cyanocobalamin (V-R VITAMIN B-12) 500 MCG tablet Take by mouth.    . estradiol (ESTRACE) 0.5 MG tablet TAKE 1 TABLET EVERY DAY    . fluticasone (FLONASE) 50 MCG/ACT nasal spray Place into the nose.    . furosemide (LASIX) 40 MG tablet Take by mouth.    Marland Kitchen glucose blood test strip USE 1 TIME DAILY AS INSTRUCTED    . levothyroxine (SYNTHROID, LEVOTHROID) 25 MCG tablet TAKE 1 TABLET EVERY DAY ON AN EMPTY STOMACH WITH A GLASS OF WATER AT LEAST 30 TO 60 MINUTES BEFORE BREAKFAST    . losartan (COZAAR) 25 MG tablet TAKE 1 TABLET EVERY DAY    . metFORMIN (GLUCOPHAGE) 500 MG tablet TAKE 1 TABLET TWICE DAILY WITH MEALS    . montelukast (SINGULAIR) 10 MG tablet Take by mouth.    . progesterone (PROMETRIUM) 200 MG capsule Take 200 mg by mouth daily.    . progesterone (PROMETRIUM) 200 MG capsule TAKE 1 CAPSULE EVERY NIGHT    . ranitidine (ZANTAC) 300 MG tablet TAKE 1 TABLET TWICE DAILY    . vitamin B-12  (CYANOCOBALAMIN) 1000 MCG tablet Take 1,000 mcg by mouth daily.     Current Facility-Administered Medications  Medication Dose Route Frequency Provider Last Rate Last Dose  . betamethasone acetate-betamethasone sodium phosphate (CELESTONE) injection 3 mg  3 mg Intramuscular Once Edrick Kins, DPM        Functional Status:  In your present state of health, do you have any difficulty performing the following activities: 10/19/2016 08/24/2016  Hearing? N N  Vision? N N  Difficulty concentrating or making decisions? N N  Walking or climbing stairs? Y Y  Dressing or bathing? N N  Doing errands, shopping? [name]  Preparing Food and eating ? N N  Using the Toilet? N N  In the past six months, have you accidently leaked urine? N N  Do you have problems with loss of bowel control? N N  Managing your Medications? N N  Managing your Finances? N N  Housekeeping or managing your Housekeeping? N N  Some recent data might be hidden    Fall/Depression Screening: Fall Risk  10/19/2016 08/24/2016 08/04/2016  Falls in the past year? No No No  Comment - - -  Number falls in past yr: - - 1  Injury with Fall? - - Yes  Comment - - -  Risk Factor Category  High Fall Risk - High Fall  Risk  Risk for fall due to : Impaired balance/gait;History of fall(s);Impaired mobility Impaired balance/gait;Impaired mobility Impaired balance/gait;Impaired mobility  Follow up Falls evaluation completed;Education provided;Falls prevention discussed - Falls evaluation completed;Education provided;Falls prevention discussed   PHQ 2/9 Scores 10/19/2016 08/24/2016 08/04/2016 05/17/2016 03/24/2016 11/29/2015 10/25/2015  PHQ - 2 Score 0 0 0 0 0 0 0   THN CM Care Plan Problem One     Most Recent Value  Care Plan Problem One  knowledge deficit in self management of diabetes  Role Documenting the Problem One  [place] for Problem One  Active  THN Long Term Goal   Patient will continue to not have any admissions for diabetes in 90  days  THN Long Term Goal Start Date  10/19/16  Interventions for Problem One Long Term Goal  RN reminded the patient to keep her appointment with physician. RN reminded patient to take medications as prescribed. RN will  follow up  monthly.  THN CM Short Term Goal #3  Patient will report making appointments and Maintaining Health checks within the next 30 days  THN CM Short Term Goal #3 Start Date  10/19/16  Interventions for Short Tern Goal #3  RN discussed maintaining overall health checks such as eye exams, podiatry,detal,and hearing exams. RN will follow up with further discussion     Assessment:  Patient awaiting cataract surgery Patient has not went over cardiac cath results Patient fasting blood sugar is 120 Patient will continue to  benefit from [place] telephonic outreach for education and support for diabetes self management.  Plan:  Referred to pharmacy RN discussed health maintenance   RN will follow up outreach within the month of September

## 2016-10-30 ENCOUNTER — Telehealth: Payer: Self-pay | Admitting: Pharmacist

## 2016-10-30 NOTE — Patient Outreach (Signed)
[facility]) Care Management  [place]   10/30/2016  Sherry [name] 02-Jun-1934 [id number]  82 year old female referred to [place] by [place] as patient has questions regarding her medications and possible side effects with dizziness and balance. PMHx includes, but not limited to, type II diabetes mellitus, angina at rest, HTN, HLD, osteoarthritis, multi-nodular goiter, GERD, chronic kidney disease stage III, and acute on chronic back pain.     Subjective: Successful telephone encounter with patient.  HIPAA identifiers verified. Patient agreeable to medication reconciliation.  Patient reports she has had on-going dizziness and balance issues after a gallbladder surgery 3 years ago.  She does not think that she started any new medications after the surgery.  She is wondering if any of her medications can cause dizziness.    Objective:   Encounter Medications: Outpatient Encounter Prescriptions as of 10/30/2016  Medication Sig Note  . ACCU-CHEK SMARTVIEW test strip    . Ascorbic Acid (VITAMIN C) 1000 MG tablet Take by mouth.   . cyanocobalamin (V-R VITAMIN B-12) 500 MCG tablet Take by mouth.   . estradiol (ESTRACE) 0.5 MG tablet TAKE 1 TABLET EVERY DAY   . fluticasone (FLONASE) 50 MCG/ACT nasal spray Place into the nose.   . furosemide (LASIX) 40 MG tablet Take by mouth.   Marland Kitchen glucose blood test strip USE 1 TIME DAILY AS INSTRUCTED   . levothyroxine (SYNTHROID, LEVOTHROID) 25 MCG tablet TAKE 1 TABLET EVERY DAY ON AN EMPTY STOMACH WITH A GLASS OF WATER AT LEAST 30 TO 60 MINUTES BEFORE BREAKFAST   . losartan (COZAAR) 25 MG tablet TAKE 1 TABLET EVERY DAY   . metFORMIN (GLUCOPHAGE) 500 MG tablet TAKE 1 TABLET TWICE DAILY WITH MEALS   . montelukast (SINGULAIR) 10 MG tablet Take by mouth. 10/19/2016: Not received  . progesterone (PROMETRIUM) 200 MG capsule Take 200 mg by mouth daily.   . progesterone (PROMETRIUM) 200 MG capsule TAKE 1 CAPSULE EVERY NIGHT    . ranitidine (ZANTAC) 300 MG tablet TAKE 1 TABLET TWICE DAILY   . vitamin B-12 (CYANOCOBALAMIN) 1000 MCG tablet Take 1,000 mcg by mouth daily.    Facility-Administered Encounter Medications as of 10/30/2016  Medication  . betamethasone acetate-betamethasone sodium phosphate (CELESTONE) injection 3 mg    Functional Status: In your present state of health, do you have any difficulty performing the following activities: 10/19/2016 08/24/2016  Hearing? N N  Vision? N N  Difficulty concentrating or making decisions? N N  Walking or climbing stairs? Y Y  Dressing or bathing? N N  Doing errands, shopping? [name]  Preparing Food and eating ? N N  Using the Toilet? N N  In the past six months, have you accidently leaked urine? N N  Do you have problems with loss of bowel control? N N  Managing your Medications? N N  Managing your Finances? N N  Housekeeping or managing your Housekeeping? N N  Some recent data might be hidden    Fall/Depression Screening: Fall Risk  10/19/2016 08/24/2016 08/04/2016  Falls in the past year? No No No  Comment - - -  Number falls in past yr: - - 1  Injury with Fall? - - Yes  Comment - - -  Risk Factor Category  High Fall Risk - High Fall Risk  Risk for fall due to : Impaired balance/gait;History of fall(s);Impaired mobility Impaired balance/gait;Impaired mobility Impaired balance/gait;Impaired mobility  Follow up Falls evaluation completed;Education provided;Falls prevention discussed - Falls  evaluation completed;Education provided;Falls prevention discussed   PHQ 2/9 Scores 10/19/2016 08/24/2016 08/04/2016 05/17/2016 03/24/2016 11/29/2015 10/25/2015  PHQ - 2 Score 0 0 0 0 0 0 0      Assessment:  Drugs sorted by system: Cardiovascular: Losartan, furosemide PRN  Endocrine: Progesterone, estradiol, levothyroxine, metformin  Pain: Tramadol  Vitamins/Minerals: Vitamin B-12, Vitamin C  I reviewed medications with patient for possible causes of dizziness.    Progesterone has 15-24% incidence of dizziness however patient states she has been taking this medication for years without any issues.  Losartan may cause orthostatic hypotension however patient states her usual blood pressure is 140/80.  I counseled patient to check her blood pressure after she has a episode of dizziness to see if it has dropped.    Tramadol may cause dizziness and vertigo (< 33%) however patient states this is a recent medication for her whereas her symptoms have been chronic.    Patient has a follow-up appointment with her PCP in August and cardiologist in November to further address her episodes of dizziness.  She will check her blood pressure and bring in a log of her readings to her appointments.  Patient has no further medication questions or concerns.     Plan: [place] will close patient's case for now but am happy to assist in the future as needed.  I will route my note to patient's PCP and  update [facility] RN [name].   Ralene Bathe, PharmD, [place] [phone]

## 2016-11-01 ENCOUNTER — Ambulatory Visit: Payer: Commercial Managed Care - HMO | Admitting: Orthotics

## 2016-11-01 DIAGNOSIS — E113393 Type 2 diabetes mellitus with moderate nonproliferative diabetic retinopathy without macular edema, bilateral: Secondary | ICD-10-CM | POA: Diagnosis not present

## 2016-11-01 DIAGNOSIS — M76822 Posterior tibial tendinitis, left leg: Secondary | ICD-10-CM

## 2016-11-01 NOTE — Progress Notes (Signed)
Patient presents today with a hx of PTTD/AAF.  Upon assessment, patient has pronounced pes planus w/ a valgus RF deformity.  Patient has medially shifted talus/navicular.  Patient was cast for a standard [place] AFO, lace/speed lace.   Going to add navicular sweet spot.

## 2016-11-02 DIAGNOSIS — E7800 Pure hypercholesterolemia, unspecified: Secondary | ICD-10-CM | POA: Diagnosis not present

## 2016-11-02 DIAGNOSIS — I10 Essential (primary) hypertension: Secondary | ICD-10-CM | POA: Diagnosis not present

## 2016-11-02 DIAGNOSIS — N183 Chronic kidney disease, stage 3 (moderate): Secondary | ICD-10-CM | POA: Diagnosis not present

## 2016-11-02 DIAGNOSIS — E1122 Type 2 diabetes mellitus with diabetic chronic kidney disease: Secondary | ICD-10-CM | POA: Diagnosis not present

## 2016-11-02 DIAGNOSIS — R6889 Other general symptoms and signs: Secondary | ICD-10-CM | POA: Diagnosis not present

## 2016-11-06 DIAGNOSIS — E042 Nontoxic multinodular goiter: Secondary | ICD-10-CM | POA: Diagnosis not present

## 2016-11-06 DIAGNOSIS — E1122 Type 2 diabetes mellitus with diabetic chronic kidney disease: Secondary | ICD-10-CM | POA: Diagnosis not present

## 2016-11-06 DIAGNOSIS — I10 Essential (primary) hypertension: Secondary | ICD-10-CM | POA: Diagnosis not present

## 2016-11-06 DIAGNOSIS — E7800 Pure hypercholesterolemia, unspecified: Secondary | ICD-10-CM | POA: Diagnosis not present

## 2016-11-06 DIAGNOSIS — N183 Chronic kidney disease, stage 3 (moderate): Secondary | ICD-10-CM | POA: Diagnosis not present

## 2016-11-10 DIAGNOSIS — E119 Type 2 diabetes mellitus without complications: Secondary | ICD-10-CM | POA: Diagnosis not present

## 2016-11-10 DIAGNOSIS — H18413 Arcus senilis, bilateral: Secondary | ICD-10-CM | POA: Diagnosis not present

## 2016-11-10 DIAGNOSIS — H2513 Age-related nuclear cataract, bilateral: Secondary | ICD-10-CM | POA: Diagnosis not present

## 2016-11-10 DIAGNOSIS — H02839 Dermatochalasis of unspecified eye, unspecified eyelid: Secondary | ICD-10-CM | POA: Diagnosis not present

## 2016-11-16 DIAGNOSIS — M48062 Spinal stenosis, lumbar region with neurogenic claudication: Secondary | ICD-10-CM | POA: Diagnosis not present

## 2016-11-16 DIAGNOSIS — M5416 Radiculopathy, lumbar region: Secondary | ICD-10-CM | POA: Diagnosis not present

## 2016-11-16 DIAGNOSIS — M5136 Other intervertebral disc degeneration, lumbar region: Secondary | ICD-10-CM | POA: Diagnosis not present

## 2016-11-21 ENCOUNTER — Other Ambulatory Visit: Payer: Self-pay | Admitting: *Deleted

## 2016-11-24 NOTE — Patient Outreach (Signed)
[facility]) Care Management  11/24/2016  Late entry   EMARI HREHA [birth year] [id number]  RN Health Coach telephone call to patient.  Hipaa compliance verified. Per patient she is doing pretty good. Her fasting blood sugar is 124. Patient is planning to get her catarct surgery done and patient is looking into diabetic shoes. Patient is call benefit department for places available in her area to purchase shoes and amount of co pay.    Current Medications:  Current Outpatient Prescriptions  Medication Sig Dispense Refill  . Ascorbic Acid (VITAMIN C) 1000 MG tablet Take by mouth daily.     Marland Kitchen estradiol (ESTRACE) 0.5 MG tablet TAKE 1 TABLET EVERY DAY    . furosemide (LASIX) 40 MG tablet Take 40 mg by mouth as needed for fluid or edema (for ankle swelling).    Marland Kitchen levothyroxine (SYNTHROID, LEVOTHROID) 25 MCG tablet TAKE 1 TABLET EVERY DAY ON AN EMPTY STOMACH WITH A GLASS OF WATER AT LEAST 30 TO 60 MINUTES BEFORE BREAKFAST    . losartan (COZAAR) 25 MG tablet TAKE 1 TABLET EVERY DAY    . metFORMIN (GLUCOPHAGE) 500 MG tablet TAKE 1 TABLET TWICE DAILY WITH MEALS    . progesterone (PROMETRIUM) 200 MG capsule TAKE 1 CAPSULE EVERY NIGHT    . traMADol (ULTRAM) 50 MG tablet Take 50 mg by mouth every 12 (twelve) hours as needed.    . vitamin B-12 (CYANOCOBALAMIN) 1000 MCG tablet Take 1,000 mcg by mouth daily.    Marland Kitchen ACCU-CHEK SMARTVIEW test strip 2 (two) times daily.      No current facility-administered medications for this visit.     Functional Status:  In your present state of health, do you have any difficulty performing the following activities: 11/21/2016 10/19/2016  Hearing? N N  Vision? N N  Difficulty concentrating or making decisions? Y N  Walking or climbing stairs? Y Y  Dressing or bathing? N N  Doing errands, shopping? [name]  Preparing Food and eating ? N N  Using the Toilet? N N  In the past six months, have you accidently leaked urine? N N  Do you have problems with  loss of bowel control? N N  Managing your Medications? N N  Managing your Finances? N N  Housekeeping or managing your Housekeeping? N N  Some recent data might be hidden    Fall/Depression Screening: Fall Risk  11/21/2016 10/19/2016 08/24/2016  Falls in the past year? No No No  Comment - - -  Number falls in past yr: - - -  Injury with Fall? - - -  Comment - - -  Risk Factor Category  High Fall Risk High Fall Risk -  Risk for fall due to : History of fall(s);Impaired balance/gait;Impaired mobility Impaired balance/gait;History of fall(s);Impaired mobility Impaired balance/gait;Impaired mobility  Follow up Falls evaluation completed;Education provided;Falls prevention discussed Falls evaluation completed;Education provided;Falls prevention discussed -   PHQ 2/9 Scores 11/21/2016 10/19/2016 08/24/2016 08/04/2016 05/17/2016 03/24/2016 11/29/2015  PHQ - 2 Score 0 0 0 0 0 0 0   THN CM Care Plan Problem One     Most Recent Value  Care Plan Problem One  knowledge deficit in self management of diabetes  Role Documenting the Problem One  [place] for Problem One  Active  THN Long Term Goal   Patient will continue to not have any admissions for diabetes in 90 days  Interventions for Problem One Long Term Goal  RN reminded the  patient to keep her appointment with physician. RN reminded patient to take medications as prescribed. RN will  follow up  monthly.  THN CM Short Term Goal #3  Patient will report making appointments and Maintaining Health checks within the next 30 days  THN CM Short Term Goal #3 Start Date  11/21/16  Interventions for Short Tern Goal #3  RN discussed maintaining overall health checks such as eye exams, podiatry,detal,and hearing exams. RN will follow up with further discussion       Assessment Fasting blood sugars is 124 and stable Patient is planning cataract surgery  Patient does not have diabetic shoes  Plan:  Patient will look into obtaining diabetic  shoes Patient will schedule cataract surgery RN will follow up within the month of October [facility] [phone]

## 2016-11-29 ENCOUNTER — Other Ambulatory Visit: Payer: Commercial Managed Care - HMO | Admitting: Orthotics

## 2016-12-06 ENCOUNTER — Ambulatory Visit (INDEPENDENT_AMBULATORY_CARE_PROVIDER_SITE_OTHER): Payer: Medicare HMO | Admitting: Orthotics

## 2016-12-06 DIAGNOSIS — M76822 Posterior tibial tendinitis, left leg: Secondary | ICD-10-CM | POA: Diagnosis not present

## 2016-12-06 NOTE — Progress Notes (Signed)
Patient came in today to pick up standard Afo brace.  Patient was evaluated for fit and function.   The brace fit very well and there were any complaints of the way it felt once donned.  The brace offered ankle stability in both saggital and coroneal planes.  Patient advised to always wear proper fitting shoes with brace. 

## 2016-12-08 ENCOUNTER — Ambulatory Visit: Payer: Medicare HMO | Admitting: Podiatry

## 2016-12-19 ENCOUNTER — Other Ambulatory Visit: Payer: Self-pay | Admitting: *Deleted

## 2016-12-19 DIAGNOSIS — E0800 Diabetes mellitus due to underlying condition with hyperosmolarity without nonketotic hyperglycemic-hyperosmolar coma (NKHHC): Secondary | ICD-10-CM

## 2016-12-19 NOTE — Patient Outreach (Signed)
[facility]) Care Management  12/19/2016   [place] 07-08-1934 [id number]  RN Health Coach telephone call to patient.  Hipaa compliance verified. Per patient her fasting blood sugar was 119. Per patient she needs a new meter. Patient has ordered one. Per patient her appetite is not very good. She stated she just doesn't feel hungry. Patient has been using supplemental nutrition but has ran out of coupons. Patient uses a rollator walker to get around. Per patient she is still having some pain in her back on a scale of 4-6 in pain. Per patient she has ordered diabetic shoes but they are to0 small with the orthotics in them. She is getting a larger size. Patient is not currently driving. She is using the [place] transportation to get to Dr appointments. Patient is having the first of the cataract surgeries this month. Patient has agreed to follow up outreach calls and referral to Education officer, museum.   Objective:   Current Medications:  Current Outpatient Prescriptions  Medication Sig Dispense Refill  . ACCU-CHEK SMARTVIEW test strip 2 (two) times daily.     . Ascorbic Acid (VITAMIN C) 1000 MG tablet Take by mouth daily.     Marland Kitchen estradiol (ESTRACE) 0.5 MG tablet TAKE 1 TABLET EVERY DAY    . furosemide (LASIX) 40 MG tablet Take 40 mg by mouth as needed for fluid or edema (for ankle swelling).    Marland Kitchen levothyroxine (SYNTHROID, LEVOTHROID) 25 MCG tablet TAKE 1 TABLET EVERY DAY ON AN EMPTY STOMACH WITH A GLASS OF WATER AT LEAST 30 TO 60 MINUTES BEFORE BREAKFAST    . losartan (COZAAR) 25 MG tablet TAKE 1 TABLET EVERY DAY    . metFORMIN (GLUCOPHAGE) 500 MG tablet TAKE 1 TABLET TWICE DAILY WITH MEALS    . progesterone (PROMETRIUM) 200 MG capsule TAKE 1 CAPSULE EVERY NIGHT    . traMADol (ULTRAM) 50 MG tablet Take 50 mg by mouth every 12 (twelve) hours as needed.    . vitamin B-12 (CYANOCOBALAMIN) 1000 MCG tablet Take 1,000 mcg by mouth daily.     No current facility-administered  medications for this visit.     Functional Status:  In your present state of health, do you have any difficulty performing the following activities: 12/19/2016 11/21/2016  Hearing? N N  Vision? N N  Difficulty concentrating or making decisions? [name]  Walking or climbing stairs? Y Y  Dressing or bathing? N N  Doing errands, shopping? [name]  Preparing Food and eating ? N N  Using the Toilet? N N  In the past six months, have you accidently leaked urine? N N  Do you have problems with loss of bowel control? N N  Managing your Medications? N N  Managing your Finances? N N  Housekeeping or managing your Housekeeping? N N  Some recent data might be hidden    Fall/Depression Screening: Fall Risk  12/19/2016 11/21/2016 10/19/2016  Falls in the past year? No No No  Comment - - -  Number falls in past yr: 1 - -  Injury with Fall? Yes - -  Comment - - -  Risk Factor Category  High Fall Risk High Fall Risk High Fall Risk  Risk for fall due to : History of fall(s);Impaired balance/gait;Impaired mobility History of fall(s);Impaired balance/gait;Impaired mobility Impaired balance/gait;History of fall(s);Impaired mobility  Follow up Falls evaluation completed;Education provided;Falls prevention discussed Falls evaluation completed;Education provided;Falls prevention discussed Falls evaluation completed;Education provided;Falls prevention discussed   PHQ 2/9 Scores 12/19/2016  11/21/2016 10/19/2016 08/24/2016 08/04/2016 05/17/2016 03/24/2016  PHQ - 2 Score 3 0 0 0 0 0 0  PHQ- 9 Score 12 - - - - - -   THN CM Care Plan Problem One     Most Recent Value  Care Plan Problem One  knowledge deficit in self management of diabetes  Role Documenting the Problem One  [facility] [place] for Problem One  Active  THN Long Term Goal   Patient will continue to not have any admissions for diabetes in 90 days  Interventions for Problem One Long Term Goal  RN reminded the patient to keep her appointment with physician. RN  reminded patient to take medications as prescribed. RN will  follow up  monthly.  THN CM Short Term Goal #3 Start Date  12/19/16      Assessment:  Patient needs new meter Patient is having some depression Patient is having cataract surgery this month Patient has not started classes at the [facility] but is pending after surgery Patient will continue to  benefit from [place] telephonic outreach for education and support for diabetes self management. Plan:  Refer to social worker Cataract surgery 10/21 New diabetic shoes ordered New meter ordered RN will follow up  Outreach within the month of November Patient  has follow appointment with physician in December  Alexsys Eskin [facility] [place] [facility] [phone]

## 2016-12-20 ENCOUNTER — Ambulatory Visit: Payer: Medicare HMO | Admitting: Orthotics

## 2016-12-20 ENCOUNTER — Other Ambulatory Visit: Payer: Self-pay | Admitting: Obstetrics and Gynecology

## 2016-12-20 DIAGNOSIS — Z1231 Encounter for screening mammogram for malignant neoplasm of breast: Secondary | ICD-10-CM

## 2016-12-20 DIAGNOSIS — Z01419 Encounter for gynecological examination (general) (routine) without abnormal findings: Secondary | ICD-10-CM | POA: Diagnosis not present

## 2016-12-20 DIAGNOSIS — Z124 Encounter for screening for malignant neoplasm of cervix: Secondary | ICD-10-CM | POA: Diagnosis not present

## 2016-12-20 DIAGNOSIS — Z1211 Encounter for screening for malignant neoplasm of colon: Secondary | ICD-10-CM | POA: Diagnosis not present

## 2016-12-25 ENCOUNTER — Other Ambulatory Visit: Payer: Self-pay | Admitting: *Deleted

## 2016-12-25 NOTE — Patient Outreach (Addendum)
[facility]) Care Management  12/25/2016  Sherry [name] Aug 26, 1934 [id number]   Patient referred to this social worker by health [place] to provided patient with mental health resources. Per patient, she does not feel that she is depressed. However states that she has been through a lot in her life. Patient states that "everyone seems to think that I need counseling because I live alone". Patient states that she stays in a lot because she is afraid that she may fall. She will only go out if she has to and then will look for someone to help her. Patient is open to receiving referrals for mental health counseling however states that she cannot guarantee that she will use them.   Plan: This social worker will send patient a  list of in network mental health providers to her home to utilize when needed.           This social worker will contact patient within 2 weeks to ensure she has received the resources mailed to her.   Sherry [name] [facility] Care Management [phone]

## 2016-12-29 ENCOUNTER — Ambulatory Visit: Payer: Medicare HMO | Admitting: Podiatry

## 2017-01-02 ENCOUNTER — Other Ambulatory Visit: Payer: Self-pay | Admitting: *Deleted

## 2017-01-08 DIAGNOSIS — H2511 Age-related nuclear cataract, right eye: Secondary | ICD-10-CM | POA: Diagnosis not present

## 2017-01-09 ENCOUNTER — Ambulatory Visit: Payer: Medicare HMO | Admitting: Podiatry

## 2017-01-09 DIAGNOSIS — H2511 Age-related nuclear cataract, right eye: Secondary | ICD-10-CM | POA: Diagnosis not present

## 2017-01-09 DIAGNOSIS — H2512 Age-related nuclear cataract, left eye: Secondary | ICD-10-CM | POA: Diagnosis not present

## 2017-01-16 ENCOUNTER — Ambulatory Visit (INDEPENDENT_AMBULATORY_CARE_PROVIDER_SITE_OTHER): Payer: Medicare HMO | Admitting: Podiatry

## 2017-01-16 DIAGNOSIS — M2041 Other hammer toe(s) (acquired), right foot: Secondary | ICD-10-CM | POA: Diagnosis not present

## 2017-01-16 DIAGNOSIS — M2042 Other hammer toe(s) (acquired), left foot: Secondary | ICD-10-CM

## 2017-01-16 DIAGNOSIS — M76822 Posterior tibial tendinitis, left leg: Secondary | ICD-10-CM

## 2017-01-16 DIAGNOSIS — M21619 Bunion of unspecified foot: Secondary | ICD-10-CM | POA: Diagnosis not present

## 2017-01-18 NOTE — Progress Notes (Signed)
Patient ID: Sherry [name], female   DOB: [birth year], 82 y.o.   MRN: [id number]   HPI: 82 year old female presents the office for a diabetic foot exam. She states she needs a wider shoe and is requesting diabetic shoes. She picked up AFO brace on 12/06/16.  Patient presents today for further treatment and evaluation.   Past Medical History:  Diagnosis Date  . Allergy   . Diabetes mellitus without complication ([place])   . Thyroid disease       Physical Exam: General: The patient is alert and oriented x3 in no acute distress.  Dermatology: Skin is warm, dry and supple bilateral lower extremities. Negative for open lesions or macerations.  Vascular: Palpable pedal pulses bilaterally. Bilateral lower extremity pitting edema noted. Capillary refill within normal limits.  Neurological: Epicritic and protective threshold grossly intact bilaterally.   Musculoskeletal Exam: Collapses medial longitudinal arch is noted consistent with posterior tibial tendon dysfunction. There appears to be some significant arthritic changes noted to the left foot and ankle with limited range of motion. Muscle strength 5/5 in all groups bilateral.  Clinical evidence of bunion deformity noted to the respective foot. There is a moderate pain on palpation range of motion of the first MPJ. Lateral deviation of the hallux noted consistent with hallux abductovalgus. Hammertoe contracture also noted on clinical exam to digits 2-5 of the bilateral feet. Symptomatic pain on palpation and range of motion also noted to the metatarsal phalangeal joints of the respective hammertoe digits.     Assessment: 1. Posterior tibial tendon dysfunction left lower extremity 2. DM with neuropathy 3. Chronic bilateral lower extremity edema 4. 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 Hammertoe deformity toes 2-5 bilateral lower extremity  Plan of Care:  1. Patient was evaluated.  2. Appointment with Liliane Channel for DM shoes. 3.  Continue below-knee compression socks. 4. Continue wearing AFO brace. 5. Return to clinic when necessary.   Edrick Kins, DPM [facility]  Dr. Edrick Kins, DPM    [address], [place] [address]                Office [phone]  Fax [phone]

## 2017-01-19 ENCOUNTER — Ambulatory Visit: Payer: Self-pay | Admitting: *Deleted

## 2017-01-23 ENCOUNTER — Other Ambulatory Visit: Payer: Self-pay | Admitting: *Deleted

## 2017-01-23 NOTE — Patient Outreach (Signed)
[facility] [facility]) Care Management  01/23/2017   [place] October 20, 1934 [id number]  RN Health Coach telephone call to patient.  Hipaa compliance verified. Per patient her fasting blood sugar was 120. Patient states she is doing much better . Per patient she is having very little to no pain. Patient had rt cataract surgery done last month. Patient had received a new glucometer. Patient asked about getting a Free style libre. RN explained that guidelines for obtaining one. Patient had received a pair of diabetic shoes but per patient they did not fit good. Patient instructed last outreach to follow up and patient did. Patient stated her appetite is better but she does like to drink glucerna in between. Patient has agreed to follow up outreach calls.   Current Medications:  Current Outpatient Medications  Medication Sig Dispense Refill  . ACCU-CHEK SMARTVIEW test strip 2 (two) times daily.     . Ascorbic Acid (VITAMIN C) 1000 MG tablet Take by mouth daily.     Marland Kitchen estradiol (ESTRACE) 0.5 MG tablet TAKE 1 TABLET EVERY DAY    . furosemide (LASIX) 40 MG tablet Take 40 mg by mouth as needed for fluid or edema (for ankle swelling).    Marland Kitchen levothyroxine (SYNTHROID, LEVOTHROID) 25 MCG tablet TAKE 1 TABLET EVERY DAY ON AN EMPTY STOMACH WITH A GLASS OF WATER AT LEAST 30 TO 60 MINUTES BEFORE BREAKFAST    . losartan (COZAAR) 25 MG tablet TAKE 1 TABLET EVERY DAY    . metFORMIN (GLUCOPHAGE) 500 MG tablet TAKE 1 TABLET TWICE DAILY WITH MEALS    . progesterone (PROMETRIUM) 200 MG capsule TAKE 1 CAPSULE EVERY NIGHT    . traMADol (ULTRAM) 50 MG tablet Take 50 mg by mouth every 12 (twelve) hours as needed.    . vitamin B-12 (CYANOCOBALAMIN) 1000 MCG tablet Take 1,000 mcg by mouth daily.     No current facility-administered medications for this visit.     Functional Status:  In your present state of health, do you have any difficulty performing the following activities: 01/23/2017 12/19/2016   Hearing? N N  Vision? Y N  Difficulty concentrating or making decisions? [name]  Walking or climbing stairs? Y Y  Dressing or bathing? N N  Doing errands, shopping? [name]  Preparing Food and eating ? N N  Using the Toilet? N N  In the past six months, have you accidently leaked urine? N N  Do you have problems with loss of bowel control? N N  Managing your Medications? N N  Managing your Finances? N N  Housekeeping or managing your Housekeeping? N N  Some recent data might be hidden    Fall/Depression Screening: Fall Risk  01/23/2017 12/19/2016 11/21/2016  Falls in the past year? No No No  Comment - - -  Number falls in past yr: - 1 -  Injury with Fall? Yes Yes -  Comment - - -  Risk Factor Category  High Fall Risk High Fall Risk High Fall Risk  Risk for fall due to : History of fall(s);Impaired balance/gait;Impaired mobility History of fall(s);Impaired balance/gait;Impaired mobility History of fall(s);Impaired balance/gait;Impaired mobility  Follow up Falls prevention discussed Falls evaluation completed;Education provided;Falls prevention discussed Falls evaluation completed;Education provided;Falls prevention discussed   PHQ 2/9 Scores 01/23/2017 12/19/2016 11/21/2016 10/19/2016 08/24/2016 08/04/2016 05/17/2016  PHQ - 2 Score 3 3 0 0 0 0 0  PHQ- 9 Score 12 12 - - - - -   THN CM Care Plan Problem One  Most Recent Value  Care Plan Problem One  knowledge deficit in self management of diabetes  Role Documenting the Problem One  [place] for Problem One  Active  THN Long Term Goal   Patient will continue to not have any admissions for diabetes in 90 days  Interventions for Problem One Long Term Goal  RN reminded the patient to keep her appointment with physician. RN reminded patient to take medications as prescribed. RN will  follow up  monthly.  THN CM Short Term Goal #1   Patient will verbalize receiving Diabetic shoes that fit appropiately within the next 30 days  THN CM  Short Term Goal #1 Start Date  01/23/17  Interventions for Short Term Goal #1  RN discussed with patient about shoes that are uncomfortable. RN discussed with patient about talking with company shoes ordered.RN will follow up with patient  THN CM Short Term Goal #2   Patient will verbalize having the left cataract surgery within the next 30 days  THN CM Short Term Goal #2 Start Date  01/23/17  Interventions for Short Term Goal #2  RN discussed with patient about following through with the catarct surgery. RN will follow up with patient for compliance  THN CM Short Term Goal #3  Patient will report making appointments and Maintaining Health checks within the next 30 days  Interventions for Short Tern Goal #3  RN discussed maintaining overall health checks such as eye exams, podiatry,detal,and hearing exams. RN will follow up with further discussion       Assessment:  Patient fasting blood sugar is 120 today Patient had ordered diabetic shoes and is being refitted Patient had rt cataract surgery done Patient is checking blood sugars as per ordered Patient is taking medications as per ordered Patient will continue to  benefit from [place] telephonic outreach for education and support for diabetes self management.   Plan:  Patient will have cataract surgery on Mon 12/29/2016 Patient has follow upappoitment with PCP in December Patient has follow up appointment with cardiology in December Patient will have a refitting for diabetic shoes RN discussed medication adherence RN discussed transportation RN sent patient a 2019 calendar book for documentation of blood sugars RN sent patient Glucerna discount coupons  [place] Management [phone]

## 2017-01-26 DIAGNOSIS — Z6828 Body mass index (BMI) 28.0-28.9, adult: Secondary | ICD-10-CM | POA: Diagnosis not present

## 2017-01-26 DIAGNOSIS — M19042 Primary osteoarthritis, left hand: Secondary | ICD-10-CM | POA: Diagnosis not present

## 2017-01-26 DIAGNOSIS — E119 Type 2 diabetes mellitus without complications: Secondary | ICD-10-CM | POA: Diagnosis not present

## 2017-01-26 DIAGNOSIS — I10 Essential (primary) hypertension: Secondary | ICD-10-CM | POA: Diagnosis not present

## 2017-01-26 DIAGNOSIS — M19041 Primary osteoarthritis, right hand: Secondary | ICD-10-CM | POA: Diagnosis not present

## 2017-01-29 DIAGNOSIS — H2512 Age-related nuclear cataract, left eye: Secondary | ICD-10-CM | POA: Diagnosis not present

## 2017-02-07 ENCOUNTER — Other Ambulatory Visit: Payer: Medicare HMO | Admitting: Orthotics

## 2017-02-15 DIAGNOSIS — I208 Other forms of angina pectoris: Secondary | ICD-10-CM | POA: Diagnosis not present

## 2017-02-15 DIAGNOSIS — R600 Localized edema: Secondary | ICD-10-CM | POA: Diagnosis not present

## 2017-02-15 DIAGNOSIS — E119 Type 2 diabetes mellitus without complications: Secondary | ICD-10-CM | POA: Diagnosis not present

## 2017-02-15 DIAGNOSIS — K219 Gastro-esophageal reflux disease without esophagitis: Secondary | ICD-10-CM | POA: Diagnosis not present

## 2017-02-15 DIAGNOSIS — R0609 Other forms of dyspnea: Secondary | ICD-10-CM | POA: Diagnosis not present

## 2017-02-15 DIAGNOSIS — R0602 Shortness of breath: Secondary | ICD-10-CM | POA: Diagnosis not present

## 2017-02-15 DIAGNOSIS — E782 Mixed hyperlipidemia: Secondary | ICD-10-CM | POA: Diagnosis not present

## 2017-02-15 DIAGNOSIS — N183 Chronic kidney disease, stage 3 (moderate): Secondary | ICD-10-CM | POA: Diagnosis not present

## 2017-02-15 DIAGNOSIS — I10 Essential (primary) hypertension: Secondary | ICD-10-CM | POA: Diagnosis not present

## 2017-02-20 ENCOUNTER — Ambulatory Visit: Payer: Medicare HMO | Admitting: Orthotics

## 2017-02-22 ENCOUNTER — Other Ambulatory Visit: Payer: Self-pay | Admitting: *Deleted

## 2017-02-22 NOTE — Patient Outreach (Signed)
[facility]) Care Management  02/22/2017  [place] 02-Jun-1934 [id number]  RN Health Coach attempted #1 follow up outreach call to patient.  Patient was unavailable. HIPPA compliance voicemail message left with return callback number.  Plan: RN will call patient again within 14 days.  [place] Care Management [phone]

## 2017-02-23 ENCOUNTER — Other Ambulatory Visit: Payer: Self-pay | Admitting: Physical Medicine and Rehabilitation

## 2017-02-23 DIAGNOSIS — M48062 Spinal stenosis, lumbar region with neurogenic claudication: Secondary | ICD-10-CM | POA: Diagnosis not present

## 2017-02-23 DIAGNOSIS — M5416 Radiculopathy, lumbar region: Secondary | ICD-10-CM | POA: Diagnosis not present

## 2017-02-23 DIAGNOSIS — M5136 Other intervertebral disc degeneration, lumbar region: Secondary | ICD-10-CM | POA: Diagnosis not present

## 2017-03-01 ENCOUNTER — Other Ambulatory Visit: Payer: Self-pay | Admitting: *Deleted

## 2017-03-01 NOTE — Patient Outreach (Signed)
[place] [facility] Care Management  03/01/2017   Sherry [name] November 13, 1934 [id number]  Subjective: RN Health Coach telephone call to patient.  Hipaa compliance verified. Patient fasting blood sugar is 120 today. Patient stated she has had the cataract surgery. Patient is in a lot of back pain. RN discussed with patient about taking her medications and heat therapy. Patient is having a MRI done on Monday. Per patient she has received her new meter. Patient has not received her new diabetic shoes yet. Patient is unable to exercise at this time due to the pain. RN discussed with patient about medical alert button. Patient has a remote security system button that she is able to carry around as a medical alert. Patient has agreed to further outreach calls.   Current Medications:  Current Outpatient Medications  Medication Sig Dispense Refill  . ACCU-CHEK SMARTVIEW test strip 2 (two) times daily.     . Ascorbic Acid (VITAMIN C) 1000 MG tablet Take by mouth daily.     Marland Kitchen estradiol (ESTRACE) 0.5 MG tablet TAKE 1 TABLET EVERY DAY    . furosemide (LASIX) 40 MG tablet Take 40 mg by mouth as needed for fluid or edema (for ankle swelling).    Marland Kitchen levothyroxine (SYNTHROID, LEVOTHROID) 25 MCG tablet TAKE 1 TABLET EVERY DAY ON AN EMPTY STOMACH WITH A GLASS OF WATER AT LEAST 30 TO 60 MINUTES BEFORE BREAKFAST    . losartan (COZAAR) 25 MG tablet TAKE 1 TABLET EVERY DAY    . metFORMIN (GLUCOPHAGE) 500 MG tablet TAKE 1 TABLET TWICE DAILY WITH MEALS    . progesterone (PROMETRIUM) 200 MG capsule TAKE 1 CAPSULE EVERY NIGHT    . traMADol (ULTRAM) 50 MG tablet Take 50 mg by mouth every 12 (twelve) hours as needed.    . vitamin B-12 (CYANOCOBALAMIN) 1000 MCG tablet Take 1,000 mcg by mouth daily.     No current facility-administered medications for this visit.     Functional Status:  In your present state of health, do you have any difficulty performing the following activities: 03/01/2017 01/23/2017   Hearing? N N  Vision? Y Y  Difficulty concentrating or making decisions? [name]  Walking or climbing stairs? Y Y  Dressing or bathing? N N  Doing errands, shopping? [name]  Preparing Food and eating ? N N  Using the Toilet? N N  In the past six months, have you accidently leaked urine? N N  Do you have problems with loss of bowel control? N N  Managing your Medications? N N  Managing your Finances? N N  Housekeeping or managing your Housekeeping? N N  Some recent data might be hidden    Fall/Depression Screening: Fall Risk  03/01/2017 01/23/2017 12/19/2016  Falls in the past year? No No No  Comment - - -  Number falls in past yr: - - 1  Injury with Fall? - Yes Yes  Comment - - -  Risk Factor Category  High Fall Risk High Fall Risk High Fall Risk  Risk for fall due to : History of fall(s);Impaired balance/gait;Impaired mobility History of fall(s);Impaired balance/gait;Impaired mobility History of fall(s);Impaired balance/gait;Impaired mobility  Follow up Falls evaluation completed;Falls prevention discussed Falls prevention discussed Falls evaluation completed;Education provided;Falls prevention discussed   PHQ 2/9 Scores 03/01/2017 01/23/2017 12/19/2016 11/21/2016 10/19/2016 08/24/2016 08/04/2016  PHQ - 2 Score 0 3 3 0 0 0 0  PHQ- 9 Score '5 12 12 ' - - - -   THN CM Care Plan Problem One  Most Recent Value  Care Plan Problem One  knowledge deficit in self management of diabetes  Role Documenting the Problem One  [place] for Problem One  Active  THN Long Term Goal Start Date  03/01/17  Interventions for Problem One Long Term Goal  RN reiterated to patient to keep her appointment with physician. RN reminded patient to take medications as prescribed. RN will  follow up  monthly.  THN CM Short Term Goal #1   Patient will verbalize receiving Diabetic shoes that fit appropiately within the next 30 days  THN CM Short Term Goal #1 Start Date  03/01/17  Interventions for Short Term  Goal #1  RN discussed with patient about shoes that are uncomfortable. RN discussed with patient about talking with company shoes ordered.RN will follow up with patient  THN CM Short Term Goal #2   Patient will verbalize having the left cataract surgery within the next 30 days  THN CM Short Term Goal #2 Start Date  03/01/17  [facility] CM Short Term Goal #2 Met Date  03/01/17  Interventions for Short Term Goal #2  RN discussed with patient about following through with the catarct surgery. RN will follow up with patient for compliance  THN CM Short Term Goal #3  Patient will follow up with physician regarding back pain within the next 30 days  THN CM Short Term Goal #3 Start Date  03/01/17  Interventions for Short Tern Goal #3  Rn discussed withpatient about back pain. RN discussed taking medications as prescrined, applying heat. Patient has MRI set for monday. RN will follow up outreach       Assessment:  Fastin blood sugar is 120 Patient has severe chronic back pain Received new meter Has not received new diabetic shoes Patient had cataract surgery Plan:  RN discussed new diabetic shoes RN discussed heat therapy with back pain RN discussed medication adherence RN will follow up within the month of January Patient is having  MRI don on 03/05/2017  [place] Management [phone]

## 2017-03-05 ENCOUNTER — Ambulatory Visit
Admission: RE | Admit: 2017-03-05 | Discharge: 2017-03-05 | Disposition: A | Discharge status: Home / Self Care | Payer: Medicare HMO | Source: Ambulatory Visit | Attending: Physical Medicine and Rehabilitation | Admitting: Physical Medicine and Rehabilitation

## 2017-03-05 DIAGNOSIS — M48061 Spinal stenosis, lumbar region without neurogenic claudication: Secondary | ICD-10-CM | POA: Insufficient documentation

## 2017-03-05 DIAGNOSIS — M5416 Radiculopathy, lumbar region: Secondary | ICD-10-CM

## 2017-03-05 DIAGNOSIS — E042 Nontoxic multinodular goiter: Secondary | ICD-10-CM | POA: Diagnosis not present

## 2017-03-05 DIAGNOSIS — E7800 Pure hypercholesterolemia, unspecified: Secondary | ICD-10-CM | POA: Diagnosis not present

## 2017-03-05 DIAGNOSIS — M47816 Spondylosis without myelopathy or radiculopathy, lumbar region: Secondary | ICD-10-CM | POA: Diagnosis not present

## 2017-03-05 DIAGNOSIS — E1122 Type 2 diabetes mellitus with diabetic chronic kidney disease: Secondary | ICD-10-CM | POA: Diagnosis not present

## 2017-03-05 DIAGNOSIS — N183 Chronic kidney disease, stage 3 (moderate): Secondary | ICD-10-CM | POA: Diagnosis not present

## 2017-03-05 DIAGNOSIS — I10 Essential (primary) hypertension: Secondary | ICD-10-CM | POA: Diagnosis not present

## 2017-03-05 DIAGNOSIS — M545 Low back pain: Secondary | ICD-10-CM | POA: Diagnosis not present

## 2017-03-07 ENCOUNTER — Ambulatory Visit: Payer: Self-pay | Admitting: *Deleted

## 2017-03-08 DIAGNOSIS — E1122 Type 2 diabetes mellitus with diabetic chronic kidney disease: Secondary | ICD-10-CM | POA: Diagnosis not present

## 2017-03-08 DIAGNOSIS — E7800 Pure hypercholesterolemia, unspecified: Secondary | ICD-10-CM | POA: Diagnosis not present

## 2017-03-08 DIAGNOSIS — E042 Nontoxic multinodular goiter: Secondary | ICD-10-CM | POA: Diagnosis not present

## 2017-03-08 DIAGNOSIS — I10 Essential (primary) hypertension: Secondary | ICD-10-CM | POA: Diagnosis not present

## 2017-03-08 DIAGNOSIS — N183 Chronic kidney disease, stage 3 (moderate): Secondary | ICD-10-CM | POA: Diagnosis not present

## 2017-03-08 DIAGNOSIS — R399 Unspecified symptoms and signs involving the genitourinary system: Secondary | ICD-10-CM | POA: Diagnosis not present

## 2017-03-14 DIAGNOSIS — M48062 Spinal stenosis, lumbar region with neurogenic claudication: Secondary | ICD-10-CM | POA: Diagnosis not present

## 2017-03-14 DIAGNOSIS — M5416 Radiculopathy, lumbar region: Secondary | ICD-10-CM | POA: Diagnosis not present

## 2017-03-14 DIAGNOSIS — M5136 Other intervertebral disc degeneration, lumbar region: Secondary | ICD-10-CM | POA: Diagnosis not present

## 2017-03-27 ENCOUNTER — Encounter (HOSPITAL_COMMUNITY): Payer: Self-pay | Admitting: Internal Medicine

## 2017-03-27 ENCOUNTER — Emergency Department (HOSPITAL_COMMUNITY): Payer: Medicare HMO

## 2017-03-27 ENCOUNTER — Other Ambulatory Visit: Payer: Self-pay

## 2017-03-27 ENCOUNTER — Observation Stay (HOSPITAL_COMMUNITY)
Admission: EM | Admit: 2017-03-27 | Discharge: 2017-03-29 | Disposition: A | Discharge status: Still In Hospital | Payer: Medicare HMO | Attending: Internal Medicine | Admitting: Internal Medicine

## 2017-03-27 DIAGNOSIS — W19XXXA Unspecified fall, initial encounter: Secondary | ICD-10-CM | POA: Insufficient documentation

## 2017-03-27 DIAGNOSIS — R2689 Other abnormalities of gait and mobility: Secondary | ICD-10-CM | POA: Insufficient documentation

## 2017-03-27 DIAGNOSIS — S0181XA Laceration without foreign body of other part of head, initial encounter: Secondary | ICD-10-CM | POA: Diagnosis not present

## 2017-03-27 DIAGNOSIS — F17210 Nicotine dependence, cigarettes, uncomplicated: Secondary | ICD-10-CM | POA: Diagnosis not present

## 2017-03-27 DIAGNOSIS — E1122 Type 2 diabetes mellitus with diabetic chronic kidney disease: Secondary | ICD-10-CM | POA: Diagnosis not present

## 2017-03-27 DIAGNOSIS — Z79899 Other long term (current) drug therapy: Secondary | ICD-10-CM | POA: Diagnosis not present

## 2017-03-27 DIAGNOSIS — D631 Anemia in chronic kidney disease: Secondary | ICD-10-CM | POA: Insufficient documentation

## 2017-03-27 DIAGNOSIS — Z880 Allergy status to penicillin: Secondary | ICD-10-CM | POA: Diagnosis not present

## 2017-03-27 DIAGNOSIS — I129 Hypertensive chronic kidney disease with stage 1 through stage 4 chronic kidney disease, or unspecified chronic kidney disease: Secondary | ICD-10-CM | POA: Insufficient documentation

## 2017-03-27 DIAGNOSIS — Z6830 Body mass index (BMI) 30.0-30.9, adult: Secondary | ICD-10-CM | POA: Diagnosis not present

## 2017-03-27 DIAGNOSIS — R52 Pain, unspecified: Secondary | ICD-10-CM | POA: Diagnosis present

## 2017-03-27 DIAGNOSIS — Z7984 Long term (current) use of oral hypoglycemic drugs: Secondary | ICD-10-CM | POA: Insufficient documentation

## 2017-03-27 DIAGNOSIS — E669 Obesity, unspecified: Secondary | ICD-10-CM | POA: Insufficient documentation

## 2017-03-27 DIAGNOSIS — I10 Essential (primary) hypertension: Secondary | ICD-10-CM | POA: Diagnosis present

## 2017-03-27 DIAGNOSIS — N183 Chronic kidney disease, stage 3 unspecified: Secondary | ICD-10-CM

## 2017-03-27 DIAGNOSIS — E0520 Thyrotoxicosis with toxic multinodular goiter without thyrotoxic crisis or storm: Secondary | ICD-10-CM | POA: Insufficient documentation

## 2017-03-27 DIAGNOSIS — E039 Hypothyroidism, unspecified: Secondary | ICD-10-CM | POA: Diagnosis not present

## 2017-03-27 DIAGNOSIS — E7800 Pure hypercholesterolemia, unspecified: Secondary | ICD-10-CM | POA: Insufficient documentation

## 2017-03-27 DIAGNOSIS — R55 Syncope and collapse: Secondary | ICD-10-CM | POA: Diagnosis not present

## 2017-03-27 LAB — COMPREHENSIVE METABOLIC PANEL
ALBUMIN: 3.5 g/dL (ref 3.5–5.0)
ALT: 12 U/L — ABNORMAL LOW (ref 14–54)
ANION GAP: 9 (ref 5–15)
AST: 16 U/L (ref 15–41)
Alkaline Phosphatase: 53 U/L (ref 38–126)
BUN: 19 mg/dL (ref 6–20)
CO2: 25 mmol/L (ref 22–32)
Calcium: 10.1 mg/dL (ref 8.9–10.3)
Chloride: 104 mmol/L (ref 101–111)
Creatinine, Ser: 1.07 mg/dL — ABNORMAL HIGH (ref 0.44–1.00)
GFR calc Af Amer: 55 mL/min — ABNORMAL LOW (ref >60)
GFR calc non Af Amer: 48 mL/min — ABNORMAL LOW (ref >60)
GLUCOSE: 123 mg/dL — AB (ref 65–99)
POTASSIUM: 4 mmol/L (ref 3.5–5.1)
SODIUM: 138 mmol/L (ref 135–145)
Total Bilirubin: 1.3 mg/dL — ABNORMAL HIGH (ref 0.3–1.2)
Total Protein: 6.8 g/dL (ref 6.5–8.1)

## 2017-03-27 LAB — CBC WITH DIFFERENTIAL/PLATELET
Basophils Absolute: 0 10*3/uL (ref 0.0–0.1)
Basophils Relative: 0 %
Eosinophils Absolute: 0.1 10*3/uL (ref 0.0–0.7)
Eosinophils Relative: 1 %
HEMATOCRIT: 35.9 % — AB (ref 36.0–46.0)
Hemoglobin: 11.2 g/dL — ABNORMAL LOW (ref 12.0–15.0)
LYMPHS ABS: 1.7 10*3/uL (ref 0.7–4.0)
LYMPHS PCT: 15 %
MCH: 26.3 pg (ref 26.0–34.0)
MCHC: 31.2 g/dL (ref 30.0–36.0)
MCV: 84.3 fL (ref 78.0–100.0)
MONOS PCT: 7 %
Monocytes Absolute: 0.8 10*3/uL (ref 0.1–1.0)
NEUTROS ABS: 8.3 10*3/uL — AB (ref 1.7–7.7)
Neutrophils Relative %: 77 %
Platelets: 318 10*3/uL (ref 150–400)
RBC: 4.26 MIL/uL (ref 3.87–5.11)
RDW: 14.5 % (ref 11.5–15.5)
WBC: 10.8 10*3/uL — ABNORMAL HIGH (ref 4.0–10.5)

## 2017-03-27 MED ORDER — LEVOTHYROXINE SODIUM 25 MCG PO TABS
25.0000 ug | ORAL_TABLET | Freq: Every day | ORAL | Status: DC
  Administered 2017-03-28 – 2017-03-29 (×2): 25 ug via ORAL
  Filled 2017-03-27 (×2): qty 1

## 2017-03-27 MED ORDER — MONTELUKAST SODIUM 10 MG PO TABS
10.0000 mg | ORAL_TABLET | Freq: Every day | ORAL | Status: DC
  Administered 2017-03-28 – 2017-03-29 (×2): 10 mg via ORAL
  Filled 2017-03-27 (×2): qty 1

## 2017-03-27 MED ORDER — INSULIN ASPART 100 UNIT/ML ~~LOC~~ SOLN
0.0000 [IU] | Freq: Three times a day (TID) | SUBCUTANEOUS | Status: DC
Start: 2017-03-28 — End: 2017-03-29

## 2017-03-27 MED ORDER — LOSARTAN POTASSIUM 50 MG PO TABS
25.0000 mg | ORAL_TABLET | Freq: Every day | ORAL | Status: DC
  Administered 2017-03-28 – 2017-03-29 (×2): 25 mg via ORAL
  Filled 2017-03-27 (×2): qty 1

## 2017-03-27 MED ORDER — ONDANSETRON HCL 4 MG PO TABS: 4.0000 mg | ORAL_TABLET | Freq: Four times a day (QID) | ORAL | Status: DC | PRN

## 2017-03-27 MED ORDER — ONDANSETRON HCL 4 MG/2ML IJ SOLN: 4.0000 mg | Freq: Four times a day (QID) | INTRAMUSCULAR | Status: DC | PRN

## 2017-03-27 MED ORDER — PRAVASTATIN SODIUM 20 MG PO TABS
10.0000 mg | ORAL_TABLET | Freq: Every day | ORAL | Status: DC
  Administered 2017-03-28 (×2): 10 mg via ORAL
  Filled 2017-03-27 (×2): qty 1

## 2017-03-27 MED ORDER — ACETAMINOPHEN 650 MG RE SUPP: 650.0000 mg | Freq: Four times a day (QID) | RECTAL | Status: DC | PRN

## 2017-03-27 MED ORDER — ENOXAPARIN SODIUM 40 MG/0.4ML ~~LOC~~ SOLN
40.0000 mg | Freq: Every day | SUBCUTANEOUS | Status: DC
  Administered 2017-03-28 – 2017-03-29 (×2): 40 mg via SUBCUTANEOUS
  Filled 2017-03-27 (×2): qty 0.4

## 2017-03-27 MED ORDER — PROGESTERONE MICRONIZED 100 MG PO CAPS
200.0000 mg | ORAL_CAPSULE | Freq: Every day | ORAL | Status: DC
  Administered 2017-03-28 (×2): 200 mg via ORAL
  Filled 2017-03-27: qty 2
  Filled 2017-03-27: qty 1
  Filled 2017-03-27: qty 2

## 2017-03-27 MED ORDER — ACETAMINOPHEN 325 MG PO TABS
650.0000 mg | ORAL_TABLET | Freq: Four times a day (QID) | ORAL | Status: DC | PRN
  Administered 2017-03-28 – 2017-03-29 (×3): 650 mg via ORAL
  Filled 2017-03-27 (×3): qty 2

## 2017-03-27 MED ORDER — LIDOCAINE HCL 2 % IJ SOLN
20.0000 mL | Freq: Once | INTRAMUSCULAR | Status: AC
  Administered 2017-03-27: 400 mg
  Filled 2017-03-27: qty 20

## 2017-03-27 MED ORDER — VITAMIN B-12 1000 MCG PO TABS
1000.0000 ug | ORAL_TABLET | Freq: Every day | ORAL | Status: DC
  Administered 2017-03-28 – 2017-03-29 (×2): 1000 ug via ORAL
  Filled 2017-03-27 (×2): qty 1

## 2017-03-27 MED ORDER — SODIUM CHLORIDE 0.9 % IV SOLN
INTRAVENOUS | Status: DC
  Administered 2017-03-28: via INTRAVENOUS

## 2017-03-27 NOTE — ED Provider Notes (Signed)
[facility]ENC[facility] DEPARTMENT Provider Note   CSN: [id number] Arrival date & time: 03/27/17  1707     History   Chief Complaint Chief Complaint  Patient presents with  . Fall  Syncope  HPI Sherry [name] is a 83 y.o. female.  HPI  Patient presents after an episode of syncope and fall. Patient states that she was doing generally well, leaving her doctor's office, when she was slightly dizzy, and then awoke on the ground. She denies chest pain for, or after the episode. She has had a new pain in her right hip, and left face since the fall. She has not been ambulatory since the fall. Patient notes baseline chronic pain in both lower extremities from surgery several years ago. This is generally unchanged from the new right hip pain. No new loss of sensation or strength in the distal extremities. No confusion, no disorientation, no vision changes. There is associated pain in the posterior neck, head. No medication taken for pain relief until the patient was received by EMS, who provided narcotics, and the patient notes that she feels better.  Past Medical History:  Diagnosis Date  . Allergy   . Diabetes mellitus without complication ([place])   . Thyroid disease     Patient Active Problem List   Diagnosis Date Noted  . Adrenal nodule ([place]) 10/13/2016  . Health care maintenance 02/29/2016  . Primary osteoarthritis of both knees 05/07/2015  . Cervical radiculitis 11/03/2014  . DDD (degenerative disc disease), cervical 11/03/2014  . Lumbar stenosis with neurogenic claudication 11/03/2014  . DDD (degenerative disc disease), lumbar 07/14/2014  . Lumbar radiculitis 07/14/2014  . Trochanteric bursitis of right hip 07/14/2014  . Pure hypercholesterolemia 05/25/2014  . Multinodular non-toxic goiter 12/25/2013  . HTN (hypertension), benign 11/26/2013  . Diabetes mellitus with stage 3 chronic kidney disease ([place]) 09/14/2013  . OA (osteoarthritis) 09/14/2013      Past Surgical History:  Procedure Laterality Date  . CHOLECYSTECTOMY      OB History    No data available       Home Medications    Prior to Admission medications   Medication Sig Start Date End Date Taking? Authorizing Provider  ACCU-CHEK SMARTVIEW test strip 2 (two) times daily.  10/11/16   [provider]  Ascorbic Acid (VITAMIN C) 1000 MG tablet Take by mouth daily.     [provider]  estradiol (ESTRACE) 0.5 MG tablet TAKE 1 TABLET EVERY DAY 04/07/16   [provider]  furosemide (LASIX) 40 MG tablet Take 40 mg by mouth as needed for fluid or edema (for ankle swelling).    [provider]  levothyroxine (SYNTHROID, LEVOTHROID) 25 MCG tablet TAKE 1 TABLET EVERY DAY ON AN EMPTY STOMACH WITH A GLASS OF WATER AT LEAST 30 TO 60 MINUTES BEFORE BREAKFAST 09/07/16   [provider]  losartan (COZAAR) 25 MG tablet TAKE 1 TABLET EVERY DAY 09/14/16   [provider]  metFORMIN (GLUCOPHAGE) 500 MG tablet TAKE 1 TABLET TWICE DAILY WITH MEALS 05/01/16   [provider]  progesterone (PROMETRIUM) 200 MG capsule TAKE 1 CAPSULE EVERY NIGHT 09/14/16   [provider]  traMADol (ULTRAM) 50 MG tablet Take 50 mg by mouth every 12 (twelve) hours as needed.    [provider]  vitamin B-12 (CYANOCOBALAMIN) 1000 MCG tablet Take 1,000 mcg by mouth daily.    [provider]    Family History Family History  Problem Relation Age of Onset  .  Diabetes Mother   . Hypertension Mother   . Hypertension Father   . Cancer Brother     Social History Social History   Tobacco Use  . Smoking status: Current Every Day Smoker    Types: Cigarettes  Substance Use Topics  . Alcohol use: Not on file  . Drug use: Not on file     Allergies   Penicillin v potassium and Penicillins   Review of Systems Review of Systems  Constitutional:       Per HPI, otherwise negative  HENT:       Per HPI, otherwise negative   Respiratory:       Per HPI, otherwise negative  Cardiovascular:       Per HPI, otherwise negative  Gastrointestinal: Negative for vomiting.  Endocrine:       Negative aside from HPI  Genitourinary:       Neg aside from HPI   Musculoskeletal:       Per HPI, otherwise negative  Skin: Positive for wound.  Neurological: Positive for syncope.     Physical Exam Updated Vital Signs BP (!) 145/83 (BP Location: Left Arm)   Pulse 100   Temp 98.5 F (36.9 C) (Oral)   Resp 16   SpO2 97%   Physical Exam  Constitutional: She is oriented to person, place, and time. She appears well-developed and well-nourished. No distress. Cervical collar in place.  HENT:  Head: Normocephalic and atraumatic.    Eyes: Conjunctivae and EOM are normal.  Cardiovascular: Normal rate and regular rhythm.  Pulmonary/Chest: Effort normal and breath sounds normal. No stridor. No respiratory distress.  Abdominal: She exhibits no distension.  Musculoskeletal: She exhibits no edema.       Left hip: Normal.       Legs: Neurological: She is alert and oriented to person, place, and time. She displays no atrophy and no tremor. No cranial nerve deficit. She exhibits normal muscle tone. She displays no seizure activity. Coordination normal.  Skin: Skin is warm and dry.  Psychiatric: She has a normal mood and affect.  Nursing note and vitals reviewed.    ED Treatments / Results  Labs (all labs ordered are listed, but only abnormal results are displayed) Labs Reviewed  COMPREHENSIVE METABOLIC PANEL - Abnormal; Notable for the following components:      Result Value   Glucose, Bld 123 (*)    Creatinine, Ser 1.07 (*)    ALT 12 (*)    Total Bilirubin 1.3 (*)    GFR calc non Af Amer 48 (*)    GFR calc Af Amer 55 (*)    All other components within normal limits  CBC WITH DIFFERENTIAL/PLATELET - Abnormal; Notable for the following components:   WBC 10.8 (*)    Hemoglobin 11.2 (*)    HCT 35.9 (*)    Neutro Abs  8.3 (*)    All other components within normal limits    EKG  EKG Interpretation  Date/Time:  Tuesday March 27 2017 17:43:33 EST Ventricular Rate:  92 PR Interval:    QRS Duration: 126 QT Interval:  395 QTC Calculation: 489 R Axis:   -54 Text Interpretation:  Sinus rhythm RBBB and LAFB Left ventricular hypertrophy conduction delay is slightly more prominent compared to prior. Abnormal ekg Confirmed by Carmin Muskrat [phone]) on 03/27/2017 9:41:28 PM       Radiology Ct Head Wo Contrast  Result Date: 03/27/2017 CLINICAL DATA:  Dizziness and fall today. Laceration above the left eye. EXAM:  CT HEAD WITHOUT CONTRAST CT CERVICAL SPINE WITHOUT CONTRAST TECHNIQUE: Multidetector CT imaging of the head and cervical spine was performed following the standard protocol without intravenous contrast. Multiplanar CT image reconstructions of the cervical spine were also generated. COMPARISON:  Head CT 11/26/2013.  Brain MRI 06/09/2015. FINDINGS: CT HEAD FINDINGS Brain: Cortical atrophy and fairly extensive chronic microvascular ischemic change. No acute abnormality including hemorrhage, infarct, mass lesion, mass effect, midline shift or abnormal extra-axial fluid collection. No hydrocephalus or pneumocephalus. Vascular: Atherosclerosis noted. Skull: Intact. Sinuses/Orbits: Clear. Other: Skin defect consistent with laceration is seen above the left eye. No foreign body. CT CERVICAL SPINE FINDINGS Alignment: Maintained. Skull base and vertebrae: No acute fracture. No primary bone lesion or focal pathologic process. Bulky flowing ossification of the anterior longitudinal ligament at multiple levels consistent with DISH noted. Soft tissues and spinal canal: No prevertebral fluid or swelling. No visible canal hematoma. Disc levels: Marked loss of disc space height is present at all levels with the exception of C2-3. Multilevel facet arthropathy and uncovertebral degenerative change also noted. Upper chest: Negative.  Other: None. IMPRESSION: Laceration above the left eye. No other acute abnormality head or cervical spine. Atrophy and chronic microvascular ischemic change. Multilevel cervical spondylosis and DISH. Electronically Signed   By: Inge Rise M.D.   On: 03/27/2017 18:52   Ct Cervical Spine Wo Contrast  Result Date: 03/27/2017 CLINICAL DATA:  Dizziness and fall today. Laceration above the left eye. EXAM: CT HEAD WITHOUT CONTRAST CT CERVICAL SPINE WITHOUT CONTRAST TECHNIQUE: Multidetector CT imaging of the head and cervical spine was performed following the standard protocol without intravenous contrast. Multiplanar CT image reconstructions of the cervical spine were also generated. COMPARISON:  Head CT 11/26/2013.  Brain MRI 06/09/2015. FINDINGS: CT HEAD FINDINGS Brain: Cortical atrophy and fairly extensive chronic microvascular ischemic change. No acute abnormality including hemorrhage, infarct, mass lesion, mass effect, midline shift or abnormal extra-axial fluid collection. No hydrocephalus or pneumocephalus. Vascular: Atherosclerosis noted. Skull: Intact. Sinuses/Orbits: Clear. Other: Skin defect consistent with laceration is seen above the left eye. No foreign body. CT CERVICAL SPINE FINDINGS Alignment: Maintained. Skull base and vertebrae: No acute fracture. No primary bone lesion or focal pathologic process. Bulky flowing ossification of the anterior longitudinal ligament at multiple levels consistent with DISH noted. Soft tissues and spinal canal: No prevertebral fluid or swelling. No visible canal hematoma. Disc levels: Marked loss of disc space height is present at all levels with the exception of C2-3. Multilevel facet arthropathy and uncovertebral degenerative change also noted. Upper chest: Negative. Other: None. IMPRESSION: Laceration above the left eye. No other acute abnormality head or cervical spine. Atrophy and chronic microvascular ischemic change. Multilevel cervical spondylosis and DISH.  Electronically Signed   By: Inge Rise M.D.   On: 03/27/2017 18:52   Dg Hip Unilat With Pelvis 2-3 Views Right  Result Date: 03/27/2017 CLINICAL DATA:  Right lateral and posterior hip pain after fall. EXAM: DG HIP (WITH OR WITHOUT PELVIS) 2-3V RIGHT COMPARISON:  None. FINDINGS: Lumbosacral transitional vertebral anatomy with pseudoarticulation of L5 with S1 on the left. Right lateral osteophytes are seen of the included mid to lower lumbar spine. The bony pelvis appears intact. No diastasis. Mild joint space narrowing of both hips with acetabular spurring right greater than left. No acute fracture joint dislocations. No flattening of the weight-bearing portions of the femoral heads. IMPRESSION: 1. Lumbar spondylosis with lumbosacral transitional vertebral anatomy. 2. No acute pelvic fracture. 3. Osteoarthritic spurring and mild joint space narrowing  about both hips without acute fracture nor joint dislocations Electronically Signed   By: Ashley Royalty M.D.   On: 03/27/2017 18:25    Procedures Procedures (including critical care time)  Medications Ordered in ED Medications  lidocaine (XYLOCAINE) 2 % (with pres) injection 400 mg (400 mg Infiltration Given by Other 03/27/17 1839)     Initial Impression / Assessment and Plan / ED Course  I have reviewed the triage vital signs and the nursing notes.  Pertinent labs & imaging results that were available during my care of the patient were reviewed by me and considered in my medical decision making (see chart for details).  Chart review notable for MRI last month with demonstration of vertebral disease, as below IMPRESSION: 1. Minimal progression of spondylosis since previous study of 2016, especially at L1-2. 2. No acute findings or clear explanation for the patient's symptoms. There is no high-grade spinal stenosis or definite nerve root encroachment. The right lateral recess and right foramen are mildly narrowed at L1-2 and there is mild  central spinal stenosis at L2-3.  9:41 PM Patient awake and alert. On cardiac monitor she has no evidence for arrhythmia, consistent with EKG. I reviewed all findings with her again, and with concern for her episode of syncope, absent chest pain, there is some suspicion for cardiogenic versus neurogenic etiology. No evidence for neurologic dysfunction, and head CT is reassuring. Patient required admission for further evaluation and management.  Final Clinical Impressions(s) / ED Diagnoses   Syncope Facial laceration   Carmin Muskrat, MD 03/27/17 2142

## 2017-03-27 NOTE — ED Triage Notes (Signed)
Patient ambulates with the assistance of a walker.  She exited her opthomologists office and was putting her walker in the car and fell to the groung.  Patient does not recall the details of the event.  She C/O neck and head pain, Right hip and low back pain and pain in her left face.  There is a 5 cm full thickness  LAC over her left eye brow. And a tiny LAC on the bridge of her nose on the right.  Brusing noted over her left zygomatic arch.  Fentanyl 100 mg given by EMS

## 2017-03-27 NOTE — H&P (Signed)
History and Physical    Sherry [name] DGL:[id number] DOB: 01/23/1935 DOA: 03/27/2017  PCP: Kirk Ruths, MD  Patient coming from: Home.  Chief Complaint: Loss of consciousness.  HPI: Sherry [name] is a 83 y.o. female with history of chronic shortness of breath being followed by cardiologist at [facility], diabetes mellitus type 2, hypothyroidism hypertension chronic kidney disease and anemia had followed up with her ophthalmologist yesterday and was on the way back to her car when she started feeling dizzy and had a syncopal episode.  Patient fell onto her face.  Had a laceration on the left eyebrow area.  Patient denies any incontinence of urine or tongue bite.  Denies any chest pain palpitations nausea vomiting or diarrhea.  Has chronic exertional shortness of breath for which she is being followed up with cardiologist.  ED Course: In the ER patient had CT of the head and C-spine which did not show anything acute except for the laceration.  EKG shows normal sinus rhythm with RBBB and LAFB with QRS of 126 ms QTC of 489 ms.  Since patient also had low back pain x-ray of the pelvis was done which did not show anything acute.  Patient denies having taken any new medications.  Patient is being admitted for syncopal episode.  Patient had sutures done for the left eyebrow.  Review of Systems: As per HPI, rest all negative.   Past Medical History:  Diagnosis Date  . Allergy   . Diabetes mellitus without complication ([place])   . Thyroid disease     Past Surgical History:  Procedure Laterality Date  . CHOLECYSTECTOMY       reports that she has been smoking cigarettes.  she has never used smokeless tobacco. Her alcohol and drug histories are not on file.  Allergies  Allergen Reactions  . Penicillin V Potassium Other (See Comments)  . Penicillins Swelling and Rash    Has patient had a PCN reaction causing immediate rash, facial/tongue/throat swelling, SOB or lightheadedness with  hypotension: No Has patient had a PCN reaction causing severe rash involving mucus membranes or skin necrosis: No Has patient had a PCN reaction that required hospitalization: No Has patient had a PCN reaction occurring within the last 10 years: No If all of the above answers are "NO", then may proceed with Cephalosporin use.    Family History  Problem Relation Age of Onset  . Diabetes Mother   . Hypertension Mother   . Hypertension Father   . Cancer Brother     Prior to Admission medications   Medication Sig Start Date End Date Taking? Authorizing Provider  Ascorbic Acid (VITAMIN C) 1000 MG tablet Take by mouth daily.    Yes [provider]  Cholecalciferol (VITAMIN D3) 1000 units CAPS Take 1,000 Units by mouth daily.   Yes [provider]  estradiol (ESTRACE) 0.5 MG tablet TAKE 0.5 mg TABLET EVERY DAY 04/07/16  Yes [provider]  furosemide (LASIX) 40 MG tablet Take 40 mg by mouth as needed for fluid or edema (for ankle swelling).   Yes [provider]  levothyroxine (SYNTHROID, LEVOTHROID) 25 MCG tablet TAKE 25 mg TABLET EVERY DAY ON AN EMPTY STOMACH WITH A GLASS OF WATER AT LEAST 30 TO 60 MINUTES BEFORE BREAKFAST 09/07/16  Yes [provider]  losartan (COZAAR) 25 MG tablet TAKE 25 mg TABLET EVERY DAY 09/14/16  Yes [provider]  metFORMIN (GLUCOPHAGE) 500 MG tablet TAKE 500 mg TABLET TWICE DAILY WITH MEALS  05/01/16  Yes [provider]  montelukast (SINGULAIR) 10 MG tablet Take 10 mg by mouth daily. 11/14/16  Yes [provider]  pravastatin (PRAVACHOL) 10 MG tablet Take 10 mg by mouth at bedtime. 03/09/17  Yes [provider]  progesterone (PROMETRIUM) 200 MG capsule TAKE 200 mg CAPSULE EVERY NIGHT 09/14/16  Yes [provider]  traMADol (ULTRAM) 50 MG tablet Take 50 mg by mouth every 12 (twelve) hours as needed.   Yes [provider]  vitamin B-12 (CYANOCOBALAMIN) 1000 MCG tablet Take  1,000 mcg by mouth daily.   Yes [provider]    Physical Exam: Vitals:   03/27/17 2000 03/27/17 2015 03/27/17 2030 03/27/17 2045  BP: 119/76 (!) 147/69 131/66 127/63  Pulse: 85 79 76 76  Resp: (!) 21 16 13 17   Temp:      TempSrc:      SpO2: 99% 97% 99% 96%  Weight:      Height:          Constitutional: Moderately built and nourished. Vitals:   03/27/17 2000 03/27/17 2015 03/27/17 2030 03/27/17 2045  BP: 119/76 (!) 147/69 131/66 127/63  Pulse: 85 79 76 76  Resp: (!) 21 16 13 17   Temp:      TempSrc:      SpO2: 99% 97% 99% 96%  Weight:      Height:       Eyes: Left eyebrow area has been sutured. ENMT: No discharge from the ears eyes nose or mouth. Neck: No neck rigidity no mass felt.  Respiratory: No rhonchi or crepitations. Cardiovascular: S1-S2 heard no murmurs appreciated. Abdomen: Soft nontender bowel sounds present. Musculoskeletal: No edema.  No joint effusion. Skin: No rash.  Laceration on the left periorbital area. Neurologic: Alert awake oriented to time place and person.  Moves all extremities 5 x 5. Psychiatric: Appears normal.  Normal affect.   Labs on Admission: I have personally reviewed following labs and imaging studies  CBC: Recent Labs  Lab 03/27/17 1857  WBC 10.8*  NEUTROABS 8.3*  HGB 11.2*  HCT 35.9*  MCV 84.3  PLT 326   Basic Metabolic Panel: Recent Labs  Lab 03/27/17 1857  NA 138  K 4.0  CL 104  CO2 25  GLUCOSE 123*  BUN 19  CREATININE 1.07*  CALCIUM 10.1   GFR: Estimated Creatinine Clearance: 45.9 mL/min (A) (by C-G formula based on SCr of 1.07 mg/dL (H)). Liver Function Tests: Recent Labs  Lab 03/27/17 1857  AST 16  ALT 12*  ALKPHOS 53  BILITOT 1.3*  PROT 6.8  ALBUMIN 3.5   No results for input(s): LIPASE, AMYLASE in the last 168 hours. No results for input(s): AMMONIA in the last 168 hours. Coagulation Profile: No results for input(s): INR, PROTIME in the last 168 hours. Cardiac Enzymes: No results  for input(s): CKTOTAL, CKMB, CKMBINDEX, TROPONINI in the last 168 hours. BNP (last 3 results) No results for input(s): PROBNP in the last 8760 hours. HbA1C: No results for input(s): HGBA1C in the last 72 hours. CBG: No results for input(s): GLUCAP in the last 168 hours. Lipid Profile: No results for input(s): CHOL, HDL, LDLCALC, TRIG, CHOLHDL, LDLDIRECT in the last 72 hours. Thyroid Function Tests: No results for input(s): TSH, T4TOTAL, FREET4, T3FREE, THYROIDAB in the last 72 hours. Anemia Panel: No results for input(s): VITAMINB12, FOLATE, FERRITIN, TIBC, IRON, RETICCTPCT in the last 72 hours. Urine analysis:    Component Value Date/Time   COLORURINE Straw 06/19/2014 1924  APPEARANCEUR Hazy 06/19/2014 1924   LABSPEC 1.010 06/19/2014 1924   PHURINE 6.0 06/19/2014 1924   GLUCOSEU Negative 06/19/2014 1924   HGBUR Negative 06/19/2014 1924   BILIRUBINUR Negative 06/19/2014 1924   KETONESUR Negative 06/19/2014 1924   PROTEINUR Negative 06/19/2014 1924   NITRITE Negative 06/19/2014 1924   LEUKOCYTESUR 3+ 06/19/2014 1924   Sepsis Labs: @LABRCNTIP (procalcitonin:4,lacticidven:4) )No results found for this or any previous visit (from the past 240 hour(s)).   Radiological Exams on Admission: Ct Head Wo Contrast  Result Date: 03/27/2017 CLINICAL DATA:  Dizziness and fall today. Laceration above the left eye. EXAM: CT HEAD WITHOUT CONTRAST CT CERVICAL SPINE WITHOUT CONTRAST TECHNIQUE: Multidetector CT imaging of the head and cervical spine was performed following the standard protocol without intravenous contrast. Multiplanar CT image reconstructions of the cervical spine were also generated. COMPARISON:  Head CT 11/26/2013.  Brain MRI 06/09/2015. FINDINGS: CT HEAD FINDINGS Brain: Cortical atrophy and fairly extensive chronic microvascular ischemic change. No acute abnormality including hemorrhage, infarct, mass lesion, mass effect, midline shift or abnormal extra-axial fluid collection. No  hydrocephalus or pneumocephalus. Vascular: Atherosclerosis noted. Skull: Intact. Sinuses/Orbits: Clear. Other: Skin defect consistent with laceration is seen above the left eye. No foreign body. CT CERVICAL SPINE FINDINGS Alignment: Maintained. Skull base and vertebrae: No acute fracture. No primary bone lesion or focal pathologic process. Bulky flowing ossification of the anterior longitudinal ligament at multiple levels consistent with DISH noted. Soft tissues and spinal canal: No prevertebral fluid or swelling. No visible canal hematoma. Disc levels: Marked loss of disc space height is present at all levels with the exception of C2-3. Multilevel facet arthropathy and uncovertebral degenerative change also noted. Upper chest: Negative. Other: None. IMPRESSION: Laceration above the left eye. No other acute abnormality head or cervical spine. Atrophy and chronic microvascular ischemic change. Multilevel cervical spondylosis and DISH. Electronically Signed   By: Inge Rise M.D.   On: 03/27/2017 18:52   Ct Cervical Spine Wo Contrast  Result Date: 03/27/2017 CLINICAL DATA:  Dizziness and fall today. Laceration above the left eye. EXAM: CT HEAD WITHOUT CONTRAST CT CERVICAL SPINE WITHOUT CONTRAST TECHNIQUE: Multidetector CT imaging of the head and cervical spine was performed following the standard protocol without intravenous contrast. Multiplanar CT image reconstructions of the cervical spine were also generated. COMPARISON:  Head CT 11/26/2013.  Brain MRI 06/09/2015. FINDINGS: CT HEAD FINDINGS Brain: Cortical atrophy and fairly extensive chronic microvascular ischemic change. No acute abnormality including hemorrhage, infarct, mass lesion, mass effect, midline shift or abnormal extra-axial fluid collection. No hydrocephalus or pneumocephalus. Vascular: Atherosclerosis noted. Skull: Intact. Sinuses/Orbits: Clear. Other: Skin defect consistent with laceration is seen above the left eye. No foreign body. CT  CERVICAL SPINE FINDINGS Alignment: Maintained. Skull base and vertebrae: No acute fracture. No primary bone lesion or focal pathologic process. Bulky flowing ossification of the anterior longitudinal ligament at multiple levels consistent with DISH noted. Soft tissues and spinal canal: No prevertebral fluid or swelling. No visible canal hematoma. Disc levels: Marked loss of disc space height is present at all levels with the exception of C2-3. Multilevel facet arthropathy and uncovertebral degenerative change also noted. Upper chest: Negative. Other: None. IMPRESSION: Laceration above the left eye. No other acute abnormality head or cervical spine. Atrophy and chronic microvascular ischemic change. Multilevel cervical spondylosis and DISH. Electronically Signed   By: Inge Rise M.D.   On: 03/27/2017 18:52   Dg Hip Unilat With Pelvis 2-3 Views Right  Result Date: 03/27/2017 CLINICAL DATA:  Right  lateral and posterior hip pain after fall. EXAM: DG HIP (WITH OR WITHOUT PELVIS) 2-3V RIGHT COMPARISON:  None. FINDINGS: Lumbosacral transitional vertebral anatomy with pseudoarticulation of L5 with S1 on the left. Right lateral osteophytes are seen of the included mid to lower lumbar spine. The bony pelvis appears intact. No diastasis. Mild joint space narrowing of both hips with acetabular spurring right greater than left. No acute fracture joint dislocations. No flattening of the weight-bearing portions of the femoral heads. IMPRESSION: 1. Lumbar spondylosis with lumbosacral transitional vertebral anatomy. 2. No acute pelvic fracture. 3. Osteoarthritic spurring and mild joint space narrowing about both hips without acute fracture nor joint dislocations Electronically Signed   By: Ashley Royalty M.D.   On: 03/27/2017 18:25    EKG: Independently reviewed.  Normal sinus rhythm with RBBB and LAFB QRS of 126 ms and QTC of 489 ms.  Assessment/Plan Principal Problem:   Syncope Active Problems:   Diabetes mellitus  with stage 3 chronic kidney disease (HCC)   HTN (hypertension), benign   Hypothyroidism    1. Syncope -cause not clear.  since patient is on estrogen we will check d-dimer.  Check 2D echo.  Closely monitor in telemetry.  Patient did feel dizzy prior to the episode so we will check orthostatics.  Hold Lasix for now.  Patient has had a stress test and 2D echo done in [place] in May 2018 which showed EF of more than 55%.  As per the patient stress test was unremarkable. 2. Hypothyroidism on Synthroid.  Check TSH. 3. Diabetes mellitus type 2 -we will hold metformin while inpatient and keep patient on sliding scale coverage. 4. Chronic kidney disease stage II -creatinine appears to be at baseline. 5. Chronic anemia likely from chronic kidney disease -follow CBC. 6. Laceration on the left eyebrow area has been sutured will give 1 dose of tetanus toxoid since it is more than 10 years.   DVT prophylaxis: Lovenox. Code Status: Full code. Family Communication: Family at the bedside. Disposition Plan: Home. Consults called: Physical therapy. Admission status: Observation.   Rise Patience MD [facility] Pager [phone].  If 7PM-7AM, please contact night-coverage [email or web address] Password [facility]  03/27/2017, 9:55 PM

## 2017-03-27 NOTE — ED Provider Notes (Signed)
Patient of Dr. Darleene Cleaver in ER today. Laceration repaired at his request. Patient tolerated well with no complications. See note below.   Marland Kitchen.Laceration Repair Date/Time: 03/27/2017 7:07 PM Performed by: Ward, Ozella Almond, PA-C Authorized by: Ward, Ozella Almond, PA-C   Consent:    Consent obtained:  Verbal   Consent given by:  Patient   Risks discussed:  Infection, pain, poor cosmetic result and poor wound healing Anesthesia (see MAR for exact dosages):    Anesthesia method:  Local infiltration (33ml)   Local anesthetic:  Lidocaine 2% w/o epi Laceration details:    Location:  Face   Face location:  L eyebrow   Length (cm):  6 Repair type:    Repair type:  Simple Exploration:    Hemostasis achieved with:  Direct pressure   Wound exploration: entire depth of wound probed and visualized   Treatment:    Area cleansed with:  Saline   Amount of cleaning:  Standard   Irrigation solution:  Sterile saline Skin repair:    Repair method:  Sutures   Suture size:  4-0   Wound skin closure material used: VicrylRapide.   Number of sutures:  7 Approximation:    Approximation:  Close Post-procedure details:    Dressing:  Open (no dressing)   Patient tolerance of procedure:  Tolerated well, no immediate complications      Ward, Ozella Almond, PA-C 03/27/17 1911    Carmin Muskrat, MD 03/27/17 2143

## 2017-03-28 ENCOUNTER — Observation Stay (HOSPITAL_COMMUNITY): Payer: Medicare HMO

## 2017-03-28 ENCOUNTER — Observation Stay (HOSPITAL_BASED_OUTPATIENT_CLINIC_OR_DEPARTMENT_OTHER): Payer: Medicare HMO

## 2017-03-28 DIAGNOSIS — R55 Syncope and collapse: Secondary | ICD-10-CM | POA: Diagnosis not present

## 2017-03-28 DIAGNOSIS — I10 Essential (primary) hypertension: Secondary | ICD-10-CM

## 2017-03-28 DIAGNOSIS — E039 Hypothyroidism, unspecified: Secondary | ICD-10-CM | POA: Diagnosis not present

## 2017-03-28 LAB — BASIC METABOLIC PANEL
Anion gap: 8 (ref 5–15)
BUN: 18 mg/dL (ref 6–20)
CO2: 24 mmol/L (ref 22–32)
Calcium: 10 mg/dL (ref 8.9–10.3)
Chloride: 105 mmol/L (ref 101–111)
Creatinine, Ser: 1.01 mg/dL — ABNORMAL HIGH (ref 0.44–1.00)
GFR calc Af Amer: 59 mL/min — ABNORMAL LOW (ref >60)
GFR calc non Af Amer: 51 mL/min — ABNORMAL LOW (ref >60)
Glucose, Bld: 104 mg/dL — ABNORMAL HIGH (ref 65–99)
POTASSIUM: 3.8 mmol/L (ref 3.5–5.1)
SODIUM: 137 mmol/L (ref 135–145)

## 2017-03-28 LAB — URINALYSIS, ROUTINE W REFLEX MICROSCOPIC
BILIRUBIN URINE: NEGATIVE
Glucose, UA: NEGATIVE mg/dL
HGB URINE DIPSTICK: NEGATIVE
Ketones, ur: NEGATIVE mg/dL
Leukocytes, UA: NEGATIVE
Nitrite: NEGATIVE
Protein, ur: NEGATIVE mg/dL
Specific Gravity, Urine: 1.042 — ABNORMAL HIGH (ref 1.005–1.030)
pH: 6 (ref 5.0–8.0)

## 2017-03-28 LAB — ECHOCARDIOGRAM COMPLETE
Height: 66 in
Weight: 2976 oz

## 2017-03-28 LAB — CBC
HCT: 34.8 % — ABNORMAL LOW (ref 36.0–46.0)
Hemoglobin: 11.2 g/dL — ABNORMAL LOW (ref 12.0–15.0)
MCH: 26.8 pg (ref 26.0–34.0)
MCHC: 32.2 g/dL (ref 30.0–36.0)
MCV: 83.3 fL (ref 78.0–100.0)
Platelets: 297 10*3/uL (ref 150–400)
RBC: 4.18 MIL/uL (ref 3.87–5.11)
RDW: 14.7 % (ref 11.5–15.5)
WBC: 10.5 10*3/uL (ref 4.0–10.5)

## 2017-03-28 LAB — D-DIMER, QUANTITATIVE: D-Dimer, Quant: 1.12 ug/mL-FEU — ABNORMAL HIGH (ref 0.00–0.50)

## 2017-03-28 LAB — TSH: TSH: 2.254 u[IU]/mL (ref 0.350–4.500)

## 2017-03-28 LAB — CBG MONITORING, ED
GLUCOSE-CAPILLARY: 96 mg/dL (ref 65–99)
Glucose-Capillary: 158 mg/dL — ABNORMAL HIGH (ref 65–99)

## 2017-03-28 LAB — GLUCOSE, CAPILLARY
GLUCOSE-CAPILLARY: 185 mg/dL — AB (ref 65–99)
Glucose-Capillary: 118 mg/dL — ABNORMAL HIGH (ref 65–99)

## 2017-03-28 MED ORDER — IOPAMIDOL (ISOVUE-370) INJECTION 76%
INTRAVENOUS | Status: AC
  Administered 2017-03-28: 100 mL
  Filled 2017-03-28: qty 100

## 2017-03-28 MED ORDER — TETANUS-DIPHTH-ACELL PERTUSSIS 5-2.5-18.5 LF-MCG/0.5 IM SUSP
0.5000 mL | Freq: Once | INTRAMUSCULAR | Status: AC
  Administered 2017-03-28: 0.5 mL via INTRAMUSCULAR
  Filled 2017-03-28: qty 0.5

## 2017-03-28 MED ORDER — DICLOFENAC SODIUM 1 % TD GEL
2.0000 g | Freq: Four times a day (QID) | TRANSDERMAL | Status: DC
  Administered 2017-03-28 – 2017-03-29 (×3): 2 g via TOPICAL
  Filled 2017-03-28: qty 100

## 2017-03-28 MED ORDER — SODIUM CHLORIDE 0.9 % IV SOLN
INTRAVENOUS | Status: AC
  Administered 2017-03-28: 12:00:00 via INTRAVENOUS

## 2017-03-28 NOTE — Progress Notes (Signed)
  Echocardiogram 2D Echocardiogram has been performed.  Jennette Dubin 03/28/2017, 3:06 PM

## 2017-03-28 NOTE — ED Notes (Signed)
Attempted Report x1.   

## 2017-03-28 NOTE — Progress Notes (Signed)
PROGRESS NOTE    Sherry [name]  VHQ:[id number] DOB: Oct 19, 1934 DOA: 03/27/2017 PCP: Kirk Ruths, MD   Outpatient Specialists:    Brief Narrative:  Sherry [name] is a 83 y.o. female with history of chronic shortness of breath being followed by cardiologist at [facility], diabetes mellitus type 2, hypothyroidism hypertension chronic kidney disease and anemia had followed up with her ophthalmologist yesterday and was on the way back to her car when she started feeling dizzy and had a syncopal episode.  Patient fell onto her face.  Had a laceration on the left eyebrow area.  Patient denies any incontinence of urine or tongue bite.  Denies any chest pain palpitations nausea vomiting or diarrhea.  Has chronic exertional shortness of breath for which she is being followed up with cardiologist.     Assessment & Plan:   Principal Problem:   Syncope Active Problems:   Diabetes mellitus with stage 3 chronic kidney disease (HCC)   HTN (hypertension), benign   Hypothyroidism   Syncope -cause not clear -since patient is on estrogen, d-dimer was checked and slightly elevated-- CTA pending to r/o PE. -2D echo pending. -  Closely monitor in telemetry.  -  Hold Lasix for now and give gentle hydration for CTA--- orthostatics done in ER are negative - Patient has had a stress test and 2D echo done in [facility] in May 2018 which showed EF of more than 55%.  As per the patient stress test was unremarkable.  Hypothyroidism on Synthroid.  -has history of thyroid nodule and hyperthyroidism outpatient follow up -TSH normal  Left knee pain -dg knee done with results to osteoarthritis -will apply brace and voltaren gel -outpatient ortho referral  Diabetes mellitus type 2 -SSI  Chronic kidney disease stage II -creatinine appears to be at baseline.  Chronic anemia likely from chronic kidney disease -follow CBC.  Laceration on the left eyebrow area has been sutured will give 1 dose of tetanus  toxoid since it is more than 10 years.  Obesity Body mass index is 30.02 kg/m.     DVT prophylaxis:  Lovenox   Code Status: Full Code  Family Communication: At bedside  Disposition Plan:  Home 24 hours-- PT Eval pending   Consultants:       Subjective: C/o left knee pain when getting up  Objective: Vitals:   03/28/17 1130 03/28/17 1145 03/28/17 1200 03/28/17 1359  BP: (!) 118/52 (!) 133/50 122/62 (!) 142/54  Pulse: 79  86 81  Resp: 18   18  Temp:    98.2 F (36.8 C)  TempSrc:    Oral  SpO2: 96%  96% 100%  Weight:      Height:       No intake or output data in the 24 hours ending 03/28/17 1440 Filed Weights   03/27/17 1735  Weight: 84.4 kg (186 lb)    Examination:  General exam: Appears calm and comfortable  Respiratory system: Clear to auscultation. Respiratory effort normal. Cardiovascular system: S1 & S2 heard, RRR. No JVD, murmurs, rubs, gallops or clicks. No pedal edema. Gastrointestinal system: Abdomen is nondistended, soft and nontender. No organomegaly or masses felt. Normal bowel sounds heard. Central nervous system: Alert and oriented. No focal neurological deficits. Extremities: left knee tender to palpation-- no effusion Skin: No rashes, lesions or ulcers Psychiatry: Judgement and insight appear normal. Mood & affect appropriate.     Data Reviewed: I have personally reviewed following labs and imaging studies  CBC: Recent Labs  Lab 03/27/17 1857 03/28/17 0030  WBC 10.8* 10.5  NEUTROABS 8.3*  --   HGB 11.2* 11.2*  HCT 35.9* 34.8*  MCV 84.3 83.3  PLT 318 627   Basic Metabolic Panel: Recent Labs  Lab 03/27/17 1857 03/28/17 0030  NA 138 137  K 4.0 3.8  CL 104 105  CO2 25 24  GLUCOSE 123* 104*  BUN 19 18  CREATININE 1.07* 1.01*  CALCIUM 10.1 10.0   GFR: Estimated Creatinine Clearance: 48.6 mL/min (A) (by C-G formula based on SCr of 1.01 mg/dL (H)). Liver Function Tests: Recent Labs  Lab 03/27/17 1857  AST 16  ALT  12*  ALKPHOS 53  BILITOT 1.3*  PROT 6.8  ALBUMIN 3.5   No results for input(s): LIPASE, AMYLASE in the last 168 hours. No results for input(s): AMMONIA in the last 168 hours. Coagulation Profile: No results for input(s): INR, PROTIME in the last 168 hours. Cardiac Enzymes: No results for input(s): CKTOTAL, CKMB, CKMBINDEX, TROPONINI in the last 168 hours. BNP (last 3 results) No results for input(s): PROBNP in the last 8760 hours. HbA1C: No results for input(s): HGBA1C in the last 72 hours. CBG: Recent Labs  Lab 03/28/17 0030 03/28/17 0802  GLUCAP 96 158*   Lipid Profile: No results for input(s): CHOL, HDL, LDLCALC, TRIG, CHOLHDL, LDLDIRECT in the last 72 hours. Thyroid Function Tests: Recent Labs    03/28/17 0655  TSH 2.254   Anemia Panel: No results for input(s): VITAMINB12, FOLATE, FERRITIN, TIBC, IRON, RETICCTPCT in the last 72 hours. Urine analysis:    Component Value Date/Time   COLORURINE Straw 06/19/2014 1924   APPEARANCEUR Hazy 06/19/2014 1924   LABSPEC 1.010 06/19/2014 1924   PHURINE 6.0 06/19/2014 1924   GLUCOSEU Negative 06/19/2014 1924   HGBUR Negative 06/19/2014 1924   BILIRUBINUR Negative 06/19/2014 1924   KETONESUR Negative 06/19/2014 1924   PROTEINUR Negative 06/19/2014 1924   NITRITE Negative 06/19/2014 1924   LEUKOCYTESUR 3+ 06/19/2014 1924   Sepsis Labs: @LABRCNTIP (procalcitonin:4,lacticidven:4)  )No results found for this or any previous visit (from the past 240 hour(s)).    Anti-infectives (From admission, onward)   None       Radiology Studies: Dg Lumbar Spine 2-3 Views  Result Date: 03/28/2017 CLINICAL DATA:  Fall yesterday.  Low back pain. EXAM: LUMBAR SPINE - 2-3 VIEW COMPARISON:  Lumbar MRI, 03/05/2017 FINDINGS: No fracture.  No bone lesion. Slight, grade 1, anterolisthesis of L4 on L5. No other spondylolisthesis. Mild to moderate levoscoliosis, apex at L2-L3. Mild loss of disc height at L1-L2 through L4-L5. The L5-S1 disc space  is relatively well preserved. Small endplate osteophytes noted from L1 through L4. Bones are diffusely demineralized. There are scattered vascular calcifications along the abdominal aorta. IMPRESSION: 1. No fracture or acute finding. 2. Degenerative changes as detailed, without significant change when compared to the prior lumbar MRI Electronically Signed   By: Lajean Manes M.D.   On: 03/28/2017 09:14   Ct Head Wo Contrast  Result Date: 03/27/2017 CLINICAL DATA:  Dizziness and fall today. Laceration above the left eye. EXAM: CT HEAD WITHOUT CONTRAST CT CERVICAL SPINE WITHOUT CONTRAST TECHNIQUE: Multidetector CT imaging of the head and cervical spine was performed following the standard protocol without intravenous contrast. Multiplanar CT image reconstructions of the cervical spine were also generated. COMPARISON:  Head CT 11/26/2013.  Brain MRI 06/09/2015. FINDINGS: CT HEAD FINDINGS Brain: Cortical atrophy and fairly extensive chronic microvascular ischemic change. No acute abnormality including hemorrhage, infarct, mass lesion, mass effect, midline  shift or abnormal extra-axial fluid collection. No hydrocephalus or pneumocephalus. Vascular: Atherosclerosis noted. Skull: Intact. Sinuses/Orbits: Clear. Other: Skin defect consistent with laceration is seen above the left eye. No foreign body. CT CERVICAL SPINE FINDINGS Alignment: Maintained. Skull base and vertebrae: No acute fracture. No primary bone lesion or focal pathologic process. Bulky flowing ossification of the anterior longitudinal ligament at multiple levels consistent with DISH noted. Soft tissues and spinal canal: No prevertebral fluid or swelling. No visible canal hematoma. Disc levels: Marked loss of disc space height is present at all levels with the exception of C2-3. Multilevel facet arthropathy and uncovertebral degenerative change also noted. Upper chest: Negative. Other: None. IMPRESSION: Laceration above the left eye. No other acute  abnormality head or cervical spine. Atrophy and chronic microvascular ischemic change. Multilevel cervical spondylosis and DISH. Electronically Signed   By: Inge Rise M.D.   On: 03/27/2017 18:52   Ct Angio Chest Pe W Or Wo Contrast  Result Date: 03/28/2017 CLINICAL DATA:  Shortness of Breath EXAM: CT ANGIOGRAPHY CHEST WITH CONTRAST TECHNIQUE: Multidetector CT imaging of the chest was performed using the standard protocol during bolus administration of intravenous contrast. Multiplanar CT image reconstructions and MIPs were obtained to evaluate the vascular anatomy. CONTRAST:  140mL ISOVUE-370 IOPAMIDOL (ISOVUE-370) INJECTION 76% COMPARISON:  Chest CT February 27, 2013; chest radiograph November 26, 2013 FINDINGS: Cardiovascular: There is no demonstrable pulmonary embolus. There is no appreciable thoracic aortic aneurysm or dissection. The visualized great vessels appear unremarkable. There are foci of atherosclerotic calcification in the aorta. There are scattered foci of coronary artery calcification. There is no pericardial effusion or pericardial thickening. Mediastinum/Nodes: Previously noted diffuse thyroid enlargement has resolved. There does remain a focal mass arising from the right lobe of the thyroid measuring 2.3 x 1.7 cm. There is no appreciable thoracic adenopathy. There is a small hiatal hernia. Lungs/Pleura: There is mild atelectatic change in the lung bases. There is no edema or consolidation. No parenchymal lung nodular opacities are evident currently. No pleural effusion or pleural thickening evident. Upper Abdomen: There is atherosclerotic calcification in the upper abdominal aorta. Gallbladder is absent. There is an apparent cyst in the medial segment of the left lobe of the liver measuring 2.5 x 1.5 cm. There is a cyst in the right lobe of the liver medially measuring 1.4 x 1.0 cm. Visualized upper abdominal structures appear normal. Musculoskeletal: There is degenerative change in the  thoracic spine. There are no blastic or lytic bone lesions. Review of the MIP images confirms the above findings. IMPRESSION: 1.  No demonstrable pulmonary embolus. 2. **An incidental finding of potential clinical significance has been found. Mass arising from right lobe of thyroid measuring 2.3 x 1.7 cm. Consider further evaluation with thyroid ultrasound. If patient is clinically hyperthyroid, consider nuclear medicine thyroid uptake and scan.** 3.  No edema or consolidation. 4.  No adenopathy. 5.  Small hiatal hernia. 6. Aortic atherosclerosis. There are foci of coronary artery calcification. 7.  Gallbladder absent. Aortic Atherosclerosis (ICD10-I70.0). Electronically Signed   By: Lowella Grip III M.D.   On: 03/28/2017 13:27   Ct Cervical Spine Wo Contrast  Result Date: 03/27/2017 CLINICAL DATA:  Dizziness and fall today. Laceration above the left eye. EXAM: CT HEAD WITHOUT CONTRAST CT CERVICAL SPINE WITHOUT CONTRAST TECHNIQUE: Multidetector CT imaging of the head and cervical spine was performed following the standard protocol without intravenous contrast. Multiplanar CT image reconstructions of the cervical spine were also generated. COMPARISON:  Head CT 11/26/2013.  Brain  MRI 06/09/2015. FINDINGS: CT HEAD FINDINGS Brain: Cortical atrophy and fairly extensive chronic microvascular ischemic change. No acute abnormality including hemorrhage, infarct, mass lesion, mass effect, midline shift or abnormal extra-axial fluid collection. No hydrocephalus or pneumocephalus. Vascular: Atherosclerosis noted. Skull: Intact. Sinuses/Orbits: Clear. Other: Skin defect consistent with laceration is seen above the left eye. No foreign body. CT CERVICAL SPINE FINDINGS Alignment: Maintained. Skull base and vertebrae: No acute fracture. No primary bone lesion or focal pathologic process. Bulky flowing ossification of the anterior longitudinal ligament at multiple levels consistent with DISH noted. Soft tissues and spinal  canal: No prevertebral fluid or swelling. No visible canal hematoma. Disc levels: Marked loss of disc space height is present at all levels with the exception of C2-3. Multilevel facet arthropathy and uncovertebral degenerative change also noted. Upper chest: Negative. Other: None. IMPRESSION: Laceration above the left eye. No other acute abnormality head or cervical spine. Atrophy and chronic microvascular ischemic change. Multilevel cervical spondylosis and DISH. Electronically Signed   By: Inge Rise M.D.   On: 03/27/2017 18:52   Dg Knee Complete 4 Views Left  Result Date: 03/28/2017 CLINICAL DATA:  Knee pain post fall EXAM: LEFT KNEE - COMPLETE 4+ VIEW COMPARISON:  None FINDINGS: Osseous demineralization. Advanced tricompartmental osteoarthritic changes with joint space narrowing and spur formation. Chondrocalcinosis particularly lateral meniscus. No acute fracture, dislocation, or bone destruction. No knee joint effusion. IMPRESSION: Advanced osteoarthritic changes and question CPPD LEFT knee. No acute bony injuries identified. Electronically Signed   By: Lavonia Dana M.D.   On: 03/28/2017 13:18   Dg Hip Unilat With Pelvis 2-3 Views Right  Result Date: 03/27/2017 CLINICAL DATA:  Right lateral and posterior hip pain after fall. EXAM: DG HIP (WITH OR WITHOUT PELVIS) 2-3V RIGHT COMPARISON:  None. FINDINGS: Lumbosacral transitional vertebral anatomy with pseudoarticulation of L5 with S1 on the left. Right lateral osteophytes are seen of the included mid to lower lumbar spine. The bony pelvis appears intact. No diastasis. Mild joint space narrowing of both hips with acetabular spurring right greater than left. No acute fracture joint dislocations. No flattening of the weight-bearing portions of the femoral heads. IMPRESSION: 1. Lumbar spondylosis with lumbosacral transitional vertebral anatomy. 2. No acute pelvic fracture. 3. Osteoarthritic spurring and mild joint space narrowing about both hips without  acute fracture nor joint dislocations Electronically Signed   By: Ashley Royalty M.D.   On: 03/27/2017 18:25        Scheduled Meds: . diclofenac sodium  2 g Topical QID  . enoxaparin (LOVENOX) injection  40 mg Subcutaneous Daily  . insulin aspart  0-9 Units Subcutaneous TID WC  . levothyroxine  25 mcg Oral QAC breakfast  . losartan  25 mg Oral Daily  . montelukast  10 mg Oral Daily  . pravastatin  10 mg Oral QHS  . progesterone  200 mg Oral QHS  . vitamin B-12  1,000 mcg Oral Daily   Continuous Infusions: . sodium chloride 75 mL/hr at 03/28/17 1213     LOS: 0 days     Time spent: 35 min    Geradine Girt, DO [facility] Pager [phone]  If 7PM-7AM, please contact night-coverage [email or web address] Password [email or web address] 03/28/2017, 2:40 PM

## 2017-03-28 NOTE — Evaluation (Signed)
Physical Therapy Evaluation Patient Details Name: PAULLA MCCLASKEY MRN: [id number] DOB: 09/21/1934 Today's Date: 03/28/2017   History of Present Illness  83 yo female with syncopal episode was referred to PT after sustaining a fall and traumatizing L knee and sacral area.  New L eye laceration with sutures.  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  PMHx:  R BBB, anterolisthesis L4 on L5, CKD, DM, osteophytes in lumbar spine, L1-L5 disc height loss,   Clinical Impression  Pt was seen for evaluation of her mobility after sustaining a fall and coming to ED.  Her imaging has not given any new injuries but hopefully has increased her confidence to attempt mobility.  Will follow acutely for strengthening and balance and to continue to increase L knee ROM which with some work flexed to  90 deg.      Follow Up Recommendations Home health PT;Supervision/Assistance - 24 hour;Supervision for mobility/OOB    Equipment Recommendations  Rolling walker with 5" wheels    Recommendations for Other Services Rehab consult     Precautions / Restrictions Precautions Precautions: Fall(levoscoliosis lumbar spine with L2-3 apex) Precaution Comments: used SPC and rollator previous to trip to hosp Restrictions Weight Bearing Restrictions: No      Mobility  Bed Mobility Overal bed mobility: Modified Independent             General bed mobility comments: Pt is getting herself into and out of bed  Transfers Overall transfer level: Needs assistance Equipment used: Rolling walker (2 wheeled);1 person hand held assist Transfers: Sit to/from Stand Sit to Stand: Min assist;Mod assist         General transfer comment: dense cues for hand placement to stand  Ambulation/Gait Ambulation/Gait assistance: Min guard Ambulation Distance (Feet): 35 Feet Assistive device: Rolling walker (2 wheeled);1 person hand held assist Gait  Pattern/deviations: Step-through pattern;Wide base of support;Shuffle;Decreased stride length;Decreased stance time - left Gait velocity: reduced Gait velocity interpretation: Below normal speed for age/gender General Gait Details: Pt is demonstrating willingness to be seen to walk and do ROM to L knee  Stairs Stairs: (deferred)          Wheelchair Mobility    Modified Rankin (Stroke Patients Only)       Balance Overall balance assessment: Needs assistance Sitting-balance support: Bilateral upper extremity supported;Feet supported Sitting balance-Leahy Scale: Good     Standing balance support: Bilateral upper extremity supported;During functional activity Standing balance-Leahy Scale: Fair Standing balance comment: less than fair dynamically                             Pertinent Vitals/Pain Pain Assessment: Faces Faces Pain Scale: Hurts even more Pain Location: L knee when bending backward Pain Descriptors / Indicators: Sharp Pain Intervention(s): Limited activity within patient's tolerance;Monitored during session;Premedicated before session;Repositioned    Home Living Family/patient expects to be discharged to:: Private residence Living Arrangements: Alone Available Help at Discharge: Family;Friend(s);Available PRN/intermittently Type of Home: House Home Access: Stairs to enter Entrance Stairs-Rails: Right;Left;Can reach both Entrance Stairs-Number of Steps: 3 Home Layout: Multi-level Home Equipment: Walker - 4 wheels;Cane - single point Additional Comments: Has sustained a fall into the L eye on a car and trip onto L knee, no new injuries or fractures noted    Prior Function Level of Independence: Independent with assistive device(s)         Comments: Pt  has been in a staired home prior to trip to [place]        Extremity/Trunk Assessment   Upper Extremity Assessment Upper Extremity Assessment: Overall WFL for tasks  assessed    Lower Extremity Assessment Lower Extremity Assessment: Overall WFL for tasks assessed    Cervical / Trunk Assessment Cervical / Trunk Assessment: Kyphotic  Communication   Communication: No difficulties  Cognition Arousal/Alertness: Awake/alert Behavior During Therapy: WFL for tasks assessed/performed Overall Cognitive Status: Within Functional Limits for tasks assessed                                 General Comments: had good tolerance for gait with RW bedside      General Comments General comments (skin integrity, edema, etc.): Has edema on L ankle and foot, progressing from pt's tolerance for ROM     Exercises     Assessment/Plan    PT Assessment Patient needs continued PT services  PT Problem List Decreased strength;Decreased range of motion;Decreased activity tolerance;Decreased balance;Decreased mobility;Decreased coordination;Decreased knowledge of use of DME;Decreased safety awareness;Decreased knowledge of precautions;Decreased skin integrity;Pain;Cardiopulmonary status limiting activity       PT Treatment Interventions DME instruction;Gait training;Stair training;Functional mobility training;Therapeutic activities;Therapeutic exercise;Balance training;Cognitive remediation;Neuromuscular re-education;Patient/family education    PT Goals (Current goals can be found in the Care Plan section)  Acute Rehab PT Goals Patient Stated Goal: to get her knee pain to go away. PT Goal Formulation: With patient Time For Goal Achievement: 04/11/17 Potential to Achieve Goals: Good    Frequency Min 4X/week   Barriers to discharge Inaccessible home environment;Decreased caregiver support home alone and has stairs at every transition in her home    Co-evaluation               AM-PAC PT "6 Clicks" Daily Activity  Outcome Measure Difficulty turning over in bed (including adjusting bedclothes, sheets and blankets)?: Unable Difficulty moving from  lying on back to sitting on the side of the bed? : Unable Difficulty sitting down on and standing up from a chair with arms (e.g., wheelchair, bedside commode, etc,.)?: Unable Help needed moving to and from a bed to chair (including a wheelchair)?: A Little Help needed walking in hospital room?: A Lot Help needed climbing 3-5 steps with a railing? : Total 6 Click Score: 9    End of Session Equipment Utilized During Treatment: Gait belt Activity Tolerance: Patient tolerated treatment well;Patient limited by fatigue Patient left: in bed;with call bell/phone within reach;with bed alarm set;with family/visitor present Nurse Communication: Mobility status;Other (comment)(discharge plan) PT Visit Diagnosis: Unsteadiness on feet (R26.81);Other abnormalities of gait and mobility (R26.89);Muscle weakness (generalized) (M62.81);History of falling (Z91.81);Difficulty in walking, not elsewhere classified (R26.2);Pain Pain - Right/Left: Left Pain - part of body: Knee    Time: 4970-2637 PT Time Calculation (min) (ACUTE ONLY): 29 min   Charges:   PT Evaluation $PT Eval Moderate Complexity: 1 Mod PT Treatments $Gait Training: 8-22 mins   PT G Codes:   PT G-Codes **NOT FOR INPATIENT CLASS** Functional Assessment Tool Used: AM-PAC 6 Clicks Basic Mobility    Ramond Dial 03/28/2017, 5:23 PM   Mee Hives, PT MS Acute Rehab Dept. Number: [place] and [place]

## 2017-03-28 NOTE — ED Notes (Signed)
Pt taken to Xray.

## 2017-03-29 DIAGNOSIS — N183 Chronic kidney disease, stage 3 (moderate): Secondary | ICD-10-CM | POA: Diagnosis not present

## 2017-03-29 DIAGNOSIS — E039 Hypothyroidism, unspecified: Secondary | ICD-10-CM | POA: Diagnosis not present

## 2017-03-29 DIAGNOSIS — E1122 Type 2 diabetes mellitus with diabetic chronic kidney disease: Secondary | ICD-10-CM

## 2017-03-29 DIAGNOSIS — R55 Syncope and collapse: Secondary | ICD-10-CM | POA: Diagnosis not present

## 2017-03-29 DIAGNOSIS — I10 Essential (primary) hypertension: Secondary | ICD-10-CM | POA: Diagnosis not present

## 2017-03-29 LAB — BASIC METABOLIC PANEL
Anion gap: 7 (ref 5–15)
BUN: 12 mg/dL (ref 6–20)
CHLORIDE: 107 mmol/L (ref 101–111)
CO2: 24 mmol/L (ref 22–32)
Calcium: 9.3 mg/dL (ref 8.9–10.3)
Creatinine, Ser: 1.04 mg/dL — ABNORMAL HIGH (ref 0.44–1.00)
GFR calc Af Amer: 57 mL/min — ABNORMAL LOW (ref >60)
GFR calc non Af Amer: 49 mL/min — ABNORMAL LOW (ref >60)
GLUCOSE: 147 mg/dL — AB (ref 65–99)
POTASSIUM: 3.7 mmol/L (ref 3.5–5.1)
SODIUM: 138 mmol/L (ref 135–145)

## 2017-03-29 LAB — CBC
HEMATOCRIT: 31.1 % — AB (ref 36.0–46.0)
HEMOGLOBIN: 9.7 g/dL — AB (ref 12.0–15.0)
MCH: 26.3 pg (ref 26.0–34.0)
MCHC: 31.2 g/dL (ref 30.0–36.0)
MCV: 84.3 fL (ref 78.0–100.0)
Platelets: 280 10*3/uL (ref 150–400)
RBC: 3.69 MIL/uL — AB (ref 3.87–5.11)
RDW: 14.6 % (ref 11.5–15.5)
WBC: 7.8 10*3/uL (ref 4.0–10.5)

## 2017-03-29 LAB — GLUCOSE, CAPILLARY
GLUCOSE-CAPILLARY: 147 mg/dL — AB (ref 65–99)
GLUCOSE-CAPILLARY: 132 mg/dL — AB (ref 65–99)
GLUCOSE-CAPILLARY: 131 mg/dL — AB (ref 65–99)

## 2017-03-29 MED ORDER — METFORMIN HCL 500 MG PO TABS: ORAL_TABLET | ORAL | Status: AC

## 2017-03-29 MED ORDER — HYDRALAZINE HCL 20 MG/ML IJ SOLN: 5.0000 mg | Freq: Once | INTRAMUSCULAR | Status: DC

## 2017-03-29 MED ORDER — LEVOTHYROXINE SODIUM 25 MCG PO TABS: ORAL_TABLET | ORAL | Status: AC

## 2017-03-29 NOTE — Discharge Instructions (Signed)

## 2017-03-29 NOTE — Care Management Note (Addendum)
Case Management Note  Patient Details  Name: Sherry [name] MRN: [id number] Date of Birth: [birth year]  Subjective/Objective:     Pt admitted with syncope. She is from home alone.               Action/Plan: Pt recommending HH with 24/7 supervision. CM informed the patient and her sister at the bedside. Sister is offering for the patient to stay at her home but patient is refusing. She wants the sister to stay at her home. Patient then called another sister while CM in the room and left her a message about needing 24/7 supervision at d/c.  CM inquired about rehab and at first she said she may then she said she is not going anywhere but home. MD updated.  CM provided her and her sister the [facility] list and they selected [facility]. CM called Sharmon Revere with [facility] and she accepted the referral.  Pt with orders for walker. Butch Penny with [facility] notified and she will deliver the equipment to the room.  Sister is able to provide transportation home for the patient.  Expected Discharge Date:                  Expected Discharge Plan:  [place]  In-House Referral:     Discharge planning Services  CM Consult  Post Acute Care Choice:  Home Health, Durable Medical Equipment Choice offered to:  Patient, Sibling  DME Arranged:  Walker rolling DME Agency:  [place] Arranged:  PT, Nurse's Aide [place] Agency:  [place]  Status of Service:  Completed, signed off  If discussed at [place] of Stay Meetings, dates discussed:    Additional Comments:  Pollie Friar, RN 03/29/2017, 1:28 PM

## 2017-03-29 NOTE — Plan of Care (Signed)
  Clinical Measurements: Cardiovascular complication will be avoided 03/29/2017 0553 - Progressing by Irish Lack, RN   Clinical Measurements: Ability to maintain clinical measurements within normal limits will improve 03/29/2017 0553 - Progressing by Irish Lack, RN   Pain Managment: General experience of comfort will improve 03/29/2017 0553 - Progressing by Irish Lack, RN   Safety: Ability to remain free from injury will improve 03/29/2017 0553 - Progressing by Irish Lack, RN

## 2017-03-29 NOTE — Discharge Summary (Signed)
Physician Discharge Summary  Sherry [name] JSH:[id number] DOB: 1934/04/30  PCP: Kirk Ruths, MD  Admit date: 03/27/2017 Discharge date: 03/29/2017  Recommendations for Outpatient Follow-up:  1. Dr. Frazier Richards, PCP in 5 days with repeat labs (CBC & BMP).  Home Health: PT and Aide Equipment/Devices: Rolling walker with 5 inch wheels.    Discharge Condition: Improved and stable.  CODE STATUS: Full.  Diet recommendation: Heart healthy & diabetic diet.  Discharge Diagnoses:  Principal Problem:   Syncope Active Problems:   Diabetes mellitus with stage 3 chronic kidney disease (HCC)   HTN (hypertension), benign   Hypothyroidism   Brief Summary: 83 year old female, lives alone, PMH of chronic dyspnea followed by cardiologist at [facility], DM 2, hypothyroid, HTN, chronic kidney disease, anemia, status post cataract surgery couple months ago, usually does not drive but drove to see her ophthalmologist on day of admission, on walking back from the office to her car after her visit, she felt dizzy, lightheaded and passed out. She hit her face to nearby car. She denied urinary incontinence or tongue bite. She denied chest pain, palpitations, asymmetric limb weakness, tingling or numbness, slurred speech or facial asymmetry. She gives history of chronic intermittent dizziness and lightheadedness 4 years after some surgeries. She had checked her blood sugar that morning and it was in the 120s. She had a banana for breakfast and nothing else through the event. She was admitted for further evaluation and management.  Assessment and plan:  1. Syncope: Unclear etiology. CT of the head and neck without acute findings except for laceration. EKG showed normal sinus rhythm, RBBB, LAFB and QTC 489 ms. Since patient was on estrogen and progesterone, d-dimer was checked which was positive. This was followed up with CTA chest which was negative for PE. 2-D echo showed normal EF. Telemetry showed  sinus rhythm without arrhythmias. No carotid bruit on exam. No orthostatic blood pressure changes. Patient apparently had a stress test and 2-D echo at [facility] in May 2018 which were unremarkable. PT evaluated and recommended home health PT and 24-hour supervision which patient assures she will arrange. Outpatient close follow-up with her PCP. Patient was counseled extensively that she should not drive at least for 6 months and she verbalized understanding. 2. Right thyroid mass: Noted on CT chest. Recommend outpatient evaluation. TSH normal. Clinically euthyroid.  3. Hypothyroid: Continue Synthroid. 4. Type II DM: Reasonable inpatient control on SSI. Hold metformin for 48 hours post CT contrast and then resume. 5. Stage II chronic kidney disease: Creatinine normal and stable. 6. Chronic anemia: Possibly due to chronic kidney disease versus other etiologies. Follow CBCs as outpatient. 7. Laceration over left eyebrow: Sutured in ED. Received updated tetanus shot. 8. Left knee pain: Secondary to osteoarthritis. No acute findings on physical exam or x-ray. 9. Obesity/Body mass index is 30.02 kg/m.    Consultations:  None  Procedures:  TTE 03/28/17: LVEF 60-65 percent.   Discharge Instructions  Discharge Instructions    Call MD for:  extreme fatigue   Complete by:  As directed    Call MD for:  persistant dizziness or light-headedness   Complete by:  As directed    Call MD for:  severe uncontrolled pain   Complete by:  As directed    Diet - low sodium heart healthy   Complete by:  As directed    Diet Carb Modified   Complete by:  As directed    Driving Restrictions   Complete by:  As directed  No driving for 6 months or until advised to do so by your outpatient physician during follow-up.   Increase activity slowly   Complete by:  As directed        Medication List    TAKE these medications   estradiol 0.5 MG tablet Commonly known as:  ESTRACE TAKE 0.5 mg TABLET EVERY DAY    furosemide 40 MG tablet Commonly known as:  LASIX Take 40 mg by mouth as needed for fluid or edema (for ankle swelling).   levothyroxine 25 MCG tablet Commonly known as:  SYNTHROID, LEVOTHROID TAKE 25 mcg TABLET EVERY DAY ON AN EMPTY STOMACH WITH A GLASS OF WATER AT LEAST 30 TO 60 MINUTES BEFORE BREAKFAST What changed:  See the new instructions.   losartan 25 MG tablet Commonly known as:  COZAAR TAKE 25 mg TABLET EVERY DAY   metFORMIN 500 MG tablet Commonly known as:  GLUCOPHAGE TAKE 500 mg TABLET TWICE DAILY WITH MEALS  Do not take on 1/10 or 03/30/17 due to contrast given for CT scan. Restart taking on 03/31/17. What changed:  See the new instructions.   montelukast 10 MG tablet Commonly known as:  SINGULAIR Take 10 mg by mouth daily.   pravastatin 10 MG tablet Commonly known as:  PRAVACHOL Take 10 mg by mouth at bedtime.   progesterone 200 MG capsule Commonly known as:  PROMETRIUM TAKE 200 mg CAPSULE EVERY NIGHT   traMADol 50 MG tablet Commonly known as:  ULTRAM Take 50 mg by mouth every 12 (twelve) hours as needed.   vitamin B-12 1000 MCG tablet Commonly known as:  CYANOCOBALAMIN Take 1,000 mcg by mouth daily.   vitamin C 1000 MG tablet Take by mouth daily.   Vitamin D3 1000 units Caps Take 1,000 Units by mouth daily.      Follow-up Information    Kirk Ruths, MD. Schedule an appointment as soon as possible for a visit in 5 day(s).   Specialty:  Internal Medicine Why:  To be seen with repeat labs (CBC & BMP). Contact information: [place] [address] [phone]          Allergies  Allergen Reactions  . Penicillin V Potassium Other (See Comments)  . Penicillins Swelling and Rash    Has patient had a PCN reaction causing immediate rash, facial/tongue/throat swelling, SOB or lightheadedness with hypotension: No Has patient had a PCN reaction causing severe rash involving mucus membranes or skin necrosis: No Has  patient had a PCN reaction that required hospitalization: No Has patient had a PCN reaction occurring within the last 10 years: No If all of the above answers are "NO", then may proceed with Cephalosporin use.      Procedures/Studies: Dg Lumbar Spine 2-3 Views  Result Date: 03/28/2017 CLINICAL DATA:  Fall yesterday.  Low back pain. EXAM: LUMBAR SPINE - 2-3 VIEW COMPARISON:  Lumbar MRI, 03/05/2017 FINDINGS: No fracture.  No bone lesion. Slight, grade 1, anterolisthesis of L4 on L5. No other spondylolisthesis. Mild to moderate levoscoliosis, apex at L2-L3. Mild loss of disc height at L1-L2 through L4-L5. The L5-S1 disc space is relatively well preserved. Small endplate osteophytes noted from L1 through L4. Bones are diffusely demineralized. There are scattered vascular calcifications along the abdominal aorta. IMPRESSION: 1. No fracture or acute finding. 2. Degenerative changes as detailed, without significant change when compared to the prior lumbar MRI Electronically Signed   By: Lajean Manes M.D.   On: 03/28/2017 09:14   Ct Head  Wo Contrast  Result Date: 03/27/2017 CLINICAL DATA:  Dizziness and fall today. Laceration above the left eye. EXAM: CT HEAD WITHOUT CONTRAST CT CERVICAL SPINE WITHOUT CONTRAST TECHNIQUE: Multidetector CT imaging of the head and cervical spine was performed following the standard protocol without intravenous contrast. Multiplanar CT image reconstructions of the cervical spine were also generated. COMPARISON:  Head CT 11/26/2013.  Brain MRI 06/09/2015. FINDINGS: CT HEAD FINDINGS Brain: Cortical atrophy and fairly extensive chronic microvascular ischemic change. No acute abnormality including hemorrhage, infarct, mass lesion, mass effect, midline shift or abnormal extra-axial fluid collection. No hydrocephalus or pneumocephalus. Vascular: Atherosclerosis noted. Skull: Intact. Sinuses/Orbits: Clear. Other: Skin defect consistent with laceration is seen above the left eye. No  foreign body. CT CERVICAL SPINE FINDINGS Alignment: Maintained. Skull base and vertebrae: No acute fracture. No primary bone lesion or focal pathologic process. Bulky flowing ossification of the anterior longitudinal ligament at multiple levels consistent with DISH noted. Soft tissues and spinal canal: No prevertebral fluid or swelling. No visible canal hematoma. Disc levels: Marked loss of disc space height is present at all levels with the exception of C2-3. Multilevel facet arthropathy and uncovertebral degenerative change also noted. Upper chest: Negative. Other: None. IMPRESSION: Laceration above the left eye. No other acute abnormality head or cervical spine. Atrophy and chronic microvascular ischemic change. Multilevel cervical spondylosis and DISH. Electronically Signed   By: Inge Rise M.D.   On: 03/27/2017 18:52   Ct Angio Chest Pe W Or Wo Contrast  Result Date: 03/28/2017 CLINICAL DATA:  Shortness of Breath EXAM: CT ANGIOGRAPHY CHEST WITH CONTRAST TECHNIQUE: Multidetector CT imaging of the chest was performed using the standard protocol during bolus administration of intravenous contrast. Multiplanar CT image reconstructions and MIPs were obtained to evaluate the vascular anatomy. CONTRAST:  174mL ISOVUE-370 IOPAMIDOL (ISOVUE-370) INJECTION 76% COMPARISON:  Chest CT February 27, 2013; chest radiograph November 26, 2013 FINDINGS: Cardiovascular: There is no demonstrable pulmonary embolus. There is no appreciable thoracic aortic aneurysm or dissection. The visualized great vessels appear unremarkable. There are foci of atherosclerotic calcification in the aorta. There are scattered foci of coronary artery calcification. There is no pericardial effusion or pericardial thickening. Mediastinum/Nodes: Previously noted diffuse thyroid enlargement has resolved. There does remain a focal mass arising from the right lobe of the thyroid measuring 2.3 x 1.7 cm. There is no appreciable thoracic adenopathy.  There is a small hiatal hernia. Lungs/Pleura: There is mild atelectatic change in the lung bases. There is no edema or consolidation. No parenchymal lung nodular opacities are evident currently. No pleural effusion or pleural thickening evident. Upper Abdomen: There is atherosclerotic calcification in the upper abdominal aorta. Gallbladder is absent. There is an apparent cyst in the medial segment of the left lobe of the liver measuring 2.5 x 1.5 cm. There is a cyst in the right lobe of the liver medially measuring 1.4 x 1.0 cm. Visualized upper abdominal structures appear normal. Musculoskeletal: There is degenerative change in the thoracic spine. There are no blastic or lytic bone lesions. Review of the MIP images confirms the above findings. IMPRESSION: 1.  No demonstrable pulmonary embolus. 2. **An incidental finding of potential clinical significance has been found. Mass arising from right lobe of thyroid measuring 2.3 x 1.7 cm. Consider further evaluation with thyroid ultrasound. If patient is clinically hyperthyroid, consider nuclear medicine thyroid uptake and scan.** 3.  No edema or consolidation. 4.  No adenopathy. 5.  Small hiatal hernia. 6. Aortic atherosclerosis. There are foci of coronary artery calcification.  7.  Gallbladder absent. Aortic Atherosclerosis (ICD10-I70.0). Electronically Signed   By: Lowella Grip III M.D.   On: 03/28/2017 13:27   Ct Cervical Spine Wo Contrast  Result Date: 03/27/2017 CLINICAL DATA:  Dizziness and fall today. Laceration above the left eye. EXAM: CT HEAD WITHOUT CONTRAST CT CERVICAL SPINE WITHOUT CONTRAST TECHNIQUE: Multidetector CT imaging of the head and cervical spine was performed following the standard protocol without intravenous contrast. Multiplanar CT image reconstructions of the cervical spine were also generated. COMPARISON:  Head CT 11/26/2013.  Brain MRI 06/09/2015. FINDINGS: CT HEAD FINDINGS Brain: Cortical atrophy and fairly extensive chronic  microvascular ischemic change. No acute abnormality including hemorrhage, infarct, mass lesion, mass effect, midline shift or abnormal extra-axial fluid collection. No hydrocephalus or pneumocephalus. Vascular: Atherosclerosis noted. Skull: Intact. Sinuses/Orbits: Clear. Other: Skin defect consistent with laceration is seen above the left eye. No foreign body. CT CERVICAL SPINE FINDINGS Alignment: Maintained. Skull base and vertebrae: No acute fracture. No primary bone lesion or focal pathologic process. Bulky flowing ossification of the anterior longitudinal ligament at multiple levels consistent with DISH noted. Soft tissues and spinal canal: No prevertebral fluid or swelling. No visible canal hematoma. Disc levels: Marked loss of disc space height is present at all levels with the exception of C2-3. Multilevel facet arthropathy and uncovertebral degenerative change also noted. Upper chest: Negative. Other: None. IMPRESSION: Laceration above the left eye. No other acute abnormality head or cervical spine. Atrophy and chronic microvascular ischemic change. Multilevel cervical spondylosis and DISH. Electronically Signed   By: Inge Rise M.D.   On: 03/27/2017 18:52   Dg Knee Complete 4 Views Left  Result Date: 03/28/2017 CLINICAL DATA:  Knee pain post fall EXAM: LEFT KNEE - COMPLETE 4+ VIEW COMPARISON:  None FINDINGS: Osseous demineralization. Advanced tricompartmental osteoarthritic changes with joint space narrowing and spur formation. Chondrocalcinosis particularly lateral meniscus. No acute fracture, dislocation, or bone destruction. No knee joint effusion. IMPRESSION: Advanced osteoarthritic changes and question CPPD LEFT knee. No acute bony injuries identified. Electronically Signed   By: Lavonia Dana M.D.   On: 03/28/2017 13:18   Dg Hip Unilat With Pelvis 2-3 Views Right  Result Date: 03/27/2017 CLINICAL DATA:  Right lateral and posterior hip pain after fall. EXAM: DG HIP (WITH OR WITHOUT PELVIS)  2-3V RIGHT COMPARISON:  None. FINDINGS: Lumbosacral transitional vertebral anatomy with pseudoarticulation of L5 with S1 on the left. Right lateral osteophytes are seen of the included mid to lower lumbar spine. The bony pelvis appears intact. No diastasis. Mild joint space narrowing of both hips with acetabular spurring right greater than left. No acute fracture joint dislocations. No flattening of the weight-bearing portions of the femoral heads. IMPRESSION: 1. Lumbar spondylosis with lumbosacral transitional vertebral anatomy. 2. No acute pelvic fracture. 3. Osteoarthritic spurring and mild joint space narrowing about both hips without acute fracture nor joint dislocations Electronically Signed   By: Ashley Royalty M.D.   On: 03/27/2017 18:25      Subjective: States that she feels much better. Reports chronic intermittent dizziness and lightheadedness ongoing for several years which she feels were secondary to prior surgeries. No headache, neck pain, chest pain, palpitations, dyspnea at this time. Left knee pain improved.  Discharge Exam:  Vitals:   03/29/17 0040 03/29/17 0425 03/29/17 0948 03/29/17 1428  BP: (!) 119/55 120/60 134/64 133/64  Pulse: 81 80 72 85  Resp: 18 18 (!) 22 (!) 22  Temp: 98.2 F (36.8 C) 98.1 F (36.7 C) 98.1 F (36.7 C)  98.2 F (36.8 C)  TempSrc: Oral Oral Oral Oral  SpO2: 98% 99% 99%   Weight:      Height:        General: Elderly female, moderately built and nourished, sitting up comfortably in chair. Neck: Supple. No JVD or carotid bruit. No obvious mass or thyromegaly appreciated. Skin: Sutured laceration above left eyebrow without acute findings. Cardiovascular: S1 & S2 heard, RRR, S1/S2 +. No murmurs, rubs, gallops or clicks. No JVD or pedal edema. Telemetry personally reviewed: Sinus rhythm. Respiratory: Clear to auscultation without wheezing, rhonchi or crackles. No increased work of breathing. Abdominal:  Non distended, non tender & soft. No organomegaly  or masses appreciated. Normal bowel sounds heard. CNS: Alert and oriented. No focal deficits. Extremities: no edema, no cyanosis. Left knee exam without acute findings.    The results of significant diagnostics from this hospitalization (including imaging, microbiology, ancillary and laboratory) are listed below for reference.       Labs: CBC: Recent Labs  Lab 03/27/17 1857 03/28/17 0030 03/29/17 0633  WBC 10.8* 10.5 7.8  NEUTROABS 8.3*  --   --   HGB 11.2* 11.2* 9.7*  HCT 35.9* 34.8* 31.1*  MCV 84.3 83.3 84.3  PLT 318 297 573   Basic Metabolic Panel: Recent Labs  Lab 03/27/17 1857 03/28/17 0030 03/29/17 0633  NA 138 137 138  K 4.0 3.8 3.7  CL 104 105 107  CO2 25 24 24   GLUCOSE 123* 104* 147*  BUN 19 18 12   CREATININE 1.07* 1.01* 1.04*  CALCIUM 10.1 10.0 9.3   Liver Function Tests: Recent Labs  Lab 03/27/17 1857  AST 16  ALT 12*  ALKPHOS 53  BILITOT 1.3*  PROT 6.8  ALBUMIN 3.5   CBG: Recent Labs  Lab 03/28/17 1724 03/28/17 2149 03/29/17 0613 03/29/17 0741 03/29/17 1140  GLUCAP 185* 118* 147* 132* 131*    Thyroid function studies Recent Labs    03/28/17 0655  TSH 2.254   Urinalysis    Component Value Date/Time   COLORURINE YELLOW 03/28/2017 1730   APPEARANCEUR CLEAR 03/28/2017 1730   APPEARANCEUR Hazy 06/19/2014 1924   LABSPEC 1.042 (H) 03/28/2017 1730   LABSPEC 1.010 06/19/2014 1924   PHURINE 6.0 03/28/2017 1730   GLUCOSEU NEGATIVE 03/28/2017 1730   GLUCOSEU Negative 06/19/2014 1924   HGBUR NEGATIVE 03/28/2017 1730   BILIRUBINUR NEGATIVE 03/28/2017 1730   BILIRUBINUR Negative 06/19/2014 1924   KETONESUR NEGATIVE 03/28/2017 1730   PROTEINUR NEGATIVE 03/28/2017 1730   NITRITE NEGATIVE 03/28/2017 1730   LEUKOCYTESUR NEGATIVE 03/28/2017 1730   LEUKOCYTESUR 3+ 06/19/2014 1924    Discussed in detail with patient's sister and niece at bedside. Updated care and answered questions.  Time coordinating discharge: Over 30  minutes  SIGNED:  Vernell Leep, MD, FACP, [facility]. [facility] Pager [phone]  If 7PM-7AM, please contact night-coverage [email or web address] Password [email or web address] 03/29/2017, 2:55 PM

## 2017-03-29 NOTE — Progress Notes (Signed)
NT  Checked with pt at 1815, and nephew not here yet. Still waiting. At 1830, pt states it will now be 1900 before he gets here, states to NT that she already told her it would be 7:00. (pt had not said this to the NT or the RN), pt ready to go, waiting for nephew ride to get here, sister here at bedside with pt.

## 2017-03-29 NOTE — Progress Notes (Signed)
Discharge instructions reviewed with pt and her sister.  Copy of instructions given to pt, no scripts. Pt to call the front desk with dressed and ready to leave, pt wanting to eat dinner before leaving.  At 1720, pt has eaten but states her nephew is coming to pick her up  after he gets off work at 6:00pm, and should be here about 6:15.

## 2017-03-29 NOTE — Progress Notes (Signed)
Pt's nephew here, pt being discharged via wheelchair with belongings with family, escorted by unit NT.

## 2017-03-29 NOTE — Progress Notes (Signed)
Physical Therapy Treatment Patient Details Name: Sherry [name] MRN: [id number] DOB: 1935-02-26 Today's Date: 03/29/2017    History of Present Illness 83 yo female with syncopal episode was referred to PT after sustaining a fall and traumatizing L knee and sacral area.  New L eye laceration with sutures.  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  PMHx:  R BBB, anterolisthesis L4 on L5, CKD, DM, osteophytes in lumbar spine, L1-L5 disc height loss,     PT Comments    Pt is making good progress towards her goals today however is still limited in her mobility by generalized weakness, decreased L knee flexion and mild instability. Pt is currently mod I for bed mobility, min guard for transfers, ambulation of 200 feet with RW and ascent/descent of 5 steps without handrail assist. D/c plans continue to remain appropriate. PT will continue to work with pt until discharge.    Follow Up Recommendations  Home health PT;Supervision/Assistance - 24 hour;Supervision for mobility/OOB     Equipment Recommendations  Rolling walker with 5" wheels       Precautions / Restrictions Precautions Precautions: Fall(levoscoliosis lumbar spine with L2-3 apex) Precaution Comments: used SPC and rollator previous to trip to hosp Restrictions Weight Bearing Restrictions: No    Mobility  Bed Mobility Overal bed mobility: Modified Independent             General bed mobility comments: Pt is getting herself into and out of bed  Transfers Overall transfer level: Needs assistance Equipment used: Rolling walker (2 wheeled);1 person hand held assist Transfers: Sit to/from Stand Sit to Stand: Min guard         General transfer comment: min guard for safety, good power up and steadying with RW  Ambulation/Gait Ambulation/Gait assistance: Min guard Ambulation Distance (Feet): 200 Feet Assistive device: Rolling walker (2 wheeled);1 person  hand held assist Gait Pattern/deviations: Step-through pattern;Wide base of support;Shuffle;Decreased stride length;Decreased stance time - left Gait velocity: reduced Gait velocity interpretation: Below normal speed for age/gender General Gait Details: min guard for safety, vc for proximity to RW, and upright posture., decreased L knee flexion with gait   Stairs Stairs: Yes   Stair Management: One rail Right;Two rails;Step to pattern;Forwards Number of Stairs: 5(1x with bilateral rails, 1x without rails) General stair comments: hands on min guard for safety, vc for sequencing, education on need for someone to assist with ascent/descent getting into and out of house     Balance Overall balance assessment: Needs assistance Sitting-balance support: Bilateral upper extremity supported;Feet supported Sitting balance-Leahy Scale: Good     Standing balance support: Bilateral upper extremity supported;During functional activity Standing balance-Leahy Scale: Fair Standing balance comment: less than fair dynamically                            Cognition Arousal/Alertness: Awake/alert Behavior During Therapy: WFL for tasks assessed/performed Overall Cognitive Status: Within Functional Limits for tasks assessed                                 General Comments: had good tolerance for gait with RW bedside         General Comments General comments (skin integrity, edema, etc.): (L knee edema but able to achieve approximately 100 deg flexi)      Pertinent Vitals/Pain Pain Assessment: No/denies  pain    Home Living Family/patient expects to be discharged to:: Private residence Living Arrangements: Alone Available Help at Discharge: Family;Friend(s);Available PRN/intermittently Type of Home: House Home Access: Stairs to enter Entrance Stairs-Rails: Right;Left;Can reach both Home Layout: Multi-level Home Equipment: Walker - 4 wheels;Cane - single  point Additional Comments: Has sustained a fall into the L eye on a car and trip onto L knee, no new injuries or fractures noted    Prior Function Level of Independence: Independent with assistive device(s)      Comments: Pt has been in a staired home prior to trip to hosp   PT Goals (current goals can now be found in the care plan section) Acute Rehab PT Goals Patient Stated Goal: to get her knee pain to go away. PT Goal Formulation: With patient Time For Goal Achievement: 04/11/17 Potential to Achieve Goals: Good Progress towards PT goals: Progressing toward goals    Frequency    Min 4X/week      PT Plan Current plan remains appropriate       AM-PAC PT "6 Clicks" Daily Activity  Outcome Measure  Difficulty turning over in bed (including adjusting bedclothes, sheets and blankets)?: A Little Difficulty moving from lying on back to sitting on the side of the bed? : A Little Difficulty sitting down on and standing up from a chair with arms (e.g., wheelchair, bedside commode, etc,.)?: A Little Help needed moving to and from a bed to chair (including a wheelchair)?: A Little Help needed walking in hospital room?: A Lot Help needed climbing 3-5 steps with a railing? : Total 6 Click Score: 15    End of Session Equipment Utilized During Treatment: Gait belt Activity Tolerance: Patient tolerated treatment well;Patient limited by fatigue Patient left: in bed;with call bell/phone within reach;with bed alarm set;with family/visitor present Nurse Communication: Mobility status;Other (comment)(discharge plan) PT Visit Diagnosis: Unsteadiness on feet (R26.81);Other abnormalities of gait and mobility (R26.89);Muscle weakness (generalized) (M62.81);History of falling (Z91.81);Difficulty in walking, not elsewhere classified (R26.2);Pain Pain - Right/Left: Left Pain - part of body: Knee     Time: 9798-9211 PT Time Calculation (min) (ACUTE ONLY): 30 min  Charges:  $Gait Training:  23-37 mins                    G Codes:       Sherry [name] B. Migdalia Dk PT, DPT Acute Rehabilitation  [phone] Pager [phone]     [place] 03/29/2017, 3:44 PM

## 2017-04-02 ENCOUNTER — Other Ambulatory Visit: Payer: Self-pay | Admitting: *Deleted

## 2017-04-02 NOTE — Patient Outreach (Signed)
[place] [facility]) Care Management  04/02/2017  Sherry [name] 1934-10-28 [id number]   Patient contacted the [facility] office on 1//11/19 requesting Mobile Meals. Phone call to patient today to clarify her request. Patient was under the impression that she had contacted [employer] line, placing an order for hospital  post discharge meals and was trying to follow up.  This Education officer, museum contacted [employer] on patient's behalf and with her consent. Per [employer] no request for post hospital discharge meals had been placed. Order placed by this social worker and order  should arrive to her home as early as 04/05/08/ Authorization [id number].  Patient verbalized having no additional community resource needs.   [name] [facility] Care Management [phone]

## 2017-04-02 NOTE — Patient Outreach (Signed)
[facility] [facility]) Care Management  04/02/2017   Sherry [name] [birth year] [id number]  Subjective: RN Health Coach received return  telephone call from patient.  Hipaa compliance verified. Per patient she is not doing so good. Patient had been released from the hospital on 03/29/2017. Per patient she had fallen and has stitches over her eye brow. Per patient she felt herself get a little dizzy and the next thing she was on the ground with a lot of people around her. RN asked where her medical alert was. Patient had taken it off earlier and forgot to put it back on. Patient realizes she could have pushed the button when she first felt dizzy if she would have had it on. Per patient her blood sugar is 122 today. Patient stated she had refused to go into a rehab facility because they couldn't give her a length of stay time. Per patient she is not ready to move out of her house. Patient stated she is sore all over like she had been hit by a mack truck. Social worker has set up meals for this patient from [place] [employer]. Per patient her daughter is going to come for a while and help her. Patient has agreed to follow up outreach calls.   Current Medications:  Current Outpatient Medications  Medication Sig Dispense Refill  . Ascorbic Acid (VITAMIN C) 1000 MG tablet Take by mouth daily.     . Cholecalciferol (VITAMIN D3) 1000 units CAPS Take 1,000 Units by mouth daily.    Marland Kitchen estradiol (ESTRACE) 0.5 MG tablet TAKE 0.5 mg TABLET EVERY DAY    . furosemide (LASIX) 40 MG tablet Take 40 mg by mouth as needed for fluid or edema (for ankle swelling).    Marland Kitchen levothyroxine (SYNTHROID, LEVOTHROID) 25 MCG tablet TAKE 25 mcg TABLET EVERY DAY ON AN EMPTY STOMACH WITH A GLASS OF WATER AT LEAST 30 TO 60 MINUTES BEFORE BREAKFAST    . losartan (COZAAR) 25 MG tablet TAKE 25 mg TABLET EVERY DAY    . metFORMIN (GLUCOPHAGE) 500 MG tablet TAKE 500 mg TABLET TWICE DAILY WITH MEALS  Do not take on 1/10 or 03/30/17  due to contrast given for CT scan. Restart taking on 03/31/17.    Marland Kitchen montelukast (SINGULAIR) 10 MG tablet Take 10 mg by mouth daily.    . pravastatin (PRAVACHOL) 10 MG tablet Take 10 mg by mouth at bedtime.    . progesterone (PROMETRIUM) 200 MG capsule TAKE 200 mg CAPSULE EVERY NIGHT    . traMADol (ULTRAM) 50 MG tablet Take 50 mg by mouth every 12 (twelve) hours as needed.    . vitamin B-12 (CYANOCOBALAMIN) 1000 MCG tablet Take 1,000 mcg by mouth daily.     No current facility-administered medications for this visit.     Functional Status:  In your present state of health, do you have any difficulty performing the following activities: 04/02/2017 03/01/2017  Hearing? N N  Vision? Y Y  Difficulty concentrating or making decisions? [name]  Walking or climbing stairs? Y Y  Dressing or bathing? N N  Doing errands, shopping? [name]  Preparing Food and eating ? N N  Using the Toilet? N N  In the past six months, have you accidently leaked urine? N N  Do you have problems with loss of bowel control? N N  Managing your Medications? N N  Managing your Finances? N N  Housekeeping or managing your Housekeeping? N N  Some recent data might be  hidden    Fall/Depression Screening: Fall Risk  04/02/2017 03/01/2017 01/23/2017  Falls in the past year? Yes No No  Comment - - -  Number falls in past yr: 2 or more - -  Injury with Fall? Yes - Yes  Comment - - -  Risk Factor Category  High Fall Risk High Fall Risk High Fall Risk  Risk for fall due to : History of fall(s);Impaired balance/gait;Impaired mobility History of fall(s);Impaired balance/gait;Impaired mobility History of fall(s);Impaired balance/gait;Impaired mobility  Follow up Falls evaluation completed;Falls prevention discussed Falls evaluation completed;Falls prevention discussed Falls prevention discussed   PHQ 2/9 Scores 04/02/2017 03/01/2017 01/23/2017 12/19/2016 11/21/2016 10/19/2016 08/24/2016  PHQ - 2 Score 0 0 3 3 0 0 0  PHQ- 9 Score - 5 12 12  -  - -   THN CM Care Plan Problem One     Most Recent Value  Care Plan Problem One  knowledge deficit in self management of diabetes  Role Documenting the Problem One  [place] for Problem One  Active  THN Long Term Goal   Patient will continue to not have any admissions for diabetes in 90 days  THN Long Term Goal Start Date  04/02/17  Interventions for Problem One Long Term Goal  RN reiterated to patient to keep her appointment with physician. RN reminded patient to take medications as prescribed. RN will  follow up  monthly.  THN CM Short Term Goal #1   Patient will verbalize receiving Diabetic shoes that fit appropiately within the next 30 days  THN CM Short Term Goal #1 Start Date  04/02/17  Interventions for Short Term Goal #1  RRN discussed with patient about the shoes purchased and unable to wear due to ill fitting. Patient has not followed up since being in the hospital. . RN discussed with patient about talking with company shoes ordered.RN will follow up with patient  THN CM Short Term Goal #2   Patient will not have any falls within the next 30 days  THN CM Short Term Goal #2 Start Date  04/02/17  Interventions for Short Term Goal #2  RN discussed with patient about fall and etiology. RN will send patient additional falls prevention educational material. RN will follow up with patient for further discussion  THN CM Short Term Goal #3  Patient will wear medical alert at all time within the next 30 days  THN CM Short Term Goal #3 Start Date  04/02/17  Interventions for Short [place]ern Goal #3  RN discussed why patient should wear her medical alert on at all times, and why patient didn't have medical alert on at time of fall. RN will follow up with further discussion      Assessment:  Patient had recent fall and hospitalization Patient did not have her medical alert on at time of fall Patient has not received her proper fitting diabetic shoes Patient has sutures over  brow Patient will continue to  benefit from [place] telephonic outreach for education and support for diabetes self management.  Plan:  Patient will receive [employer] meals for 10 days Patient will wear Medical alert at all times RN reinforced no driving per physician orders for 6 months RN discussed falls and fall prevention RN sent additional fall safety information to patient Patient will look into getting shoes properly fitted RN will follow up outreach within the month of February Patient has made an appointment with PCP for follow up exam from  hospital admission  [place] Management [phone]

## 2017-04-02 NOTE — Patient Outreach (Signed)
[place] [facility]) Care Management  04/02/2017  [place] 12-21-1934 [id number]   Post hospital discharge meals ordered for patient through [facility]. Meals to arrive by 04/05/17 at the earliest by [employer] or [employer]. Authorization number [id number].    [name] [facility] Care Management [phone]

## 2017-04-02 NOTE — Patient Outreach (Signed)
[place] [facility]) Care Management  04/02/2017  [place] [birth year] [id number]   RN Health Coach attempted #1 follow up outreach call to patient.  Patient was unavailable. HIPPA compliance voicemail message left with return callback number.  Plan: RN will call patient again within 14 days.  [place] Care Management [phone]

## 2017-04-30 ENCOUNTER — Ambulatory Visit
Admission: RE | Admit: 2017-04-30 | Discharge: 2017-04-30 | Disposition: A | Discharge status: Home / Self Care | Payer: Medicare HMO | Source: Ambulatory Visit | Attending: Obstetrics and Gynecology | Admitting: Obstetrics and Gynecology

## 2017-04-30 DIAGNOSIS — Z1231 Encounter for screening mammogram for malignant neoplasm of breast: Secondary | ICD-10-CM | POA: Diagnosis present

## 2017-05-02 ENCOUNTER — Other Ambulatory Visit: Payer: Self-pay | Admitting: *Deleted

## 2017-05-02 NOTE — Patient Outreach (Signed)
[place] [facility]) Care Management  05/02/2017  [place] 16-Jun-1934 [id number]  RN Health Coach attempted #1 follow up outreach call to patient.  Patient was unavailable. HIPPA compliance voicemail message left with return callback number.  Plan: RN will call patient again within  business days.  [place] Care Management [phone]

## 2017-06-06 ENCOUNTER — Other Ambulatory Visit: Payer: Self-pay | Admitting: *Deleted

## 2017-06-08 NOTE — Patient Outreach (Signed)
[place] [address]) Care Management  06/08/2017  Late entry   Sherry [name] December 06, 1934 [id number]  [place] telephone call to patient.  Hipaa compliance verified. Per patient her fasting blood sugar is 120. Patient is eating healthy and maintained her diet. Patient is taking medications as per order. Per patient she has not gotten her diabetic shoes. She has been waiting for several months. Patient discussed findings from ct scan of thyroid and stated an ultrasound was done. Patient has a scheduled appointment with endocrinologist. RN discussed keeping that appointment to follow through with her care. Patient has agreed to follow up outreach calls.   Current Medications:  Current Outpatient Medications  Medication Sig Dispense Refill  . Ascorbic Acid (VITAMIN C) 1000 MG tablet Take by mouth daily.     . Cholecalciferol (VITAMIN D3) 1000 units CAPS Take 1,000 Units by mouth daily.    Marland Kitchen estradiol (ESTRACE) 0.5 MG tablet TAKE 0.5 mg TABLET EVERY DAY    . furosemide (LASIX) 40 MG tablet Take 40 mg by mouth as needed for fluid or edema (for ankle swelling).    Marland Kitchen levothyroxine (SYNTHROID, LEVOTHROID) 25 MCG tablet TAKE 25 mcg TABLET EVERY DAY ON AN EMPTY STOMACH WITH A GLASS OF WATER AT LEAST 30 TO 60 MINUTES BEFORE BREAKFAST    . losartan (COZAAR) 25 MG tablet TAKE 25 mg TABLET EVERY DAY    . metFORMIN (GLUCOPHAGE) 500 MG tablet TAKE 500 mg TABLET TWICE DAILY WITH MEALS  Do not take on 1/10 or 03/30/17 due to contrast given for CT scan. Restart taking on 03/31/17.    Marland Kitchen montelukast (SINGULAIR) 10 MG tablet Take 10 mg by mouth daily.    . pravastatin (PRAVACHOL) 10 MG tablet Take 10 mg by mouth at bedtime.    . progesterone (PROMETRIUM) 200 MG capsule TAKE 200 mg CAPSULE EVERY NIGHT    . traMADol (ULTRAM) 50 MG tablet Take 50 mg by mouth every 12 (twelve) hours as needed.    . vitamin B-12 (CYANOCOBALAMIN) 1000 MCG tablet Take 1,000 mcg by mouth daily.     No current  facility-administered medications for this visit.     Functional Status:  In your present state of health, do you have any difficulty performing the following activities: 06/06/2017 04/02/2017  Hearing? N N  Vision? Y Y  Difficulty concentrating or making decisions? [name]  Walking or climbing stairs? Y Y  Dressing or bathing? N N  Doing errands, shopping? [name]  Preparing Food and eating ? N N  Using the Toilet? N N  In the past six months, have you accidently leaked urine? N N  Do you have problems with loss of bowel control? N N  Managing your Medications? N N  Managing your Finances? N N  Housekeeping or managing your Housekeeping? N N  Some recent data might be hidden    Fall/Depression Screening: Fall Risk  06/06/2017 04/02/2017 03/01/2017  Falls in the past year? Yes Yes No  Comment - - -  Number falls in past yr: 2 or more 2 or more -  Injury with Fall? Yes Yes -  Comment - - -  Risk Factor Category  High Fall Risk High Fall Risk High Fall Risk  Risk for fall due to : History of fall(s);Impaired balance/gait;Impaired mobility History of fall(s);Impaired balance/gait;Impaired mobility History of fall(s);Impaired balance/gait;Impaired mobility  Follow up Falls evaluation completed;Falls prevention discussed Falls evaluation completed;Falls prevention discussed Falls evaluation completed;Falls prevention discussed   PHQ  2/9 Scores 06/06/2017 04/02/2017 03/01/2017 01/23/2017 12/19/2016 11/21/2016 10/19/2016  PHQ - 2 Score 0 0 0 3 3 0 0  PHQ- 9 Score 5 - 5 12 12  - -   THN CM Care Plan Problem One     Most Recent Value  Care Plan Problem One  knowledge deficit in self management of diabetes  Role Documenting the Problem One  [facility] [place] for Problem One  Active  THN Long Term Goal   Patient will not have any falls within the next 90 days  THN Long Term Goal Start Date  06/06/17  Interventions for Problem One Long Term Goal  RN discussed with patient about fall prevention. RN  discussed wearing medical alert. RN discussed medication adherence and keeping appointments. RN will follow up with further discussion  THN CM Short Term Goal #1   Patient will verbalize receiving Diabetic shoes that fit appropiately within the next 30 days  THN CM Short Term Goal #1 Start Date  06/06/17  Interventions for Short Term Goal #1  RN discussed with patient why she has not received shoes. Patient will contact another vendor to be fitter for diabetic shoes. RN will follow up for patient compliance with next outreach call.  THN CM Short Term Goal #2   Patient will report following up with endocrinologist for finding on CT scan within the next 30 days  THN CM Short Term Goal #2 Start Date  06/06/17  Interventions for Short Term Goal #2  Patient discussed findings on EGD. RN discussed with patient to follow up with the endocrinologist and not cancel appointment as patient was intitally. RN will follow up with patient for compliance with next outreach.       Assessment:  Fasting blood sugar 120 Patient has not received diabetic shoes Patient is maintaining her healthy diet Patient has not had any falls since last outreach Patient will continue to  benefit from [employer] telephonic outreach for education and support for diabetes self management.  Plan:  Patient will look at diabetic shoe vendors Patient will follow up with endocrinologist  RN will follow up outreach within the month of April RN will send patient educational information on shingles  [place] Management [phone]

## 2017-07-12 ENCOUNTER — Other Ambulatory Visit: Payer: Self-pay | Admitting: *Deleted

## 2017-07-12 NOTE — Patient Outreach (Signed)
[facility]) Care Management  07/12/2017   Sherry [name] [phone] RN Health Coach telephone call to patient.  Hipaa compliance verified. Per patient her fasting blood sugar was 124. Her highest has been 135. Patient is monitoring her diet closely. Per patient she is having a lot of pain in her shoulder and back since the fall. Patient stated that the tylenol is helping. Patient was to go to the ortho for appointment but had to cancel due to sister surgery and she had to go help her. RN discussed since sister is better to reschedule appointments. Patient went to see endocrinologist. Patient has agreed with follow up outreach calls Current Medications:  Current Outpatient Medications  Medication Sig Dispense Refill  . Ascorbic Acid (VITAMIN C) 1000 MG tablet Take by mouth daily.     . Cholecalciferol (VITAMIN D3) 1000 units CAPS Take 1,000 Units by mouth daily.    Marland Kitchen estradiol (ESTRACE) 0.5 MG tablet TAKE 0.5 mg TABLET EVERY DAY    . furosemide (LASIX) 40 MG tablet Take 40 mg by mouth as needed for fluid or edema (for ankle swelling).    Marland Kitchen levothyroxine (SYNTHROID, LEVOTHROID) 25 MCG tablet TAKE 25 mcg TABLET EVERY DAY ON AN EMPTY STOMACH WITH A GLASS OF WATER AT LEAST 30 TO 60 MINUTES BEFORE BREAKFAST    . losartan (COZAAR) 25 MG tablet TAKE 25 mg TABLET EVERY DAY    . metFORMIN (GLUCOPHAGE) 500 MG tablet TAKE 500 mg TABLET TWICE DAILY WITH MEALS  Do not take on 1/10 or 03/30/17 due to contrast given for CT scan. Restart taking on 03/31/17.    Marland Kitchen montelukast (SINGULAIR) 10 MG tablet Take 10 mg by mouth daily.    . pravastatin (PRAVACHOL) 10 MG tablet Take 10 mg by mouth at bedtime.    . progesterone (PROMETRIUM) 200 MG capsule TAKE 200 mg CAPSULE EVERY NIGHT    . traMADol (ULTRAM) 50 MG tablet Take 50 mg by mouth every 12 (twelve) hours as needed.    . vitamin B-12 (CYANOCOBALAMIN) 1000 MCG tablet Take 1,000 mcg by mouth daily.     No current  facility-administered medications for this visit.     Functional Status:  In your present state of health, do you have any difficulty performing the following activities: 07/12/2017 06/06/2017  Hearing? N N  Vision? Y Y  Difficulty concentrating or making decisions? [name]  Walking or climbing stairs? Y Y  Dressing or bathing? N N  Doing errands, shopping? [name]  Preparing Food and eating ? N N  Using the Toilet? N N  In the past six months, have you accidently leaked urine? N N  Do you have problems with loss of bowel control? N N  Managing your Medications? N N  Managing your Finances? N N  Housekeeping or managing your Housekeeping? - N  Some recent data might be hidden    Fall/Depression Screening: Fall Risk  07/12/2017 06/06/2017 04/02/2017  Falls in the past year? Yes Yes Yes  Comment - - -  Number falls in past yr: 2 or more 2 or more 2 or more  Injury with Fall? Yes Yes Yes  Comment - - -  Risk Factor Category  High Fall Risk High Fall Risk High Fall Risk  Risk for fall due to : History of fall(s);Impaired balance/gait;Impaired mobility History of fall(s);Impaired balance/gait;Impaired mobility History of fall(s);Impaired balance/gait;Impaired mobility  Follow up Falls evaluation completed;Falls prevention discussed Falls evaluation completed;Falls prevention discussed Falls evaluation  completed;Falls prevention discussed   PHQ 2/9 Scores 07/12/2017 06/06/2017 04/02/2017 03/01/2017 01/23/2017 12/19/2016 11/21/2016  PHQ - 2 Score 0 0 0 0 3 3 0  PHQ- 9 Score 5 5 - '5 12 12 ' -   THN CM Care Plan Problem One     Most Recent Value  Care Plan Problem One  knowledge deficit in self management of diabetes  Role Documenting the Problem One  [place] for Problem One  Active  THN Long Term Goal   Patient will not have any falls within the next 90 days  THN Long Term Goal Start Date  07/12/17  Interventions for Problem One Long Term Goal  Rn discusssed fall prevention. Patient is  having pain/ RN willfollow up with further discussion  THN CM Short Term Goal #1   Patient will verbalize receiving Diabetic shoes that fit appropiately within the next 30 days  THN CM Short Term Goal #1 Start Date  07/12/17  Interventions for Short Term Goal #1  RN discussed with patient about shoes/ Patient is lookin at going with another vendor  [facility] CM Short Term Goal #2 Met Date  07/12/17  [facility] CM Short Term Goal #3  Patient will wear medical alert at all time within the next 30 days  THN CM Short Term Goal #3 Met Date  07/12/17  [facility] CM Short Term Goal #4  Patient will verbalize making Health Maintenance appointments within the next 30 days  Interventions for Short Term Goal #4  Rn discussed with patient about reschduling her ortho appoinment since she cancelled and is still having pain. RN discussed follow up with eye appointment. Patient has made new PCP appointment for exam. RN will follow up with further outreach      Assessment:  Fasting blood sugar is 124 Pain in shoulder and back Patient has seen endocrinologist   Plan:  Patient will make health maintenance appointments Patient will follow up ortho regarding back pain Patient has schedule appointment with new PCP RN discussed fall prevention Patient will talk with pharmacist about patches for back and shoulder pain RN will follow up outreach within the month of June   Sherry [name] [place] Management [phone]

## 2017-08-20 ENCOUNTER — Other Ambulatory Visit: Payer: Self-pay | Admitting: *Deleted

## 2017-08-21 NOTE — Patient Outreach (Signed)
[facility]) Care Management  08/21/2017   [place] 02/23/1935 [id number]  RN Health Coach telephone call to patient.  Hipaa compliance verified. Per patient she is doing better. Patient is not having as much back pain as before. Patient A1C is 7.3. Patient is trying to eat better. Patient has changed primary care physicians. Patient is using a walker at all times to get around in the house. Per patient she is having some swelling in her ankles. The cardiologist has increased her medications. Patient has not started driving yet. She is afraid to go very far without someone helping her for fear of falling. Patient has agreed to follow up outreach calls.   Current Medications:  Current Outpatient Medications  Medication Sig Dispense Refill  . Ascorbic Acid (VITAMIN C) 1000 MG tablet Take by mouth daily.     . Cholecalciferol (VITAMIN D3) 1000 units CAPS Take 1,000 Units by mouth daily.    Marland Kitchen estradiol (ESTRACE) 0.5 MG tablet TAKE 0.5 mg TABLET EVERY DAY    . furosemide (LASIX) 40 MG tablet Take 40 mg by mouth as needed for fluid or edema (for ankle swelling).    Marland Kitchen levothyroxine (SYNTHROID, LEVOTHROID) 25 MCG tablet TAKE 25 mcg TABLET EVERY DAY ON AN EMPTY STOMACH WITH A GLASS OF WATER AT LEAST 30 TO 60 MINUTES BEFORE BREAKFAST    . losartan (COZAAR) 25 MG tablet TAKE 25 mg TABLET EVERY DAY    . metFORMIN (GLUCOPHAGE) 500 MG tablet TAKE 500 mg TABLET TWICE DAILY WITH MEALS  Do not take on 1/10 or 03/30/17 due to contrast given for CT scan. Restart taking on 03/31/17.    Marland Kitchen montelukast (SINGULAIR) 10 MG tablet Take 10 mg by mouth daily.    . pravastatin (PRAVACHOL) 10 MG tablet Take 10 mg by mouth at bedtime.    . progesterone (PROMETRIUM) 200 MG capsule TAKE 200 mg CAPSULE EVERY NIGHT    . traMADol (ULTRAM) 50 MG tablet Take 50 mg by mouth every 12 (twelve) hours as needed.    . vitamin B-12 (CYANOCOBALAMIN) 1000 MCG tablet Take 1,000 mcg by mouth daily.     No current  facility-administered medications for this visit.     Functional Status:  In your present state of health, do you have any difficulty performing the following activities: 07/12/2017 06/06/2017  Hearing? N N  Vision? Y Y  Difficulty concentrating or making decisions? [name]  Walking or climbing stairs? Y Y  Dressing or bathing? N N  Doing errands, shopping? [name]  Preparing Food and eating ? N N  Using the Toilet? N N  In the past six months, have you accidently leaked urine? N N  Do you have problems with loss of bowel control? N N  Managing your Medications? N N  Managing your Finances? N N  Housekeeping or managing your Housekeeping? - N  Some recent data might be hidden    Fall/Depression Screening: Fall Risk  08/20/2017 07/12/2017 06/06/2017  Falls in the past year? No Yes Yes  Comment - - -  Number falls in past yr: 2 or more 2 or more 2 or more  Injury with Fall? Yes Yes Yes  Comment - - -  Risk Factor Category  High Fall Risk High Fall Risk High Fall Risk  Risk for fall due to : History of fall(s);Impaired balance/gait;Impaired mobility History of fall(s);Impaired balance/gait;Impaired mobility History of fall(s);Impaired balance/gait;Impaired mobility  Follow up - Falls evaluation completed;Falls prevention discussed Falls  evaluation completed;Falls prevention discussed   PHQ 2/9 Scores 08/20/2017 07/12/2017 06/06/2017 04/02/2017 03/01/2017 01/23/2017 12/19/2016  PHQ - 2 Score 0 0 0 0 0 3 3  PHQ- 9 Score 5 5 5  - 5 12 12    THN CM Care Plan Problem One     Most Recent Value  Care Plan Problem One  knowledge deficit in self management of diabetes  Role Documenting the Problem One  [place] for Problem One  Active  THN Long Term Goal   Patient will not have any falls within the next 90 days  Interventions for Problem One Long Term Goal  RN reiterates to patient about fall prevention. Patient uses walker at all times. RN will follow up with patient for further teaching  [facility]  CM Short Term Goal #1   Patient will verbalize receiving Diabetic shoes that fit appropiately within the next 30 days  Interventions for Short Term Goal #1  Patient has talked about looking into another vendor, but has not followed through  [place] #2   Patient will verbalize discussing with doctor about getting into an exercise program at the [facility]  [facility] CM Short Term Goal #2 Start Date  08/21/17  Interventions for Short Term Goal #2  RN discussed with patient about getting into an exercise program. Patient will follow up with doctor about going to the [facility] silver sneakers program. RN w will follow up with firther outreach      Assessment:  Patient has went for 1st visit with new PCP Patient A1C is 7.3 Patient has not had any falls since last outreach  Plan:  Patient will discuss with physician about going back to Silver sneakers program at the [facility] Patient will continue to try eating healthy diet Patient wears medical alert at all times RN will follow up within the month of August  [name] BSN RN [place] Management [phone]

## 2017-11-13 ENCOUNTER — Other Ambulatory Visit: Payer: Self-pay | Admitting: Gastroenterology

## 2017-11-13 DIAGNOSIS — R1013 Epigastric pain: Secondary | ICD-10-CM

## 2017-11-14 ENCOUNTER — Other Ambulatory Visit: Payer: Self-pay | Admitting: *Deleted

## 2017-11-14 NOTE — Patient Outreach (Signed)
[facility]) Care Management  11/14/2017  Sherry [name] [birth year] [id number]   RN Health Coach attempted#1 follow up outreach call to patient.  Patient was unavailable. HIPPA compliance voicemail message left with return callback number.  Plan: RN will call patient again within 10 business days.  [place] Care Management [phone]

## 2017-11-16 ENCOUNTER — Ambulatory Visit: Payer: Medicare PPO

## 2017-11-19 ENCOUNTER — Encounter: Payer: Self-pay | Admitting: *Deleted

## 2017-11-21 ENCOUNTER — Ambulatory Visit: Admit: 2017-11-21 | Payer: Medicare PPO | Admitting: Internal Medicine

## 2017-11-21 SURGERY — LEFT HEART CATH AND CORONARY ANGIOGRAPHY
Anesthesia: Moderate Sedation | Laterality: Left

## 2017-11-28 ENCOUNTER — Other Ambulatory Visit: Payer: Self-pay | Admitting: *Deleted

## 2017-11-28 NOTE — Patient Outreach (Signed)
[facility]) Care Management  11/28/2017  [place] Oct 19, 1934 [id number]   RN Health Coach telephone call to patient.  Hipaa compliance verified. Per patient she is getting ready for therapy. Patient will call  back tomorrow.  [place] Care Management [phone]

## 2017-12-31 ENCOUNTER — Other Ambulatory Visit: Payer: Self-pay | Admitting: *Deleted

## 2018-01-07 NOTE — Patient Outreach (Signed)
[facility]) Care Management 12/31/2017   [place] 21-Jan-1935 [id number]   RN Health Coach telephone call to patient.  Hipaa compliance verified. Per patient Physical therapy is working with patient.   Plan. RN will call back  [place] Management [phone]

## 2018-01-14 ENCOUNTER — Other Ambulatory Visit: Payer: Self-pay | Admitting: *Deleted

## 2018-01-18 NOTE — Patient Outreach (Signed)
[name]) Care Management  01/18/2018   [place] [birth year] [id number]  RN Health Coach telephone call to patient.  Hipaa compliance verified. Per patient she is doing fair. Patient stated her fasting blood sugars are usually running 120-130 in the morning. Per patient she has been working with physical therapy. She has not been walking very good since her fall. Per patient her balance is off. Patient has not had any recent falls. Patient is using a walker or cane. Per patient she has a lot of weakness in her knees. He appetite is not very good. Patient has agreed to follow up outreach calls.   Current Medications:  Current Outpatient Medications  Medication Sig Dispense Refill  . Bromfenac Sodium (PROLENSA) 0.07 % SOLN Place 1 drop into both eyes daily.    Marland Kitchen estradiol (ESTRACE) 0.5 MG tablet Take 0.5 mg by mouth daily.     . furosemide (LASIX) 40 MG tablet Take 40 mg by mouth daily as needed for fluid or edema (for ankle swelling).     . isosorbide mononitrate (IMDUR) 30 MG 24 hr tablet Take 30 mg by mouth daily.    Marland Kitchen levothyroxine (SYNTHROID, LEVOTHROID) 25 MCG tablet TAKE 25 mcg TABLET EVERY DAY ON AN EMPTY STOMACH WITH A GLASS OF WATER AT LEAST 30 TO 60 MINUTES BEFORE BREAKFAST (Patient taking differently: Take 25 mcg by mouth daily before breakfast. )    . losartan (COZAAR) 50 MG tablet Take 50 mg by mouth daily.     . metFORMIN (GLUCOPHAGE) 500 MG tablet TAKE 500 mg TABLET TWICE DAILY WITH MEALS  Do not take on 1/10 or 03/30/17 due to contrast given for CT scan. Restart taking on 03/31/17. (Patient taking differently: Take 500 mg by mouth 2 (two) times daily with a meal. )    . montelukast (SINGULAIR) 10 MG tablet Take 10 mg by mouth daily.    . pantoprazole (PROTONIX) 40 MG tablet Take 40 mg by mouth 2 (two) times daily.    . progesterone (PROMETRIUM) 200 MG capsule Take 200 mg by mouth at bedtime.     . sucralfate (CARAFATE) 1 g tablet Take 1 g by mouth 4  (four) times daily -  with meals and at bedtime.    . traMADol (ULTRAM) 50 MG tablet Take 50 mg by mouth every 12 (twelve) hours as needed for moderate pain.      No current facility-administered medications for this visit.     Functional Status:  In your present state of health, do you have any difficulty performing the following activities: 01/14/2018 07/12/2017  Hearing? N N  Vision? Y Y  Difficulty concentrating or making decisions? Sherry [name]  Walking or climbing stairs? Y Y  Dressing or bathing? N N  Doing errands, shopping? Y Y  Conservation officer, nature and eating ? - N  Using the Toilet? - N  In the past six months, have you accidently leaked urine? - N  Do you have problems with loss of bowel control? - N  Managing your Medications? - N  Managing your Finances? - N  Housekeeping or managing your Housekeeping? - -  Some recent data might be hidden    Fall/Depression Screening: Fall Risk  01/14/2018 08/20/2017 07/12/2017  Falls in the past year? No No Yes  Comment - - -  Number falls in past yr: 2 or more 2 or more 2 or more  Injury with Fall? Yes Yes Yes  Comment - - -  Risk  Factor Category  High Fall Risk High Fall Risk High Fall Risk  Risk for fall due to : Impaired balance/gait;Impaired mobility History of fall(s);Impaired balance/gait;Impaired mobility History of fall(s);Impaired balance/gait;Impaired mobility  Follow up Falls evaluation completed;Falls prevention discussed - Falls evaluation completed;Falls prevention discussed   PHQ 2/9 Scores 01/14/2018 08/20/2017 07/12/2017 06/06/2017 04/02/2017 03/01/2017 01/23/2017  PHQ - 2 Score 0 0 0 0 0 0 3  PHQ- 9 Score - 5 5 5  - 5 12   THN CM Care Plan Problem One     Most Recent Value  Care Plan Problem One  knowledge deficit in self management of diabetes  Role Documenting the Problem One  [name]  Active  THN Long Term Goal   Patient will not have any falls within the next 90 days  THN Long Term Goal Start  Date  01/17/18  Interventions for Problem One Long Term Goal  RN discussed fall prevention. Physical therapy is working with patient on balance,  Rn discussed using cane and walker when ambulating.   THN CM Short Term Goal #1   Patient will verbalize receiving Diabetic shoes that fit appropiately within the next 30 days  THN CM Short Term Goal #1 Start Date  01/14/18  Interventions for Short Term Goal #1  RN discussed following up on shoes. Patient will need proper fitting shoes to assist with balance and footcare. RN will follow up for further discussion  THN CM Short Term Goal #2   Patient will verbalize receiving flu shot with next outreach  [facility] CM Short Term Goal #2 Start Date  01/14/18  Interventions for Short Term Goal #2  RN discussed getting the flu shot. Patient stated she will try to get within the next two weeks. RN will follow up for compliance        Assessment:  Patient has not had any recent falls Patient has not received flu shot 120 Fasting blood sugar Plan:  RN sent 2020 Calendar book Patient will document blood sugars Patient will follow medication adherence RN sent Ensure coupons Patient will get flu shot within the next 30 days RN will follow up within the month of January  Sherry [name] [place] Management [phone]

## 2018-02-06 ENCOUNTER — Encounter: Payer: Self-pay | Admitting: *Deleted

## 2018-03-08 ENCOUNTER — Ambulatory Visit: Payer: Medicare PPO | Admitting: *Deleted

## 2018-03-27 ENCOUNTER — Other Ambulatory Visit: Payer: Self-pay | Admitting: *Deleted

## 2018-03-27 NOTE — Patient Outreach (Signed)
[place] [facility]) Care Management  03/27/2018   [place] January 11, 1935 [id number]  RN Health Coach telephone call to patient.  Hipaa compliance verified. Per patient she is doing better. Patient fasting blood sugar is 110. Patient stated she is having to use her walker. Patient stated that she is not getting out much. She is using the transportation system to go to the doctor. Per patient she is able to drive but is unable to get out of the car without assistance. Patient has not followed up on getting diabetic shoes. Per patient she is going to the spa for her manicures. RN discussed the risk of infection if the toe nails are cut to short and cut the skin. Patient stated she will go to a podiatrist to get a foot exam and discuss diabetic shoes. RN discussed with patient about making health maintenance appointments. Patient has agreed to follow up outreach calls.   Current Medications:  Current Outpatient Medications  Medication Sig Dispense Refill  . Bromfenac Sodium (PROLENSA) 0.07 % SOLN Place 1 drop into both eyes daily.    Marland Kitchen estradiol (ESTRACE) 0.5 MG tablet Take 0.5 mg by mouth daily.     . furosemide (LASIX) 40 MG tablet Take 40 mg by mouth daily as needed for fluid or edema (for ankle swelling).     . isosorbide mononitrate (IMDUR) 30 MG 24 hr tablet Take 30 mg by mouth daily.    Marland Kitchen levothyroxine (SYNTHROID, LEVOTHROID) 25 MCG tablet TAKE 25 mcg TABLET EVERY DAY ON AN EMPTY STOMACH WITH A GLASS OF WATER AT LEAST 30 TO 60 MINUTES BEFORE BREAKFAST (Patient taking differently: Take 25 mcg by mouth daily before breakfast. )    . losartan (COZAAR) 50 MG tablet Take 50 mg by mouth daily.     . metFORMIN (GLUCOPHAGE) 500 MG tablet TAKE 500 mg TABLET TWICE DAILY WITH MEALS  Do not take on 1/10 or 03/30/17 due to contrast given for CT scan. Restart taking on 03/31/17. (Patient taking differently: Take 500 mg by mouth 2 (two) times daily with a meal. )    . montelukast (SINGULAIR)  10 MG tablet Take 10 mg by mouth daily.    . pantoprazole (PROTONIX) 40 MG tablet Take 40 mg by mouth 2 (two) times daily.    . progesterone (PROMETRIUM) 200 MG capsule Take 200 mg by mouth at bedtime.     . sucralfate (CARAFATE) 1 g tablet Take 1 g by mouth 4 (four) times daily -  with meals and at bedtime.    . traMADol (ULTRAM) 50 MG tablet Take 50 mg by mouth every 12 (twelve) hours as needed for moderate pain.      No current facility-administered medications for this visit.     Functional Status:  In your present state of health, do you have any difficulty performing the following activities: 01/14/2018 07/12/2017  Hearing? N N  Vision? Y Y  Difficulty concentrating or making decisions? [name]  Walking or climbing stairs? Y Y  Dressing or bathing? N N  Doing errands, shopping? Y Y  Conservation officer, nature and eating ? - N  Using the Toilet? - N  In the past six months, have you accidently leaked urine? - N  Do you have problems with loss of bowel control? - N  Managing your Medications? - N  Managing your Finances? - N  Housekeeping or managing your Housekeeping? - -  Some recent data might be hidden    Fall/Depression Screening: Fall  Risk  03/27/2018 01/14/2018 08/20/2017  Falls in the past year? 1 No No  Comment - - -  Number falls in past yr: 0 2 or more 2 or more  Injury with Fall? 1 Yes Yes  Comment - - -  Risk Factor Category  - High Fall Risk High Fall Risk  Risk for fall due to : History of fall(s);Impaired balance/gait;Impaired mobility Impaired balance/gait;Impaired mobility History of fall(s);Impaired balance/gait;Impaired mobility  Follow up Falls evaluation completed;Falls prevention discussed Falls evaluation completed;Falls prevention discussed -   PHQ 2/9 Scores 03/27/2018 01/14/2018 08/20/2017 07/12/2017 06/06/2017 04/02/2017 03/01/2017  PHQ - 2 Score 0 0 0 0 0 0 0  PHQ- 9 Score - - 5 5 5  - 5    Assessment:  Fasting blood sugar 110 Patient is using walker to  ambulate Physical therapy is complete  Plan:  Patient will go for eye exam in April Patient will follow up with a podiatrist appointment Patient will look into getting diabetic shoes RN sent patient glucerna and ensure coupons RN will follow up outreach within the month of March  Tonita Bills [place] Care Management [phone]

## 2018-04-03 ENCOUNTER — Other Ambulatory Visit: Payer: Self-pay | Admitting: Obstetrics and Gynecology

## 2018-04-03 ENCOUNTER — Other Ambulatory Visit: Payer: Self-pay | Admitting: Internal Medicine

## 2018-04-03 DIAGNOSIS — Z1231 Encounter for screening mammogram for malignant neoplasm of breast: Secondary | ICD-10-CM

## 2018-05-01 ENCOUNTER — Ambulatory Visit
Admission: RE | Admit: 2018-05-01 | Discharge: 2018-05-01 | Disposition: A | Discharge status: Home / Self Care | Payer: Medicare HMO | Source: Ambulatory Visit | Attending: Internal Medicine | Admitting: Internal Medicine

## 2018-05-01 DIAGNOSIS — Z1231 Encounter for screening mammogram for malignant neoplasm of breast: Secondary | ICD-10-CM

## 2018-05-20 DIAGNOSIS — R531 Weakness: Secondary | ICD-10-CM | POA: Insufficient documentation

## 2018-05-20 DIAGNOSIS — K219 Gastro-esophageal reflux disease without esophagitis: Secondary | ICD-10-CM | POA: Insufficient documentation

## 2018-05-29 ENCOUNTER — Other Ambulatory Visit: Payer: Self-pay | Admitting: *Deleted

## 2018-05-29 ENCOUNTER — Other Ambulatory Visit: Payer: Self-pay

## 2018-05-29 NOTE — Patient Outreach (Signed)
[name]) Care Management  05/29/2018   [place] 07/29/1934 [id number]  RN Health Coach telephone call to patient.  Hipaa compliance verified. Per patient she is having some abdominal pain. Patient stated it is like acid reflux. Patient is having an endo on May 7th. Patient fasting blood sugar is 113. Per patient it usually runs 110-130 fasting.  Per patient is is still currently using her walker. Patient stated she does not feel comfortable trying to walk far without it. She has not had any recent falls. . Per patient she is not currently driving. Patient has agreed to follow up outreach calls.   Current Medications:  Current Outpatient Medications  Medication Sig Dispense Refill  . Bromfenac Sodium (PROLENSA) 0.07 % SOLN Place 1 drop into both eyes daily.    Marland Kitchen estradiol (ESTRACE) 0.5 MG tablet Take 0.5 mg by mouth daily.     . furosemide (LASIX) 40 MG tablet Take 40 mg by mouth daily as needed for fluid or edema (for ankle swelling).     . isosorbide mononitrate (IMDUR) 30 MG 24 hr tablet Take 30 mg by mouth daily.    Marland Kitchen levothyroxine (SYNTHROID, LEVOTHROID) 25 MCG tablet TAKE 25 mcg TABLET EVERY DAY ON AN EMPTY STOMACH WITH A GLASS OF WATER AT LEAST 30 TO 60 MINUTES BEFORE BREAKFAST (Patient taking differently: Take 25 mcg by mouth daily before breakfast. )    . losartan (COZAAR) 50 MG tablet Take 50 mg by mouth daily.     . metFORMIN (GLUCOPHAGE) 500 MG tablet TAKE 500 mg TABLET TWICE DAILY WITH MEALS  Do not take on 1/10 or 03/30/17 due to contrast given for CT scan. Restart taking on 03/31/17. (Patient taking differently: Take 500 mg by mouth 2 (two) times daily with a meal. )    . montelukast (SINGULAIR) 10 MG tablet Take 10 mg by mouth daily.    . pantoprazole (PROTONIX) 40 MG tablet Take 40 mg by mouth 2 (two) times daily.    . progesterone (PROMETRIUM) 200 MG capsule Take 200 mg by mouth at bedtime.     . sucralfate (CARAFATE) 1 g tablet Take 1 g by mouth  4 (four) times daily -  with meals and at bedtime.    . traMADol (ULTRAM) 50 MG tablet Take 50 mg by mouth every 12 (twelve) hours as needed for moderate pain.      No current facility-administered medications for this visit.     Functional Status:  In your present state of health, do you have any difficulty performing the following activities: 05/29/2018 01/14/2018  Hearing? N N  Vision? N Y  Difficulty concentrating or making decisions? Y Y  Comment Per patient she is forgetful at times -  Walking or climbing stairs? N Y  Comment Patient ambulates with a walker -  Dressing or bathing? N N  Doing errands, shopping? Y Y  Comment Patient does not drive -  Preparing Food and eating ? N -  Using the Toilet? N -  In the past six months, have you accidently leaked urine? N -  Do you have problems with loss of bowel control? N -  Managing your Medications? N -  Managing your Finances? N -  Housekeeping or managing your Housekeeping? N -  Some recent data might be hidden    Fall/Depression Screening: Fall Risk  05/29/2018 03/27/2018 01/14/2018  Falls in the past year? 1 1 No  Comment - - -  Number falls in past  yr: 0 0 2 or more  Injury with Fall? 1 1 Yes  Comment - - -  Risk Factor Category  - - High Fall Risk  Risk for fall due to : History of fall(s);Impaired balance/gait;Impaired mobility History of fall(s);Impaired balance/gait;Impaired mobility Impaired balance/gait;Impaired mobility  Follow up Falls evaluation completed;Falls prevention discussed Falls evaluation completed;Falls prevention discussed Falls evaluation completed;Falls prevention discussed   PHQ 2/9 Scores 05/29/2018 03/27/2018 01/14/2018 08/20/2017 07/12/2017 06/06/2017 04/02/2017  PHQ - 2 Score 0 0 0 0 0 0 0  PHQ- 9 Score - - - 5 5 5  -   THN CM Care Plan Problem One     Most Recent Value  Care Plan Problem One  knowledge deficit in self management of diabetes  Role Documenting the Problem One  [place]  for Problem One  Active  THN Long Term Goal   Patient will not have any falls within the next 90 days  THN Long Term Goal Start Date  05/29/18  Interventions for Problem One Long Term Goal  RN reiterates fall prevention. Patient is still currently using a walker. Pt is coming back to work with patient. RN will follow up with further discussion  THN CM Short Term Goal #1   Patient will verbalize receiving Diabetic shoes that fit appropiately within the next 30 days  THN CM Short Term Goal #1 Start Date  05/29/18  Interventions for Short Term Goal #1  RN has discussed with patient . Patient has not followed up. Per patient she is changing benfits. RN will follow up for further discussion  THN CM Short Term Goal #3  Patient will follow up with health maintenance within the next 30 days  THN CM Short Term Goal #3 Start Date  05/29/18  Interventions for Short Tern Goal #3  RN reiterates health mainenance care. RN discussed keeping follow up appointments. RN discussed eye exa,s. RN reiterates medication adherence. RN willfollow up with further discussion      Assessment:  A1C is 7.2 Fasting blood sugar is 113 Patient is trying to eat healthier by no salt, bread and baking foods Patient has not had any falls since last outreach Per patient she is taking medications as per ordered Plan:  RN discussed Health Maintenance Patient is having an Endoscopy May 7th Patient has scheduled eye exam Per patient physicial Therapy is coming back to work with patient. RN will follow up outreach within the month of June   Addeline Calarco [place] [facility] [phone]

## 2018-07-12 IMAGING — CT CT ANGIO CHEST
2 of 9 series · 18 of 46 positions shown · IV contrast (OMNI)
Comparison: Chest CT February 27, 2013; chest radiograph [place]

CLINICAL DATA: Shortness of Breath

EXAM:
CT ANGIOGRAPHY CHEST WITH CONTRAST
TECHNIQUE: Multidetector CT imaging of the chest was performed using the
standard protocol during bolus administration of intravenous
contrast. Multiplanar CT image reconstructions and MIPs were
obtained to evaluate the vascular anatomy.
CONTRAST:  100mL G8WU8P-S3X IOPAMIDOL (G8WU8P-S3X) INJECTION 76%

[Series 6: thins · axial · 0.72mm/px · z∈[+1046,+1306]mm · 15 of 294 slices shown]
[im 17/294  lung]
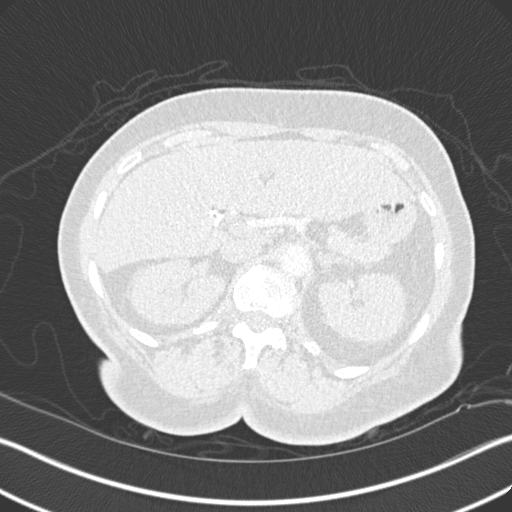
[im 33/294  soft-tissue]
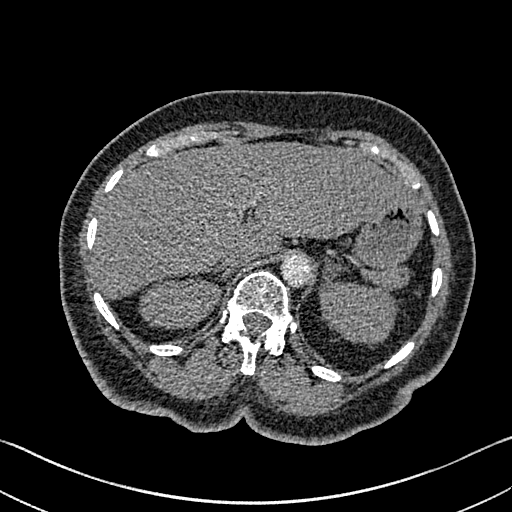
[im 49/294  lung]
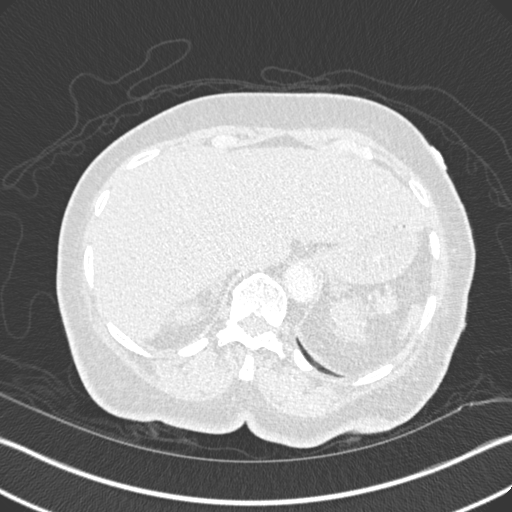
[im 66/294  soft-tissue]
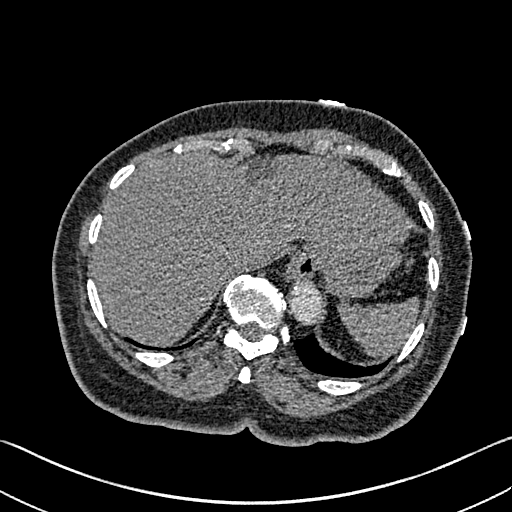
[im 98/294  lung]
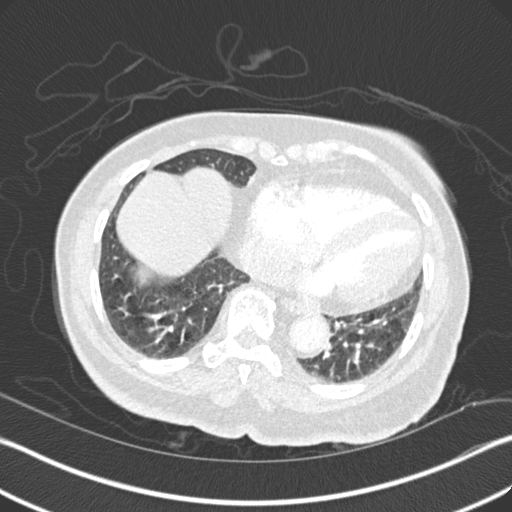
[im 114/294  soft-tissue]
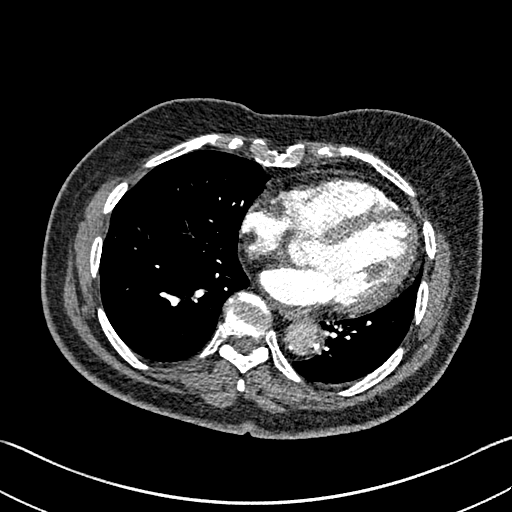
[im 131/294  lung]
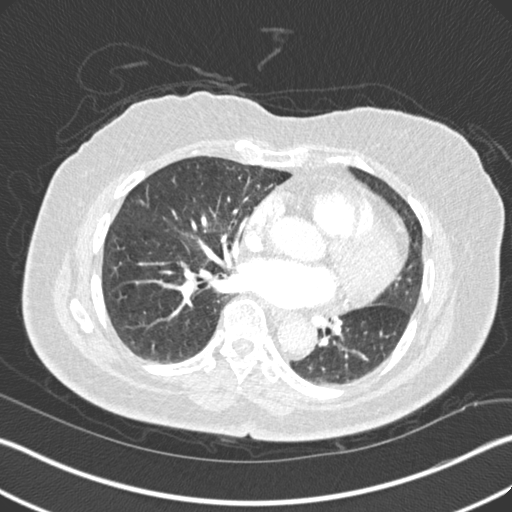
[im 147/294  soft-tissue]
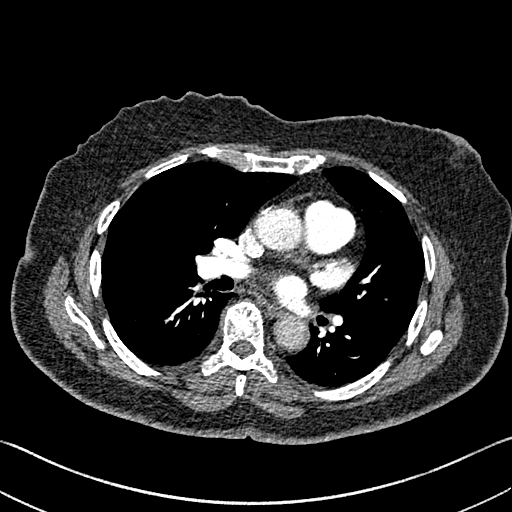
[im 163/294  lung]
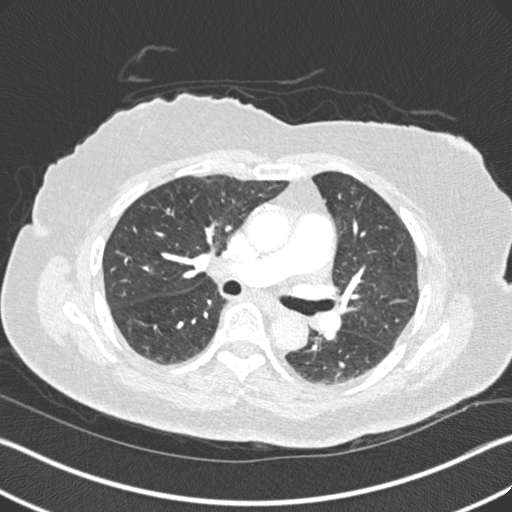
[im 180/294  soft-tissue]
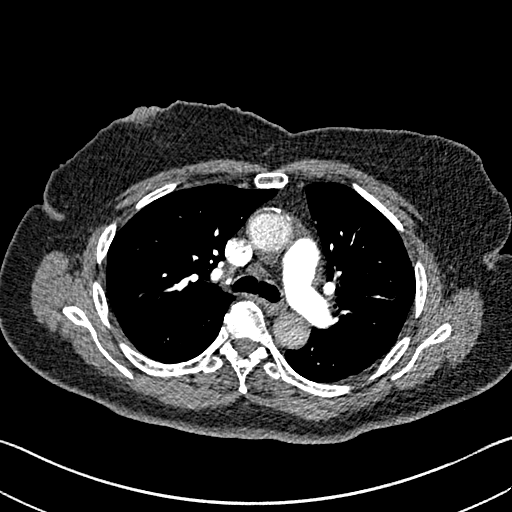
[im 196/294  lung]
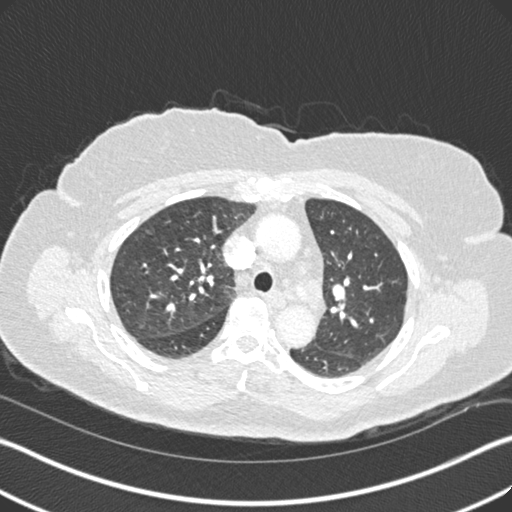
[im 228/294  soft-tissue]
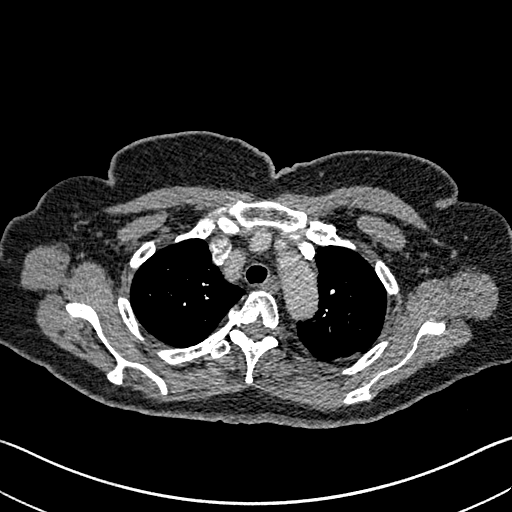
[im 245/294  lung]
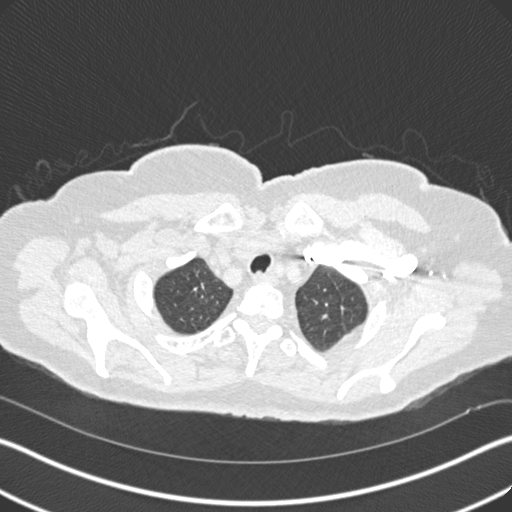
[im 261/294  soft-tissue]
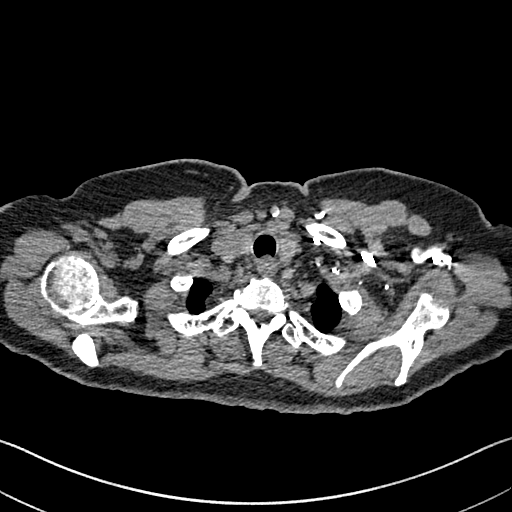
[im 277/294  lung]
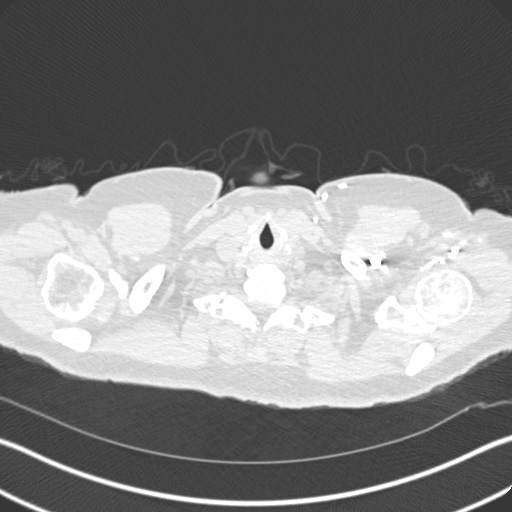

[Series 8: coronal mpr · coronal · 0.59mm/px · 3 of 142 slices shown]
[im 36/142  soft-tissue]
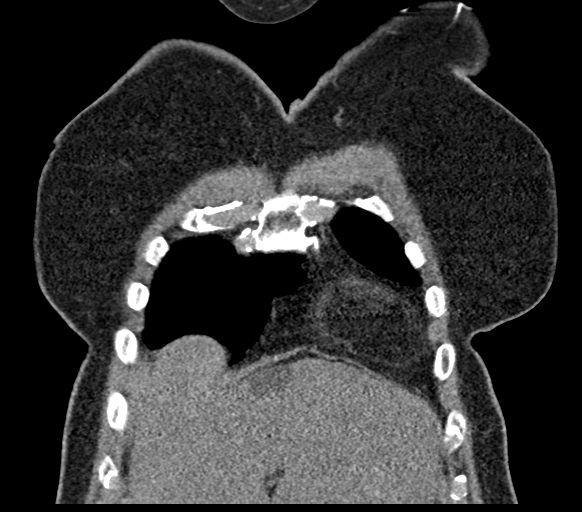
[im 71/142  soft-tissue]
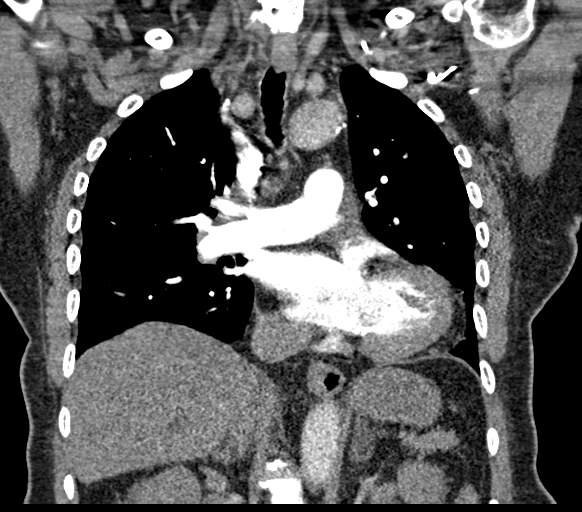
[im 106/142  soft-tissue]
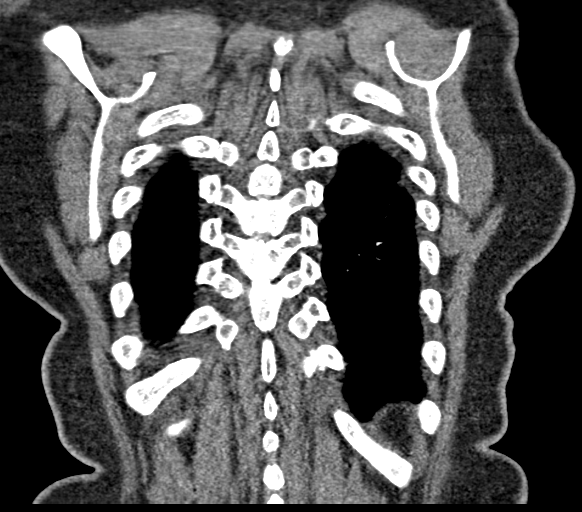

[18 of 46 positions shown; findings below may reference images not displayed]

FINDINGS: Cardiovascular: There is no demonstrable pulmonary embolus. There is
no appreciable thoracic aortic aneurysm or dissection. The
visualized great vessels appear unremarkable. There are foci of
atherosclerotic calcification in the aorta. There are scattered foci
of coronary artery calcification. There is no pericardial effusion
or pericardial thickening.

Mediastinum/Nodes: Previously noted diffuse thyroid enlargement has
resolved. There does remain a focal mass arising from the right lobe
of the thyroid measuring 2.3 x 1.7 cm.

There is no appreciable thoracic adenopathy. There is a small hiatal
hernia.

Lungs/Pleura: There is mild atelectatic change in the lung bases.
There is no edema or consolidation. No parenchymal lung nodular
opacities are evident currently. No pleural effusion or pleural
thickening evident.

Upper Abdomen: There is atherosclerotic calcification in the upper
abdominal aorta. Gallbladder is absent. There is an apparent cyst in
the medial segment of the left lobe of the liver measuring 2.5 x
cm. There is a cyst in the right lobe of the liver medially
measuring 1.4 x 1.0 cm. Visualized upper abdominal structures appear
normal.

Musculoskeletal: There is degenerative change in the thoracic spine.
There are no blastic or lytic bone lesions.

Review of the MIP images confirms the above findings.
IMPRESSION: 1.  No demonstrable pulmonary embolus.

2. **An incidental finding of potential clinical significance has
been found. Mass arising from right lobe of thyroid measuring 2.3 x
1.7 cm. Consider further evaluation with thyroid ultrasound. If
patient is clinically hyperthyroid, consider nuclear medicine
thyroid uptake and scan.**

3.  No edema or consolidation.

4.  No adenopathy.

5.  Small hiatal hernia.

6. Aortic atherosclerosis. There are foci of coronary artery
calcification.

7.  Gallbladder absent.

Aortic Atherosclerosis (R5DUQ-WV2.2).

## 2018-08-29 ENCOUNTER — Other Ambulatory Visit: Payer: Self-pay | Admitting: *Deleted

## 2018-08-30 ENCOUNTER — Other Ambulatory Visit: Payer: Self-pay

## 2018-08-30 NOTE — Patient Outreach (Signed)
[place] [facility]) Care Management  08/30/2018  TERREA BRUSTER 1934/07/27 [id number]   Successful outreach to patient regarding social work referral for meal assistance. BSW informed patient of [place] which will allow patient to receive prepared meals for 12 weeks. BSW submitted referral.  Will follow up with patient when first delivery date is confirmed.   Ronn Melena, BSW Social Worker [phone]

## 2018-08-30 NOTE — Patient Outreach (Signed)
[place] [facility]) Care Management  08/30/2018   [place] [birth year] [id number]  RN Health Coach telephone call to patient.  Hipaa compliance verified. Per patient her fasting blood sugar is 138. Patient stated that she is having some pain in her lower back. Per patient her balance is off. Patient ambulates with a walker. Per patient no recent falls. Physical therapy had been postponed due to the COVID -19.  Patient will start PT on July 1st. Patient is still not driving. Patient stated that her sister has been doing her shopping when she can. The patient daughter has test positive for COVID and has been unable to assist her. Patient has been checking her blood sugar. Per patient she is trying to eat better. She stated that her appetite is poor. Patient does drink Glucerna nutritional supplement.   Current Medications:  Current Outpatient Medications  Medication Sig Dispense Refill  . Bromfenac Sodium (PROLENSA) 0.07 % SOLN Place 1 drop into both eyes daily.    Marland Kitchen estradiol (ESTRACE) 0.5 MG tablet Take 0.5 mg by mouth daily.     . furosemide (LASIX) 40 MG tablet Take 40 mg by mouth daily as needed for fluid or edema (for ankle swelling).     . isosorbide mononitrate (IMDUR) 30 MG 24 hr tablet Take 30 mg by mouth daily.    Marland Kitchen levothyroxine (SYNTHROID, LEVOTHROID) 25 MCG tablet TAKE 25 mcg TABLET EVERY DAY ON AN EMPTY STOMACH WITH A GLASS OF WATER AT LEAST 30 TO 60 MINUTES BEFORE BREAKFAST (Patient taking differently: Take 25 mcg by mouth daily before breakfast. )    . losartan (COZAAR) 50 MG tablet Take 50 mg by mouth daily.     . metFORMIN (GLUCOPHAGE) 500 MG tablet TAKE 500 mg TABLET TWICE DAILY WITH MEALS  Do not take on 1/10 or 03/30/17 due to contrast given for CT scan. Restart taking on 03/31/17. (Patient taking differently: Take 500 mg by mouth 2 (two) times daily with a meal. )    . montelukast (SINGULAIR) 10 MG tablet Take 10 mg by mouth daily.    . pantoprazole  (PROTONIX) 40 MG tablet Take 40 mg by mouth 2 (two) times daily.    . progesterone (PROMETRIUM) 200 MG capsule Take 200 mg by mouth at bedtime.     . sucralfate (CARAFATE) 1 g tablet Take 1 g by mouth 4 (four) times daily -  with meals and at bedtime.    . traMADol (ULTRAM) 50 MG tablet Take 50 mg by mouth every 12 (twelve) hours as needed for moderate pain.      No current facility-administered medications for this visit.     Functional Status:  In your present state of health, do you have any difficulty performing the following activities: 05/29/2018 01/14/2018  Hearing? N N  Vision? N Y  Difficulty concentrating or making decisions? Y Y  Comment Per patient she is forgetful at times -  Walking or climbing stairs? N Y  Comment Patient ambulates with a walker -  Dressing or bathing? N N  Doing errands, shopping? Y Y  Comment Patient does not drive -  Preparing Food and eating ? N -  Using the Toilet? N -  In the past six months, have you accidently leaked urine? N -  Do you have problems with loss of bowel control? N -  Managing your Medications? N -  Managing your Finances? N -  Housekeeping or managing your Housekeeping? N -  Some recent data  might be hidden    Fall/Depression Screening: Fall Risk  05/29/2018 03/27/2018 01/14/2018  Falls in the past year? 1 1 No  Comment - - -  Number falls in past yr: 0 0 2 or more  Injury with Fall? 1 1 Yes  Comment - - -  Risk Factor Category  - - High Fall Risk  Risk for fall due to : History of fall(s);Impaired balance/gait;Impaired mobility History of fall(s);Impaired balance/gait;Impaired mobility Impaired balance/gait;Impaired mobility  Follow up Falls evaluation completed;Falls prevention discussed Falls evaluation completed;Falls prevention discussed Falls evaluation completed;Falls prevention discussed   PHQ 2/9 Scores 05/29/2018 03/27/2018 01/14/2018 08/20/2017 07/12/2017 06/06/2017 04/02/2017  PHQ - 2 Score 0 0 0 0 0 0 0  PHQ- 9 Score -  - - 5 5 5  -   THN CM Care Plan Problem One     Most Recent Value  Care Plan Problem One  knowledge deficit in self management of diabetes  Role Documenting the Problem One  Health [place] for Problem One  Active  THN CM Short Term Goal #1   Patient will verbalize receiving Diabetic shoes that fit appropiately within the next 30 days  THN CM Short Term Goal #1 Start Date  08/30/18  Interventions for Short Term Goal #1  Patient has put shoes on hold due to coronavirus. Patient has not been going out of the house much. Patient will reschedule at a later date.  THN CM Short Term Goal #2   Patient will verbaliz following up with physical therapy within the next 30 days  THN CM Short Term Goal #2 Start Date  08/30/18  Interventions for Short Term Goal #2  RN discusse when patient would be recieving physical therapy. Physical therapy had been posponed but is not rescheduled  to start July 1st. RN will follow up for compliance      Assessment:  Per patient appetite is poor No weight loss Fasting blood sugar 138 No recent falls Patient is not in an exercise program   Plan:  Referred to social worker regarding meals RN sent Glucerna coupons RN sent 2 cloth masks RN discussed eating healthy Patient will start physical therapy July 1st RN will follow up within the month of September  Nivaan Dicenzo [facility] Management [phone]

## 2018-09-02 ENCOUNTER — Other Ambulatory Visit: Payer: Self-pay

## 2018-09-02 NOTE — Patient Outreach (Signed)
[place] [facility]) Care Management  09/02/2018  Sherry [name] 1935-03-11 [id number]   Successful outreach to patient to inform her that she will receive her first meal delivery via [employer] on 09/04/18.  BSW is closing case at this time.  Ronn Melena, BSW Social Worker [phone]

## 2018-11-29 ENCOUNTER — Other Ambulatory Visit: Payer: Self-pay | Admitting: *Deleted

## 2018-11-29 NOTE — Patient Outreach (Signed)
[place] [facility]) Care Management  11/29/2018  [place] 1934/06/28 [id number]   RN Health Coach attempted follow up outreach call to patient.  Patient was unavailable. HIPPA compliance voicemail message left with return callback number.  Plan: RN will call patient again within 30 days.  [place] [facility] Management [phone]

## 2018-12-04 ENCOUNTER — Other Ambulatory Visit: Payer: Self-pay | Admitting: *Deleted

## 2018-12-06 NOTE — Patient Outreach (Signed)
[place] [facility]) Care Management  12/06/2018   [place] 09-Nov-1934 [id number]  RN Health Coach telephone call to patient.  Hipaa compliance verified. Per patient she is doing fair. FBS 135. Patient stated that she is still very unsteady and that her balance is still off. Patient uses a walker but would like a rollator walker. Patient has not had any recent falls. Patient has been using nutritional supplement of Glucerna. Patient has been receiving the delivery of [employer]ama meals and likes them very much. Patient has agreed to follow up outreach calls.  Current Medications:  Current Outpatient Medications  Medication Sig Dispense Refill  . Bromfenac Sodium (PROLENSA) 0.07 % SOLN Place 1 drop into both eyes daily.    Marland Kitchen estradiol (ESTRACE) 0.5 MG tablet Take 0.5 mg by mouth daily.     . furosemide (LASIX) 40 MG tablet Take 40 mg by mouth daily as needed for fluid or edema (for ankle swelling).     . isosorbide mononitrate (IMDUR) 30 MG 24 hr tablet Take 30 mg by mouth daily.    Marland Kitchen levothyroxine (SYNTHROID, LEVOTHROID) 25 MCG tablet TAKE 25 mcg TABLET EVERY DAY ON AN EMPTY STOMACH WITH A GLASS OF WATER AT LEAST 30 TO 60 MINUTES BEFORE BREAKFAST (Patient taking differently: Take 25 mcg by mouth daily before breakfast. )    . losartan (COZAAR) 50 MG tablet Take 50 mg by mouth daily.     . metFORMIN (GLUCOPHAGE) 500 MG tablet TAKE 500 mg TABLET TWICE DAILY WITH MEALS  Do not take on 1/10 or 03/30/17 due to contrast given for CT scan. Restart taking on 03/31/17. (Patient taking differently: Take 500 mg by mouth 2 (two) times daily with a meal. )    . montelukast (SINGULAIR) 10 MG tablet Take 10 mg by mouth daily.    . pantoprazole (PROTONIX) 40 MG tablet Take 40 mg by mouth 2 (two) times daily.    . progesterone (PROMETRIUM) 200 MG capsule Take 200 mg by mouth at bedtime.     . sucralfate (CARAFATE) 1 g tablet Take 1 g by mouth 4 (four) times daily -  with meals and at bedtime.    .  traMADol (ULTRAM) 50 MG tablet Take 50 mg by mouth every 12 (twelve) hours as needed for moderate pain.      No current facility-administered medications for this visit.     Functional Status:  In your present state of health, do you have any difficulty performing the following activities: 05/29/2018 01/14/2018  Hearing? N N  Vision? N Y  Difficulty concentrating or making decisions? Y Y  Comment Per patient she is forgetful at times -  Walking or climbing stairs? N Y  Comment Patient ambulates with a walker -  Dressing or bathing? N N  Doing errands, shopping? Y Y  Comment Patient does not drive -  Preparing Food and eating ? N -  Using the Toilet? N -  In the past six months, have you accidently leaked urine? N -  Do you have problems with loss of bowel control? N -  Managing your Medications? N -  Managing your Finances? N -  Housekeeping or managing your Housekeeping? N -  Some recent data might be hidden    Fall/Depression Screening: Fall Risk  12/04/2018 05/29/2018 03/27/2018  Falls in the past year? 1 1 1   Comment - - -  Number falls in past yr: 0 0 0  Injury with Fall? 1 1 1   Comment - - -  Risk Factor Category  - - -  Risk for fall due to : History of fall(s);Impaired balance/gait;Impaired mobility History of fall(s);Impaired balance/gait;Impaired mobility History of fall(s);Impaired balance/gait;Impaired mobility  Follow up Falls evaluation completed;Education provided;Falls prevention discussed Falls evaluation completed;Falls prevention discussed Falls evaluation completed;Falls prevention discussed   PHQ 2/9 Scores 12/04/2018 05/29/2018 03/27/2018 01/14/2018 08/20/2017 07/12/2017 06/06/2017  PHQ - 2 Score 0 0 0 0 0 0 0  PHQ- 9 Score - - - - 5 5 5    THN CM Care Plan Problem One     Most Recent Value  Care Plan Problem One  knowledge deficit in self management of diabetes  Role Documenting the Problem One  Health [place] for Problem One  Active  THN Long Term Goal    Patient will not have any falls within the next 90 days  Interventions for Problem One Long Term Goal  RN discussed fall prevention. RN discussed if patient need more physicial therapy. Patient is ccalling physician for more physical therapy and for a Rollator walker. RN will follow up for further discussion  THN CM Short Term Goal #1   Patient will verbalize receiving Diabetic shoes that fit appropiately within the next 30 days  Interventions for Short Term Goal #1  RN discussed with patient that she needs to get her proper shoes and this will help with the balance. Rn will follow up with further discussion  THN CM Short Term Goal #2   Patient will follow up with health maintenance within the next 90 days  THN CM Short Term Goal #2 Start Date  12/06/18  Interventions for Short Term Goal #2  RN reiterated health care maintenance. Patient will get flu shot. patient needs to follow up on diabetic shoes. RN will follow up for further discussion.      Assessment:  A1C 7.2 FBS 135 Patient will continue to benefit from [employer] telephonic outreach for education and support for diabetes self management. Plan:  RN sent 2 cloth masks RN sent Glucerna coupons RN discussed Health Maintenance RN reiterated healthy eating Patient will call physician for additional PT and a rollator walker [place] will follow up within the month of December  Jaycelyn Orrison [place] [facility] [phone]

## 2018-12-27 ENCOUNTER — Ambulatory Visit: Payer: Self-pay | Admitting: *Deleted

## 2019-02-26 ENCOUNTER — Other Ambulatory Visit: Payer: Self-pay | Admitting: *Deleted

## 2019-02-26 NOTE — Patient Outreach (Signed)
[place] [facility]) Care Management  [place]  02/26/2019   Sherry [name] May 03, 1934 [id number]  RN Health Coach telephone call to patient.  Hipaa compliance verified. Per patient she is doing better since she had went to the Dr and was given lasix. Patient stated she is breathing better. Per patient the swelling in her feet  Has decreased.  Patient is receiving mobile meals in which she stated has helped her a lot. Patient fasting blood sugar was 131. Patient A1C is 7.2. Patient has not had any recent falls. Patient is not going out very much due to the [place] epidemic and she has not started driving again. Patient has agreed to follow up outreach calls.   Encounter Medications:  Outpatient Encounter Medications as of 02/26/2019  Medication Sig  . Bromfenac Sodium (PROLENSA) 0.07 % SOLN Place 1 drop into both eyes daily.  Marland Kitchen estradiol (ESTRACE) 0.5 MG tablet Take 0.5 mg by mouth daily.   . furosemide (LASIX) 40 MG tablet Take 40 mg by mouth daily as needed for fluid or edema (for ankle swelling).   . isosorbide mononitrate (IMDUR) 30 MG 24 hr tablet Take 30 mg by mouth daily.  Marland Kitchen levothyroxine (SYNTHROID, LEVOTHROID) 25 MCG tablet TAKE 25 mcg TABLET EVERY DAY ON AN EMPTY STOMACH WITH A GLASS OF WATER AT LEAST 30 TO 60 MINUTES BEFORE BREAKFAST (Patient taking differently: Take 25 mcg by mouth daily before breakfast. )  . losartan (COZAAR) 50 MG tablet Take 50 mg by mouth daily.   . metFORMIN (GLUCOPHAGE) 500 MG tablet TAKE 500 mg TABLET TWICE DAILY WITH MEALS  Do not take on 1/10 or 03/30/17 due to contrast given for CT scan. Restart taking on 03/31/17. (Patient taking differently: Take 500 mg by mouth 2 (two) times daily with a meal. )  . montelukast (SINGULAIR) 10 MG tablet Take 10 mg by mouth daily.  . pantoprazole (PROTONIX) 40 MG tablet Take 40 mg by mouth 2 (two) times daily.  . progesterone (PROMETRIUM) 200 MG capsule Take 200 mg by mouth at bedtime.   . sucralfate  (CARAFATE) 1 g tablet Take 1 g by mouth 4 (four) times daily -  with meals and at bedtime.  . traMADol (ULTRAM) 50 MG tablet Take 50 mg by mouth every 12 (twelve) hours as needed for moderate pain.    No facility-administered encounter medications on file as of 02/26/2019.     Functional Status:  In your present state of health, do you have any difficulty performing the following activities: 02/26/2019 05/29/2018  Hearing? N N  Vision? N N  Difficulty concentrating or making decisions? Y Y  Comment - Per patient she is forgetful at times  Walking or climbing stairs? Y N  Comment ambulates with walker Patient ambulates with a walker  Dressing or bathing? N N  Doing errands, shopping? Y Y  Comment - Patient does not Physiological scientist and eating ? N N  Using the Toilet? N N  In the past six months, have you accidently leaked urine? N N  Do you have problems with loss of bowel control? N N  Managing your Medications? N N  Managing your Finances? N N  Housekeeping or managing your Housekeeping? N N  Some recent data might be hidden    Fall/Depression Screening: Fall Risk  02/26/2019 12/04/2018 05/29/2018  Falls in the past year? 1 1 1   Comment - - -  Number falls in past yr: 1 0 0  Injury with Fall? 1 1 1   Comment - - -  Risk Factor Category  Moderate Risk (1 Point) - -  Risk for fall due to : Impaired balance/gait;History of fall(s);Impaired mobility History of fall(s);Impaired balance/gait;Impaired mobility History of fall(s);Impaired balance/gait;Impaired mobility  Follow up Falls evaluation completed;Education provided;Falls prevention discussed Falls evaluation completed;Education provided;Falls prevention discussed Falls evaluation completed;Falls prevention discussed   PHQ 2/9 Scores 02/26/2019 12/04/2018 05/29/2018 03/27/2018 01/14/2018 08/20/2017 07/12/2017  PHQ - 2 Score 0 0 0 0 0 0 0  PHQ- 9 Score - - - - - 5 5   THN CM Care Plan Problem One     Most Recent Value  Care Plan  Problem One  knowledge deficit in self management of diabetes  Role Documenting the Problem One  [place] for Problem One  Active  THN Long Term Goal   Patient will see a decrease in A1C from 7.2 within next blood draw within the next 90 days  THN Long Term Goal Start Date  02/26/19  Interventions for Problem One Long Term Goal  RN discussed patient A1C goal. patient will have next A1C drawn in January.  RN discussed healthy eating. RN will follow up with further discussion  THN CM Short Term Goal #1   Patient will verbalize receiving Diabetic shoes that fit appropiately within the next 30 days  THN CM Short Term Goal #2   Patient will follow up with health maintenance within the next 90 days  THN CM Short Term Goal #2 Start Date  02/26/19  Interventions for Short Term Goal #2  RN reiterated with patient the importance of health maintenance such as eye exams. RN will follow up with further discussion      Assessment:  Appetite has improved No recent falls Patient is taking medications as per ordered Fasting blood sugar 131 A1C 7.2 Patient had recent eye exam Plan:  RN discussed monitoring blood sugar RN discussed patient A1C goal RN discussed healthy eating RN sent 2021 Calendar book RN discussed health maintenance RN will follow up within the month of March  Sherry [name] [place] Management [phone]

## 2019-05-28 ENCOUNTER — Other Ambulatory Visit: Payer: Self-pay | Admitting: *Deleted

## 2019-05-28 NOTE — Patient Outreach (Signed)
[place] [facility]) Care Management  [place]  05/28/2019   Sherry [name] May 23, 1934 [id number]  RN Health Coach telephone call to patient.  Hipaa compliance verified. Per patient her blood sugar is 121. This is not a fasting blood sugar. Her weight today is 183 lbs. Per patient she has been coughing up thick white sputum. She is not sure if it is sinus. She stated her cardiologist is aware.  Patient stated that she has a headache sometimes and takes over the counter medication. Patient stated that her Dr will be restarting physical therapy. She has not been exercising. Patient stated that she has been having some swelling in her ankle. Per patient this happens intermittently since she had twisted her ankle. She has been having some transportation issues going out and doing shopping since she has stopped driving. She uses's the insurance transportation for Dr appointments. Patient has a greed to follow up outreach call.  Encounter Medications:  Outpatient Encounter Medications as of 05/28/2019  Medication Sig  . Bromfenac Sodium (PROLENSA) 0.07 % SOLN Place 1 drop into both eyes daily.  Marland Kitchen estradiol (ESTRACE) 0.5 MG tablet Take 0.5 mg by mouth daily.   . furosemide (LASIX) 40 MG tablet Take 40 mg by mouth daily as needed for fluid or edema (for ankle swelling).   . isosorbide mononitrate (IMDUR) 30 MG 24 hr tablet Take 30 mg by mouth daily.  Marland Kitchen levothyroxine (SYNTHROID, LEVOTHROID) 25 MCG tablet TAKE 25 mcg TABLET EVERY DAY ON AN EMPTY STOMACH WITH A GLASS OF WATER AT LEAST 30 TO 60 MINUTES BEFORE BREAKFAST (Patient taking differently: Take 25 mcg by mouth daily before breakfast. )  . losartan (COZAAR) 50 MG tablet Take 50 mg by mouth daily.   . metFORMIN (GLUCOPHAGE) 500 MG tablet TAKE 500 mg TABLET TWICE DAILY WITH MEALS  Do not take on 1/10 or 03/30/17 due to contrast given for CT scan. Restart taking on 03/31/17. (Patient taking differently: Take 500 mg by mouth 2 (two)  times daily with a meal. )  . montelukast (SINGULAIR) 10 MG tablet Take 10 mg by mouth daily.  . pantoprazole (PROTONIX) 40 MG tablet Take 40 mg by mouth 2 (two) times daily.  . progesterone (PROMETRIUM) 200 MG capsule Take 200 mg by mouth at bedtime.   . sucralfate (CARAFATE) 1 g tablet Take 1 g by mouth 4 (four) times daily -  with meals and at bedtime.  . traMADol (ULTRAM) 50 MG tablet Take 50 mg by mouth every 12 (twelve) hours as needed for moderate pain.    No facility-administered encounter medications on file as of 05/28/2019.    Functional Status:  In your present state of health, do you have any difficulty performing the following activities: 05/28/2019 02/26/2019  Hearing? N N  Vision? N N  Difficulty concentrating or making decisions? N Y  Comment - -  Walking or climbing stairs? Y Y  Comment - ambulates with walker  Dressing or bathing? N N  Doing errands, shopping? Y Y  Comment patient uses a walker Facilities manager and eating ? - N  Using the Toilet? - N  In the past six months, have you accidently leaked urine? - N  Do you have problems with loss of bowel control? - N  Managing your Medications? - N  Managing your Finances? - N  Housekeeping or managing your Housekeeping? - N  Some recent data might be hidden    Fall/Depression Screening: Fall Risk  05/28/2019 02/26/2019 12/04/2018  Falls in the past year? 1 1 1   Comment - - -  Number falls in past yr: 1 1 0  Injury with Fall? 1 1 1   Comment - - -  Risk Factor Category  Moderate Risk (1 Point) Moderate Risk (1 Point) -  Risk for fall due to : History of fall(s);Impaired balance/gait;Impaired mobility;Impaired vision Impaired balance/gait;History of fall(s);Impaired mobility History of fall(s);Impaired balance/gait;Impaired mobility  Follow up Falls evaluation completed;Falls prevention discussed Falls evaluation completed;Education provided;Falls prevention discussed Falls evaluation completed;Education  provided;Falls prevention discussed   PHQ 2/9 Scores 05/28/2019 02/26/2019 12/04/2018 05/29/2018 03/27/2018 01/14/2018 08/20/2017  PHQ - 2 Score 0 0 0 0 0 0 0  PHQ- 9 Score - - - - - - 5   THN CM Care Plan Problem One     Most Recent Value  Care Plan Problem One  knowledge deficit in self management of diabetes  Role Documenting the Problem One  [place] for Problem One  Active  THN Long Term Goal   Patient will see a decrease in A1C from 7.2 within next blood draw within the next 90 days  THN Long Term Goal Start Date  05/28/19  Interventions for Problem One Long Term Goal  RN reiterated adhering to diet. RN discussed following up for A1C to be drawn. RN discussed what the patient blood sugars are. RN will follow up for compliance  THN CM Short Term Goal #1   Patient will verbalize receiving Diabetic shoes that fit appropiately within the next 30 days  THN CM Short Term Goal #1 Start Date  05/28/19  Interventions for Short Term Goal #1  RN discussed with patient about following through with making an appointment.  THN CM Short Term Goal #2   Patient will follow up with health maintenance within the next 90 days  THN CM Short Term Goal #2 Start Date  05/28/19  Interventions for Short Term Goal #2  RN discussed with patient about health maintenance. Patient is trying to get COVID vaccine. Patient will call and try to get a podiatrist visit scheduled. RN will follow up for compliance.      Assessment:  Last A1C 7.2 Non fasting blood sugar today 121 Wt 183 pounds Productive cough thick white sputum Intermittent headaches take over the counter medication On waiting list for COVID vaccine  Plan:  Patient will get COVID vaccines Patient will schedule appointment with Podiatry Referred to Social Worker Patient will continue using Pandemic safety precautions Next appointment with PCP [id number] Per patient she will be starting physical therapy RN will follow up outreach within the  month of April  Unika Nazareno [place] Management [phone]

## 2019-06-03 ENCOUNTER — Encounter: Payer: Self-pay | Admitting: *Deleted

## 2019-06-03 ENCOUNTER — Other Ambulatory Visit: Payer: Self-pay | Admitting: *Deleted

## 2019-06-03 NOTE — Patient Outreach (Signed)
[place] [facility]) Care Management  06/03/2019  Sherry [name] October 29, 1934 [id number]   CSW made an initial attempt to try and contact patient today to perform the initial phone assessment, as well as assess and assist with social work needs and services, without success.  A HIPAA compliant message was left for patient on voicemail.  CSW is currently awaiting a return call.  CSW will make a second outreach attempt within the next 3-4 business days, if a return call is not received from patient in the meantime.  CSW will also mail an Outreach Letter to patient's home requesting that patient contact CSW if patient is interested in receiving social work services through [place] with Scientist, clinical (histocompatibility and immunogenetics).  Nat Christen, BSW, MSW, LCSW  Licensed Education officer, environmental Health System  Mailing [name] [place], [place] [address] Physical Address-[address], [place], [place] [address] Toll Free Main # [phone] Fax # [phone] Cell # [phone]  Office # [phone] [name]

## 2019-06-04 ENCOUNTER — Encounter: Payer: Self-pay | Admitting: *Deleted

## 2019-06-04 ENCOUNTER — Other Ambulatory Visit: Payer: Self-pay | Admitting: *Deleted

## 2019-06-04 NOTE — Patient Outreach (Signed)
[name]) Care Management  06/04/2019  Sherry [name] Nov 19, 1934 [id number]   CSW received an incoming call from patient today, in response to the HIPAA compliant message left on voicemail for patient by CSW yesterday (Tuesday, June 03, 2019).  CSW was able to perform the initial phone assessment with patient, as well as assess and assist with social work needs and services.  CSW introduced self, explained role and types of services provided through [place] Management ([place] [facility]).  CSW further explained to patient that CSW works with patient's [place], also with [place] Management, [name].  CSW then explained the reason for the call, indicating that Sherry [name] thought that patient would benefit from social work services and resources to assist with arranging transportation to and from physician appointments.  CSW obtained two HIPAA compliant identifiers from patient, which included patient's name and date of birth.  Patient reported that her son, Sherry [name], as well as her daughter, Sherry [name] are currently transporting her, but that she would like to know what other transportation resources are available to her, outside of attending physician appointments.  CSW provided patient with the contact information for the following transportation agencies in [facility]:  [place] [phone]); [employer] [phone]); [place] [phone]); [employer] [phone]).  Patient admitted that she is able to call on her own behalf, to arrange transportation services, despite CSW offering to provide assistance with the referral process.  Patient does not qualify for Adult Medicaid; therefore, ineligible to receive Medicaid Transportation through [place].  CSW will perform  a case closure on patient, as all goals of treatment have been met from social work standpoint and no additional social work needs have been identified at this time.  CSW will notify patient's [place] with [place] Management, [name] of CSW's plans to close patient's case.  CSW will fax an update to patient's Primary Care Physician, Dr. Harrel Lemon to ensure that he is aware of CSW's involvement with patient's plan of care.  CSW was able to confirm that patient has the correct contact information for CSW, encouraging patient to contact CSW directly if additional social work needs arise in the near future, or if patient changes her mind about needing assistance with arranging transportation services.  Patient voiced understanding and was agreeable to this plan, appreciative of CSW's call and of resources provided.  Nat Christen, BSW, MSW, LCSW  Licensed Education officer, environmental Health System  Mailing [place] [address], [place], [place] [address] Physical Address-[address], [place], [place] [address] Toll Free Main # [phone] Fax # [phone] Cell # [phone]  Office # [phone] [email or web address]

## 2019-06-05 ENCOUNTER — Ambulatory Visit: Payer: Medicare Other | Admitting: *Deleted

## 2019-06-09 ENCOUNTER — Ambulatory Visit: Payer: Medicare Other | Admitting: *Deleted

## 2019-06-30 ENCOUNTER — Ambulatory Visit: Payer: Medicare Other | Attending: Internal Medicine

## 2019-06-30 DIAGNOSIS — Z23 Encounter for immunization: Secondary | ICD-10-CM

## 2019-06-30 NOTE — Progress Notes (Signed)
   Covid-19 Vaccination Clinic  Name:  Sherry [name]    MRN: [id number] DOB: 03-Oct-1934  06/30/2019  [name] was observed post Covid-19 immunization for 15 minutes without incident. She was provided with Vaccine Information Sheet and instruction to access the V-Safe system.   [name] was instructed to call 911 with any severe reactions post vaccine: Marland Kitchen Difficulty breathing  . Swelling of face and throat  . A fast heartbeat  . A bad rash all over body  . Dizziness and weakness   Immunizations Administered    Name Date Dose VIS Date Route   Pfizer COVID-19 Vaccine 06/30/2019 11:11 AM 0.3 mL 02/28/2019 Intramuscular   Manufacturer: [place]   Lot: [id number]   [place]: [id number]

## 2019-07-03 ENCOUNTER — Other Ambulatory Visit: Payer: Self-pay | Admitting: *Deleted

## 2019-07-03 NOTE — Patient Outreach (Signed)
[place] [facility]) Care Management  07/03/2019  Gu[place]upe [birth year] [id number]  RN Health Coach received  telephone call from patient.  Hipaa compliance verified. Per patient she was making Health Coach aware she was going to start receiving physical therapy. Patient asked about thinks she could to prevent her chronic kidney disease not go into stage 3. RN discussed monitoring blood sugars and keeping blood sugars , BP and CHF controlled. RN discussed healthy eating and adhering to diet.   Plan: RN sent Clinical Key education Preventing Chronic Kidney Disease RN sent Clinical Key education Chronic Kidney Disease RN sent Clinical Key education Food Basics for [place] BSN RN [place] Management [phone]

## 2019-07-11 ENCOUNTER — Other Ambulatory Visit: Payer: Self-pay | Admitting: *Deleted

## 2019-07-11 NOTE — Patient Outreach (Signed)
[place] [facility]) Care Management  07/11/2019  KADIENCE HOLBERT April 07, 1934 [id number]  RN Health Coach attempted follow up outreach call to patient.  Patient was unavailable. HIPPA compliance voicemail message left with return callback number.  Plan: RN will call patient again within 30 days.  [place] [facility] Management [phone]

## 2019-07-15 ENCOUNTER — Other Ambulatory Visit: Payer: Self-pay | Admitting: *Deleted

## 2019-07-18 NOTE — Patient Outreach (Signed)
[place] [facility]) Care Management  [place]  07/14/2019 Late entry  Sherry [name] May 19, 1934 [id number]  [place] telephone call to patient.  Hipaa compliance verified. Peratient her fbss 121. Patients checking blood sugars daily. Per patient she uses a walker to ambulate. Patient has not had any recent falls. Per patient her balance is off and she is going to do a little more physical therapy. Patient stated her appetite is fair. She is not eating as much. Patient is having pain and discomfort in her knees, joints and shoulder. Patient has agreed to follow up outreach calls.   Encounter Medications:  Outpatient Encounter Medications as of 07/15/2019  Medication Sig  . Bromfenac Sodium (PROLENSA) 0.07 % SOLN Place 1 drop into both eyes daily.  Marland Kitchen estradiol (ESTRACE) 0.5 MG tablet Take 0.5 mg by mouth daily.   . furosemide (LASIX) 40 MG tablet Take 40 mg by mouth daily as needed for fluid or edema (for ankle swelling).   . isosorbide mononitrate (IMDUR) 30 MG 24 hr tablet Take 30 mg by mouth daily.  Marland Kitchen levothyroxine (SYNTHROID, LEVOTHROID) 25 MCG tablet TAKE 25 mcg TABLET EVERY DAY ON AN EMPTY STOMACH WITH A GLASS OF WATER AT LEAST 30 TO 60 MINUTES BEFORE BREAKFAST (Patient taking differently: Take 25 mcg by mouth daily before breakfast. )  . losartan (COZAAR) 50 MG tablet Take 50 mg by mouth daily.   . metFORMIN (GLUCOPHAGE) 500 MG tablet TAKE 500 mg TABLET TWICE DAILY WITH MEALS  Do not take on 1/10 or 03/30/17 due to contrast given for CT scan. Restart taking on 03/31/17. (Patient taking differently: Take 500 mg by mouth 2 (two) times daily with a meal. )  . montelukast (SINGULAIR) 10 MG tablet Take 10 mg by mouth daily.  . pantoprazole (PROTONIX) 40 MG tablet Take 40 mg by mouth 2 (two) times daily.  . progesterone (PROMETRIUM) 200 MG capsule Take 200 mg by mouth at bedtime.   . sucralfate (CARAFATE) 1 g tablet Take 1 g by mouth 4 (four) times daily -  with  meals and at bedtime.  . traMADol (ULTRAM) 50 MG tablet Take 50 mg by mouth every 12 (twelve) hours as needed for moderate pain.    No facility-administered encounter medications on file as of 07/15/2019.    Functional Status:  In your present state of health, do you have any difficulty performing the following activities: 06/04/2019 05/28/2019  Hearing? N N  Vision? Y N  Comment Poor vision. -  Difficulty concentrating or making decisions? N N  Walking or climbing stairs? Y Y  Comment Unsteady gait, impaired balance. -  Dressing or bathing? N N  Doing errands, shopping? Y Y  Comment Unsteady gait, impaired balance. patient uses a Architectural technologist and eating ? N -  Using the Toilet? N -  In the past six months, have you accidently leaked urine? N -  Do you have problems with loss of bowel control? N -  Managing your Medications? N -  Managing your Finances? N -  Housekeeping or managing your Housekeeping? N -  Some recent data might be hidden    Fall/Depression Screening: Fall Risk  06/04/2019 05/28/2019 02/26/2019  Falls in the past year? 1 1 1   Comment - - -  Number falls in past yr: 1 1 1   Injury with Fall? 1 1 1   Comment - - -  Risk Factor Category  Moderate Risk (1 Point) Moderate Risk (1 Point)  Moderate Risk (1 Point)  Risk for fall due to : History of fall(s);Impaired balance/gait;Impaired mobility;Impaired vision History of fall(s);Impaired balance/gait;Impaired mobility;Impaired vision Impaired balance/gait;History of fall(s);Impaired mobility  Follow up Education provided;Falls prevention discussed Falls evaluation completed;Falls prevention discussed Falls evaluation completed;Education provided;Falls prevention discussed   PHQ 2/9 Scores 06/04/2019 05/28/2019 02/26/2019 12/04/2018 05/29/2018 03/27/2018 01/14/2018  PHQ - 2 Score 0 0 0 0 0 0 0  PHQ- 9 Score - - - - - - -   THN CM Care Plan Problem One     Most Recent Value  Care Plan Problem One  Knowledge deficit in  self management of diabetes  Role Documenting the Problem One  [place] for Problem One  Active  THN Long Term Goal   Patient will see a decrease in A1C from 7.2 within next blood draw within the next 90 days  Interventions for Problem One Long Term Goal  RN discussed continue to monitor blood sugars and to adhere to diet.  RN discussed medication adherence. Patient is awaiting next blood draw to see if closer to goal of 7. RN will follow up with further discussion  THN CM Short Term Goal #1   Patient will verbalize receiving Diabetic shoes that fit appropiately within the next 30 days  Interventions for Short Term Goal #1  Patient has not been going out much due to COVID and she has not started driving again due to dizziness. Patient wants to hold off for now.  THN CM Short Term Goal #2   Patient will follow up with health maintenance within the next 90 days  THN CM Short Term Goal #2 Start Date  07/18/19  Interventions for Short Term Goal #2  RN reiterates health maintenance. RN discusses keeping eye exams up to date.. RN will follow up with further discussion      Assessment:  Appetite fair FBS 121 Patient will be starting physical therapy Patient needs eye exam  Plan:  Patient is taking medications as prescribed RN discussed health maintenance RN sent Glucerna coupons RN discussed services available that will deliver groceries Patient will follow up for next A!C to be drawn Patient has agreed to follow up in July  Sherry [name] [place] Management [phone]

## 2019-07-23 ENCOUNTER — Ambulatory Visit: Payer: Medicare Other | Attending: Internal Medicine

## 2019-07-23 DIAGNOSIS — Z23 Encounter for immunization: Secondary | ICD-10-CM

## 2019-07-23 NOTE — Progress Notes (Signed)
   Covid-19 Vaccination Clinic  Name:  Sherry [name]    MRN: [id number] DOB: 11/22/1934  07/23/2019  [name] was observed post Covid-19 immunization for 15 minutes without incident. She was provided with Vaccine Information Sheet and instruction to access the V-Safe system.   [name] was instructed to call 911 with any severe reactions post vaccine: Marland Kitchen Difficulty breathing  . Swelling of face and throat  . A fast heartbeat  . A bad rash all over body  . Dizziness and weakness   Immunizations Administered    Name Date Dose VIS Date Route   Pfizer COVID-19 Vaccine 07/23/2019 11:40 AM 0.3 mL 05/14/2018 Intramuscular   Manufacturer: [employer]   Lot: [id number]   [place]: [id number]

## 2019-10-03 DIAGNOSIS — N189 Chronic kidney disease, unspecified: Secondary | ICD-10-CM | POA: Insufficient documentation

## 2019-10-03 DIAGNOSIS — E1122 Type 2 diabetes mellitus with diabetic chronic kidney disease: Secondary | ICD-10-CM | POA: Insufficient documentation

## 2019-10-03 DIAGNOSIS — N1832 Chronic kidney disease, stage 3b: Secondary | ICD-10-CM | POA: Insufficient documentation

## 2019-10-03 DIAGNOSIS — D631 Anemia in chronic kidney disease: Secondary | ICD-10-CM | POA: Insufficient documentation

## 2019-10-03 DIAGNOSIS — I129 Hypertensive chronic kidney disease with stage 1 through stage 4 chronic kidney disease, or unspecified chronic kidney disease: Secondary | ICD-10-CM | POA: Insufficient documentation

## 2019-10-08 ENCOUNTER — Other Ambulatory Visit: Payer: Self-pay | Admitting: *Deleted

## 2019-10-08 NOTE — Patient Outreach (Signed)
[facility] [facility]) Care Management  [place]  10/08/2019   AAVA DELAND 1934-05-17 [id number]  RN Health Coach telephone call to patient.  Hipaa compliance verified. Per patient her fasting blood sugar was 119. Patient is having swelling in her left foot. Patient stated that it is not painful. Patient does have a productive cough of clear thick sputum. Patient does very little exercise since physical therapy has stopped. Per patient her balance is still off and she feel dizzy. Patient is still not currently driving. Patient has not rescheduled her eye exam or mammogram. RN discussed the importance of health maintenance. Patient has agreed to follow up outreach calls.   Encounter Medications:  Outpatient Encounter Medications as of 10/08/2019  Medication Sig  . Bromfenac Sodium (PROLENSA) 0.07 % SOLN Place 1 drop into both eyes daily.  Marland Kitchen estradiol (ESTRACE) 0.5 MG tablet Take 0.5 mg by mouth daily.   . furosemide (LASIX) 40 MG tablet Take 40 mg by mouth daily as needed for fluid or edema (for ankle swelling).   . isosorbide mononitrate (IMDUR) 30 MG 24 hr tablet Take 30 mg by mouth daily.  Marland Kitchen levothyroxine (SYNTHROID, LEVOTHROID) 25 MCG tablet TAKE 25 mcg TABLET EVERY DAY ON AN EMPTY STOMACH WITH A GLASS OF WATER AT LEAST 30 TO 60 MINUTES BEFORE BREAKFAST (Patient taking differently: Take 25 mcg by mouth daily before breakfast. )  . losartan (COZAAR) 50 MG tablet Take 50 mg by mouth daily.   . metFORMIN (GLUCOPHAGE) 500 MG tablet TAKE 500 mg TABLET TWICE DAILY WITH MEALS  Do not take on 1/10 or 03/30/17 due to contrast given for CT scan. Restart taking on 03/31/17. (Patient taking differently: Take 500 mg by mouth 2 (two) times daily with a meal. )  . montelukast (SINGULAIR) 10 MG tablet Take 10 mg by mouth daily.  . pantoprazole (PROTONIX) 40 MG tablet Take 40 mg by mouth 2 (two) times daily.  . progesterone (PROMETRIUM) 200 MG capsule Take 200 mg by mouth at bedtime.     . sucralfate (CARAFATE) 1 g tablet Take 1 g by mouth 4 (four) times daily -  with meals and at bedtime.  . traMADol (ULTRAM) 50 MG tablet Take 50 mg by mouth every 12 (twelve) hours as needed for moderate pain.    No facility-administered encounter medications on file as of 10/08/2019.    Functional Status:  In your present state of health, do you have any difficulty performing the following activities: 06/04/2019 05/28/2019  Hearing? N N  Vision? Y N  Comment Poor vision. -  Difficulty concentrating or making decisions? N N  Walking or climbing stairs? Y Y  Comment Unsteady gait, impaired balance. -  Dressing or bathing? N N  Doing errands, shopping? Y Y  Comment Unsteady gait, impaired balance. patient uses a Architectural technologist and eating ? N -  Using the Toilet? N -  In the past six months, have you accidently leaked urine? N -  Do you have problems with loss of bowel control? N -  Managing your Medications? N -  Managing your Finances? N -  Housekeeping or managing your Housekeeping? N -  Some recent data might be hidden    Fall/Depression Screening: Fall Risk  10/08/2019 06/04/2019 05/28/2019  Falls in the past year? _0 Comment - - -  Number falls in past yr: _1 Injury with Fall? _2 Comment - - -  Risk  Factor Category  Moderate Risk (1 Point) Moderate Risk (1 Point) Moderate Risk (1 Point)  Risk for fall due to : History of fall(s);Impaired balance/gait;Impaired mobility History of fall(s);Impaired balance/gait;Impaired mobility;Impaired vision History of fall(s);Impaired balance/gait;Impaired mobility;Impaired vision  Follow up Falls evaluation completed;Falls prevention discussed Education provided;Falls prevention discussed Falls evaluation completed;Falls prevention discussed   PHQ 2/9 Scores 10/08/2019 06/04/2019 05/28/2019 02/26/2019 12/04/2018 05/29/2018 03/27/2018  PHQ - 2 Score 0 0 0 0 0 0 0  PHQ- 9 Score - - - - - - -   Goals Addressed               This Visit's Progress   .  Diabetes Patient stated goal (pt-stated)        CARE PLAN ENTRY (see longtitudinal plan of care for additional care plan information)  Objective:  . No results found for: HGBA1C Lab Results  Component Value Date   CREATININE 1.04 (H) 03/29/2017   CREATININE 1.01 (H) 03/28/2017   CREATININE 1.07 (H) 03/27/2017 .   Marland Kitchen No results found for: EGFR  Current Barriers:  Marland Kitchen Knowledge Deficits related to basic Diabetes pathophysiology and self care/management . Transportation  Case Manager Clinical Goal(s):  Over the next 90 days, patient will demonstrate improved adherence to prescribed treatment plan for diabetes self care/management as evidenced by:  Marland Kitchen Verbalize daily monitoring and recording of CBG within 90 days . Verbalize adherence to ADA/ carb modified diet within the next 90 days . Verbalize exercise 3 days/week  Interventions:  . Discussed plans with patient for ongoing care management follow up and provided patient with direct contact information for care management team . Reviewed scheduled/upcoming provider appointments including: Eye exam, mammogram and follow up on A1C. Marland Kitchen Referred to social worker for transportation needs . Provide Neurosurgeon on [employer] . Provided education on Exercise Program activity booklet . Provided patient information on medical alert system . Provided patient with number to call for compression hose  Patient Self Care Activities:  . Attends all scheduled provider appointments . Checks blood sugars as prescribed and utilize hyper and hypoglycemia protocol as needed . Adheres to prescribed ADA/carb modified . Will call and make appointments for eye exam and mammogram . Will order compression hose . Will review home exercise routine  RN will follow up within the month of October         Assessment:  Lt foot swelling Productive cough clear sputum Weight loss due to diuretics Patient has  used all [employer] transportation rides  Plan:  Referred to Education officer, museum Patient will schedule eye exam Patient will schedule mammogram RN willl follow up outreach within the month of October RN assessment update sent to PCP  [place] Management [phone]

## 2019-10-10 ENCOUNTER — Other Ambulatory Visit: Payer: Self-pay | Admitting: *Deleted

## 2019-10-10 NOTE — Patient Outreach (Signed)
[place] [facility]) Care Management  10/10/2019  Sherry [name] Apr 05, 1934 [id number]   CSW made initial contact with pt regarding transportation on 10/09/2019. Pt reports her sister has arranged for upcoming appointment rides with [employer].  Pt denies any need for rides at this time and is provided with CSW # to call if needs change.   CSW will sign off at this time and advise PCP and [facility] team of above.   Eduard Clos, MSW, [place] Worker  [place] [phone]

## 2019-11-10 DIAGNOSIS — N2581 Secondary hyperparathyroidism of renal origin: Secondary | ICD-10-CM | POA: Insufficient documentation

## 2019-11-13 ENCOUNTER — Ambulatory Visit
Admission: RE | Admit: 2019-11-13 | Discharge: 2019-11-13 | Disposition: A | Discharge status: Home / Self Care | Payer: Medicare Other | Source: Ambulatory Visit | Attending: Oncology | Admitting: Oncology

## 2019-11-13 ENCOUNTER — Inpatient Hospital Stay: Payer: Medicare Other

## 2019-11-13 ENCOUNTER — Inpatient Hospital Stay: Payer: Medicare Other | Attending: Oncology | Admitting: Oncology

## 2019-11-13 ENCOUNTER — Other Ambulatory Visit: Payer: Self-pay

## 2019-11-13 ENCOUNTER — Encounter: Payer: Self-pay | Admitting: Oncology

## 2019-11-13 VITALS — BP 133/66 | HR 64 | Temp 98.2°F | Wt 257.2 lb

## 2019-11-13 DIAGNOSIS — D472 Monoclonal gammopathy: Secondary | ICD-10-CM | POA: Diagnosis present

## 2019-11-13 DIAGNOSIS — N289 Disorder of kidney and ureter, unspecified: Secondary | ICD-10-CM | POA: Diagnosis not present

## 2019-11-13 DIAGNOSIS — Z79899 Other long term (current) drug therapy: Secondary | ICD-10-CM | POA: Insufficient documentation

## 2019-11-13 LAB — CBC
HCT: 35 % — ABNORMAL LOW (ref 36.0–46.0)
Hemoglobin: 11.5 g/dL — ABNORMAL LOW (ref 12.0–15.0)
MCH: 27.3 pg (ref 26.0–34.0)
MCHC: 32.9 g/dL (ref 30.0–36.0)
MCV: 83.1 fL (ref 80.0–100.0)
Platelets: 293 10*3/uL (ref 150–400)
RBC: 4.21 MIL/uL (ref 3.87–5.11)
RDW: 14.9 % (ref 11.5–15.5)
WBC: 8.1 10*3/uL (ref 4.0–10.5)
nRBC: 0 % (ref 0.0–0.2)

## 2019-11-13 LAB — BASIC METABOLIC PANEL
Anion gap: 7 (ref 5–15)
BUN: 32 mg/dL — ABNORMAL HIGH (ref 8–23)
CO2: 26 mmol/L (ref 22–32)
Calcium: 10.3 mg/dL (ref 8.9–10.3)
Chloride: 105 mmol/L (ref 98–111)
Creatinine, Ser: 1.34 mg/dL — ABNORMAL HIGH (ref 0.44–1.00)
GFR calc Af Amer: 42 mL/min — ABNORMAL LOW (ref >60)
GFR calc non Af Amer: 37 mL/min — ABNORMAL LOW (ref >60)
Glucose, Bld: 120 mg/dL — ABNORMAL HIGH (ref 70–99)
Potassium: 4.4 mmol/L (ref 3.5–5.1)
Sodium: 138 mmol/L (ref 135–145)

## 2019-11-13 NOTE — Progress Notes (Signed)
[name]  Telephone:(336) [phone] Fax:(336) [phone]  ID: Sherry [name] OB: 1934-10-21  MR#: [id number]  KGM#:[id number]  Patient Care Team: Baxter Hire, MD as PCP - General (Internal Medicine) Ple[place], Eppie Gibson, RN as [place] Management  I connected with Charleston Ropes on 11/13/19 at  1:30 PM EDT by video enabled telemedicine visit and verified that I am speaking with the correct person using two identifiers.   I discussed the limitations, risks, security and privacy concerns of performing an evaluation and management service by telemedicine and the availability of in-person appointments. I also discussed with the patient that there may be a patient responsible charge related to this service. The patient expressed understanding and agreed to proceed.   Other persons participating in the visit and their role in the encounter: Patient, MD.  Patient's location: Clinic. Provider's location: Home.  CHIEF COMPLAINT: MGUS  INTERVAL HISTORY: Patient is an 85 year old female who was noted to have hypercalcemia on routine blood work.  Subsequent work-up also revealed an incidental M spike.  She is referred for further evaluation.  She currently feels well and is asymptomatic.  She has no neurologic complaints.  She denies any pain.  She has a good appetite and denies weight loss.  She has no chest pain, shortness of breath, cough, or hemoptysis.  She denies any nausea, vomiting, constipation, or diarrhea.  She has no urinary complaints.  Patient feels at her baseline offers no specific complaints today.  REVIEW OF SYSTEMS:   Review of Systems  Constitutional: Negative.  Negative for fever, malaise/fatigue and weight loss.  Respiratory: Negative.  Negative for cough, hemoptysis and shortness of breath.   Cardiovascular: Negative.  Negative for chest pain and leg swelling.  Gastrointestinal: Negative.  Negative for abdominal pain.    Genitourinary: Negative.  Negative for dysuria.  Musculoskeletal: Negative.  Negative for back pain.  Skin: Negative.  Negative for rash.  Neurological: Negative.  Negative for dizziness, focal weakness, weakness and headaches.  Psychiatric/Behavioral: Negative.  The patient is not nervous/anxious.     As per HPI. Otherwise, a complete review of systems is negative.  PAST MEDICAL HISTORY: Past Medical History:  Diagnosis Date  . Allergy   . Diabetes mellitus without complication ([place])   . Thyroid disease     PAST SURGICAL HISTORY: Past Surgical History:  Procedure Laterality Date  . CHOLECYSTECTOMY      FAMILY HISTORY: Family History  Problem Relation Age of Onset  . Diabetes Mother   . Hypertension Mother   . Hypertension Father   . Cancer Brother     ADVANCED DIRECTIVES (Y/N):  N  HEALTH MAINTENANCE: Social History   Tobacco Use  . Smoking status: Former Smoker    Types: Cigarettes    Quit date: 2004    Years since quitting: 17.6  . Smokeless tobacco: Never Used  Vaping Use  . Vaping Use: Never used  Substance Use Topics  . Alcohol use: Not Currently  . Drug use: Not Currently     Colonoscopy:  PAP:  Bone density:  Lipid panel:  Allergies  Allergen Reactions  . Penicillins Swelling and Rash    Has patient had a PCN reaction causing immediate rash, facial/tongue/throat swelling, SOB or lightheadedness with hypotension: No Has patient had a PCN reaction causing severe rash involving mucus membranes or skin necrosis: No Has patient had a PCN reaction that required hospitalization: No Has patient had a PCN reaction occurring within the last 10  years: No If all of the above answers are "NO", then may proceed with Cephalosporin use.    Current Outpatient Medications  Medication Sig Dispense Refill  . albuterol (VENTOLIN HFA) 108 (90 Base) MCG/ACT inhaler Inhale 2 puffs into the lungs every 6 (six) hours as needed.    Marland Kitchen atenolol (TENORMIN) 25 MG tablet  Take 25 mg by mouth daily.    . Bromfenac Sodium (PROLENSA) 0.07 % SOLN Place 1 drop into both eyes daily.    . diclofenac Sodium (VOLTAREN) 1 % GEL Apply 2 g topically as needed.    Marland Kitchen estradiol (ESTRACE) 0.5 MG tablet Take 0.5 mg by mouth daily.     . furosemide (LASIX) 40 MG tablet Take 20 mg by mouth daily as needed for fluid or edema (for ankle swelling).     . gabapentin (NEURONTIN) 400 MG capsule Take 400 mg by mouth daily.    . isosorbide mononitrate (IMDUR) 30 MG 24 hr tablet Take 30 mg by mouth daily.    Marland Kitchen levothyroxine (SYNTHROID, LEVOTHROID) 25 MCG tablet TAKE 25 mcg TABLET EVERY DAY ON AN EMPTY STOMACH WITH A GLASS OF WATER AT LEAST 30 TO 60 MINUTES BEFORE BREAKFAST (Patient taking differently: Take 25 mcg by mouth daily before breakfast. )    . losartan (COZAAR) 50 MG tablet Take 50 mg by mouth daily.     . metFORMIN (GLUCOPHAGE) 500 MG tablet TAKE 500 mg TABLET TWICE DAILY WITH MEALS  Do not take on 1/10 or 03/30/17 due to contrast given for CT scan. Restart taking on 03/31/17. (Patient taking differently: Take 500 mg by mouth 2 (two) times daily with a meal. )    . montelukast (SINGULAIR) 10 MG tablet Take 10 mg by mouth daily.    . progesterone (PROMETRIUM) 200 MG capsule Take 200 mg by mouth at bedtime.     . sucralfate (CARAFATE) 1 g tablet Take 1 g by mouth 4 (four) times daily -  with meals and at bedtime.    . traMADol (ULTRAM) 50 MG tablet Take 50 mg by mouth every 12 (twelve) hours as needed for moderate pain.     . pantoprazole (PROTONIX) 40 MG tablet Take 40 mg by mouth 2 (two) times daily. (Patient not taking: Reported on 11/13/2019)     No current facility-administered medications for this visit.    OBJECTIVE: Vitals:   11/13/19 1355  BP: 133/66  Pulse: 64  Temp: 98.2 F (36.8 C)  SpO2: 100%     Body mass index is 41.51 kg/m.    ECOG FS:0 - Asymptomatic  General: Well-developed, well-nourished, no acute distress. Eyes: Pink conjunctiva, anicteric  sclera. HEENT: Normocephalic, moist mucous membranes. Lungs: No audible wheezing or coughing. Heart: Regular rate and rhythm. Abdomen: Soft, nontender, no obvious distention. Musculoskeletal: No edema, cyanosis, or clubbing. Neuro: Alert, answering all questions appropriately. Cranial nerves grossly intact. Skin: No rashes or petechiae noted. Psych: Normal affect. Lymphatics: No cervical, calvicular, axillary or inguinal LAD.   LAB RESULTS:  Lab Results  Component Value Date   NA 138 03/29/2017   K 3.7 03/29/2017   CL 107 03/29/2017   CO2 24 03/29/2017   GLUCOSE 147 (H) 03/29/2017   BUN 12 03/29/2017   CREATININE 1.04 (H) 03/29/2017   CALCIUM 9.3 03/29/2017   PROT 6.8 03/27/2017   ALBUMIN 3.5 03/27/2017   AST 16 03/27/2017   ALT 12 (L) 03/27/2017   ALKPHOS 53 03/27/2017   BILITOT 1.3 (H) 03/27/2017   GFRNONAA 49 (L)  03/29/2017   GFRAA 57 (L) 03/29/2017    Lab Results  Component Value Date   WBC 7.8 03/29/2017   NEUTROABS 8.3 (H) 03/27/2017   HGB 9.7 (L) 03/29/2017   HCT 31.1 (L) 03/29/2017   MCV 84.3 03/29/2017   PLT 280 03/29/2017     STUDIES: No results found.  ASSESSMENT: MGUS.  PLAN:    1.  Hypercalcemia: Patient's most recent calcium levels were reported at 11.4.  Etiology is unclear.  Will do a full myeloma work-up today and get a metastatic bone survey for completeness. 2. MGUS: Patient noted to have an M spike of 0.5 during work-up of her hypercalcemia.  We will do a full myeloma work-up today including bone survey as above.  Patient does not require bone marrow biopsy at this time, but will consider one in the future if necessary.  Return to clinic in 2 weeks for further evaluation, discussion of her results, and additional diagnostic planning if necessary. 3.  Renal insufficiency: Continue follow-up with nephrology as scheduled.  I provided 45 minutes of face-to-face video visit time during this encounter which included chart review, counseling, and  coordination of care as documented above.   Patient expressed understanding and was in agreement with this plan. She also understands that She can call clinic at any time with any questions, concerns, or complaints.   Cancer Staging No matching staging information was found for the patient.  Lloyd Huger, MD   11/13/2019 2:40 PM

## 2019-11-14 ENCOUNTER — Encounter: Payer: Medicare Other | Admitting: Internal Medicine

## 2019-11-14 ENCOUNTER — Other Ambulatory Visit: Payer: Medicare Other

## 2019-11-14 LAB — KAPPA/LAMBDA LIGHT CHAINS
Kappa free light chain: 31 mg/L — ABNORMAL HIGH (ref 3.3–19.4)
Kappa, lambda light chain ratio: 2.95 — ABNORMAL HIGH (ref 0.26–1.65)
Lambda free light chains: 10.5 mg/L (ref 5.7–26.3)

## 2019-11-14 LAB — PROTEIN ELECTROPHORESIS, SERUM
A/G Ratio: 1.3 (ref 0.7–1.7)
Albumin ELP: 3.9 g/dL (ref 2.9–4.4)
Alpha-1-Globulin: 0.2 g/dL (ref 0.0–0.4)
Alpha-2-Globulin: 0.8 g/dL (ref 0.4–1.0)
Beta Globulin: 1.1 g/dL (ref 0.7–1.3)
Gamma Globulin: 1 g/dL (ref 0.4–1.8)
Globulin, Total: 3 g/dL (ref 2.2–3.9)
M-Spike, %: 0.4 g/dL — ABNORMAL HIGH
Total Protein ELP: 6.9 g/dL (ref 6.0–8.5)

## 2019-11-22 NOTE — Progress Notes (Signed)
[place]  Telephone:(336) [phone] Fax:(336) [phone]  ID: Sherry [name] OB: [birth year]  MR#: [id number]  OJJ#:[id number]  Patient Care Team: Baxter Hire, MD as PCP - General (Internal Medicine) Pleasant, Eppie Gibson, RN as [place] Management   CHIEF COMPLAINT: MGUS  INTERVAL HISTORY: Patient returns to clinic today for further evaluation and discussion of her laboratory and imaging results.  She continues to feel well and remains asymptomatic. She has no neurologic complaints.  She denies any pain.  She has a good appetite and denies weight loss.  She has no chest pain, shortness of breath, cough, or hemoptysis.  She denies any nausea, vomiting, constipation, or diarrhea.  She has no urinary complaints.  Patient offers no specific complaints today.  REVIEW OF SYSTEMS:   Review of Systems  Constitutional: Negative.  Negative for fever, malaise/fatigue and weight loss.  Respiratory: Negative.  Negative for cough, hemoptysis and shortness of breath.   Cardiovascular: Negative.  Negative for chest pain and leg swelling.  Gastrointestinal: Negative.  Negative for abdominal pain.  Genitourinary: Negative.  Negative for dysuria.  Musculoskeletal: Negative.  Negative for back pain.  Skin: Negative.  Negative for rash.  Neurological: Negative.  Negative for dizziness, focal weakness, weakness and headaches.  Psychiatric/Behavioral: Negative.  The patient is not nervous/anxious.     As per HPI. Otherwise, a complete review of systems is negative.  PAST MEDICAL HISTORY: Past Medical History:  Diagnosis Date  . Allergy   . Diabetes mellitus without complication ([place])   . Thyroid disease     PAST SURGICAL HISTORY: Past Surgical History:  Procedure Laterality Date  . CHOLECYSTECTOMY      FAMILY HISTORY: Family History  Problem Relation Age of Onset  . Diabetes Mother   . Hypertension Mother   . Hypertension Father   . Cancer  Brother     ADVANCED DIRECTIVES (Y/N):  N  HEALTH MAINTENANCE: Social History   Tobacco Use  . Smoking status: Former Smoker    Types: Cigarettes    Quit date: 2004    Years since quitting: 17.7  . Smokeless tobacco: Never Used  Vaping Use  . Vaping Use: Never used  Substance Use Topics  . Alcohol use: Not Currently  . Drug use: Not Currently     Colonoscopy:  PAP:  Bone density:  Lipid panel:  Allergies  Allergen Reactions  . Penicillins Swelling and Rash    Has patient had a PCN reaction causing immediate rash, facial/tongue/throat swelling, SOB or lightheadedness with hypotension: No Has patient had a PCN reaction causing severe rash involving mucus membranes or skin necrosis: No Has patient had a PCN reaction that required hospitalization: No Has patient had a PCN reaction occurring within the last 10 years: No If all of the above answers are "NO", then may proceed with Cephalosporin use.    Current Outpatient Medications  Medication Sig Dispense Refill  . albuterol (VENTOLIN HFA) 108 (90 Base) MCG/ACT inhaler Inhale 2 puffs into the lungs every 6 (six) hours as needed.    Marland Kitchen atenolol (TENORMIN) 25 MG tablet Take 25 mg by mouth daily.    . Bromfenac Sodium (PROLENSA) 0.07 % SOLN Place 1 drop into both eyes daily.    . diclofenac Sodium (VOLTAREN) 1 % GEL Apply 2 g topically as needed.    Marland Kitchen estradiol (ESTRACE) 0.5 MG tablet Take 0.5 mg by mouth daily.     . furosemide (LASIX) 40 MG tablet Take 20 mg by  mouth daily as needed for fluid or edema (for ankle swelling).     . gabapentin (NEURONTIN) 400 MG capsule Take 400 mg by mouth daily.    . isosorbide mononitrate (IMDUR) 30 MG 24 hr tablet Take 30 mg by mouth daily.    Marland Kitchen levothyroxine (SYNTHROID, LEVOTHROID) 25 MCG tablet TAKE 25 mcg TABLET EVERY DAY ON AN EMPTY STOMACH WITH A GLASS OF WATER AT LEAST 30 TO 60 MINUTES BEFORE BREAKFAST (Patient taking differently: Take 25 mcg by mouth daily before breakfast. )    .  losartan (COZAAR) 50 MG tablet Take 50 mg by mouth daily.     . metFORMIN (GLUCOPHAGE) 500 MG tablet TAKE 500 mg TABLET TWICE DAILY WITH MEALS  Do not take on 1/10 or 03/30/17 due to contrast given for CT scan. Restart taking on 03/31/17. (Patient taking differently: Take 500 mg by mouth 2 (two) times daily with a meal. )    . montelukast (SINGULAIR) 10 MG tablet Take 10 mg by mouth daily.    Marland Kitchen omeprazole (PRILOSEC) 40 MG capsule Take 40 mg by mouth daily.    . progesterone (PROMETRIUM) 200 MG capsule Take 200 mg by mouth at bedtime.     . sucralfate (CARAFATE) 1 g tablet Take 1 g by mouth 4 (four) times daily -  with meals and at bedtime.    . traMADol (ULTRAM) 50 MG tablet Take 50 mg by mouth every 12 (twelve) hours as needed for moderate pain.      No current facility-administered medications for this visit.    OBJECTIVE: Vitals:   11/28/19 1030  BP: (!) 153/67  Pulse: 75  Resp: 20  Temp: (!) 97.5 F (36.4 C)  SpO2: 99%     Body mass index is 28.88 kg/m.    ECOG FS:0 - Asymptomatic  General: Well-developed, well-nourished, no acute distress.  Sitting in a wheelchair. Eyes: Pink conjunctiva, anicteric sclera. HEENT: Normocephalic, moist mucous membranes. Lungs: No audible wheezing or coughing. Heart: Regular rate and rhythm. Abdomen: Soft, nontender, no obvious distention. Musculoskeletal: No edema, cyanosis, or clubbing. Neuro: Alert, answering all questions appropriately. Cranial nerves grossly intact. Skin: No rashes or petechiae noted. Psych: Normal affect.    LAB RESULTS:  Lab Results  Component Value Date   NA 138 11/13/2019   K 4.4 11/13/2019   CL 105 11/13/2019   CO2 26 11/13/2019   GLUCOSE 120 (H) 11/13/2019   BUN 32 (H) 11/13/2019   CREATININE 1.34 (H) 11/13/2019   CALCIUM 10.3 11/13/2019   PROT 6.8 03/27/2017   ALBUMIN 3.5 03/27/2017   AST 16 03/27/2017   ALT 12 (L) 03/27/2017   ALKPHOS 53 03/27/2017   BILITOT 1.3 (H) 03/27/2017   GFRNONAA 37 (L)  11/13/2019   GFRAA 42 (L) 11/13/2019    Lab Results  Component Value Date   WBC 8.1 11/13/2019   NEUTROABS 8.3 (H) 03/27/2017   HGB 11.5 (L) 11/13/2019   HCT 35.0 (L) 11/13/2019   MCV 83.1 11/13/2019   PLT 293 11/13/2019     STUDIES: DG Bone Survey Met  Result Date: 11/13/2019 CLINICAL DATA:  Monoclonal gammopathy of unknown significance EXAM: METASTATIC BONE SURVEY COMPARISON:  None. FINDINGS: There are focal 4-5 mm lytic lesions identified within the distal diaphysis of the right clavicle, right sixth rib posteriorly, and left fourth and fifth ribs posteriorly which do not clearly demonstrate cortical destruction and are indeterminate. No clearly pathologic lytic lesions are identified. Changes of advanced degenerative disc disease are seen throughout  the cervical spine with loss of the normal cervical lordosis and prominent osteophyte formation within the retropharyngeal soft tissues Mild levocurvature of the lumbar spine. Degenerative changes are seen throughout the lumbar spine. Partial sacralization of L5. Mild bilateral hip degenerative arthritis. Advanced degenerative changes are noted within the knees bilaterally. IMPRESSION: Multiple lytic lesions within the right clavicle and ribs bilaterally which are indeterminate. Close attention on follow-up examination would be warranted. No definite pathologic lytic lesion identified. Electronically Signed   By: Fidela Salisbury MD   On: 11/13/2019 23:12    ASSESSMENT: MGUS.  PLAN:    1.  Hypercalcemia: Resolved.  Patient's most recent calcium levels are 10.3. 2.  MGUS: Patient has an M spike of 0.4 with a mild elevation of his kappa free light chains.  Immunoglobulins were not drawn.  She has mild renal insufficiency, but no other evidence of endorgan damage.  Metastatic bone survey revealed some indeterminate lesions in her right clavicle and ribs and will get a CT scan to further evaluate.  No further intervention is needed.  Patient does  not require bone marrow biopsy.  Return to clinic in 3 months with repeat laboratory work and further evaluation.   3.  Renal insufficiency: Chronic and unchanged.  Continue follow-up with nephrology as scheduled. 4.  Anemia: Mild, monitor.  Patient expressed understanding and was in agreement with this plan. She also understands that She can call clinic at any time with any questions, concerns, or complaints.   Cancer Staging No matching staging information was found for the patient.  Lloyd Huger, MD   11/29/2019 7:42 AM

## 2019-11-27 NOTE — Progress Notes (Signed)
Patient called for pre assessment. She denies any questions or concerns at this time.

## 2019-11-28 ENCOUNTER — Encounter: Payer: Self-pay | Admitting: Oncology

## 2019-11-28 ENCOUNTER — Inpatient Hospital Stay: Payer: Medicare Other | Attending: Oncology | Admitting: Oncology

## 2019-11-28 ENCOUNTER — Other Ambulatory Visit: Payer: Self-pay

## 2019-11-28 VITALS — BP 153/67 | HR 75 | Temp 97.5°F | Resp 20 | Wt 178.9 lb

## 2019-11-28 DIAGNOSIS — Z87891 Personal history of nicotine dependence: Secondary | ICD-10-CM | POA: Insufficient documentation

## 2019-11-28 DIAGNOSIS — E079 Disorder of thyroid, unspecified: Secondary | ICD-10-CM | POA: Diagnosis not present

## 2019-11-28 DIAGNOSIS — E119 Type 2 diabetes mellitus without complications: Secondary | ICD-10-CM | POA: Diagnosis not present

## 2019-11-28 DIAGNOSIS — N189 Chronic kidney disease, unspecified: Secondary | ICD-10-CM | POA: Diagnosis not present

## 2019-11-28 DIAGNOSIS — D472 Monoclonal gammopathy: Secondary | ICD-10-CM | POA: Insufficient documentation

## 2019-11-28 DIAGNOSIS — D649 Anemia, unspecified: Secondary | ICD-10-CM | POA: Insufficient documentation

## 2019-11-28 DIAGNOSIS — Z79899 Other long term (current) drug therapy: Secondary | ICD-10-CM | POA: Diagnosis not present

## 2019-12-18 ENCOUNTER — Other Ambulatory Visit: Payer: Self-pay

## 2019-12-18 ENCOUNTER — Ambulatory Visit
Admission: RE | Admit: 2019-12-18 | Discharge: 2019-12-18 | Disposition: A | Discharge status: Home / Self Care | Payer: Medicare Other | Source: Ambulatory Visit | Attending: Oncology | Admitting: Oncology

## 2019-12-18 DIAGNOSIS — R911 Solitary pulmonary nodule: Secondary | ICD-10-CM | POA: Diagnosis not present

## 2019-12-18 DIAGNOSIS — D472 Monoclonal gammopathy: Secondary | ICD-10-CM | POA: Insufficient documentation

## 2019-12-18 DIAGNOSIS — E041 Nontoxic single thyroid nodule: Secondary | ICD-10-CM | POA: Insufficient documentation

## 2019-12-18 DIAGNOSIS — I700 Atherosclerosis of aorta: Secondary | ICD-10-CM | POA: Diagnosis not present

## 2019-12-18 HISTORY — DX: Disorder of kidney and ureter, unspecified: N28.9

## 2019-12-18 LAB — POCT I-STAT CREATININE: Creatinine, Ser: 1.5 mg/dL — ABNORMAL HIGH (ref 0.44–1.00)

## 2019-12-18 MED ORDER — IOHEXOL 300 MG/ML  SOLN
75.0000 mL | Freq: Once | INTRAMUSCULAR | Status: AC | PRN
  Administered 2019-12-18: 60 mL via INTRAVENOUS

## 2019-12-30 ENCOUNTER — Other Ambulatory Visit: Payer: Self-pay | Admitting: *Deleted

## 2019-12-30 NOTE — Patient Outreach (Signed)
[place] [facility]) Care Management  12/30/2019  [place] 08-Jun-1934 [id number]  RN Health Coach attempted follow up outreach call to patient.  Patient was unavailable. HIPPA compliance voicemail message left with return callback number.  Plan: RN will call patient again within 30 days.  [place] Care Management [phone]

## 2020-01-02 ENCOUNTER — Telehealth: Payer: Self-pay | Admitting: *Deleted

## 2020-01-02 NOTE — Telephone Encounter (Signed)
CT essentially unchanged.  Keep f/u as scheduled.

## 2020-01-02 NOTE — Telephone Encounter (Signed)
Patient calling for results. Next appointment not until December  IMPRESSION: 1. 1.3 cm ground-glass opacity in the left upper lobe is minimally progressed 02/27/2013. Although the relative stability over such a long term suggests benign etiology, given the minimal progression, follow-up CT chest without contrast recommended in 12 months. 2. 4 mm anterior right upper lobe pulmonary nodule. Attention on follow-up recommended. 3. 2.1 cm left adrenal nodule has increased in size from 1.4 cm previously. This cannot be definitively characterized on today's exam. Attention on follow-up recommended. Abdominal MRI without with contrast recommended to further evaluate. 4. 1.2 cm right thyroid nodule. Likely benign, no follow-up recommended (ref: J Am Coll Radiol. 2015 Feb;12(2): 143-50). 5. Aortic Atherosclerosis (ICD10-I70.0).   Electronically Signed   By: Misty Stanley M.D.   On: 12/18/2019 15:36

## 2020-01-05 NOTE — Telephone Encounter (Signed)
Call returned to patient and informed of doctor response 

## 2020-01-08 ENCOUNTER — Other Ambulatory Visit: Payer: Self-pay | Admitting: *Deleted

## 2020-01-08 NOTE — Patient Instructions (Signed)
add

## 2020-01-08 NOTE — Patient Outreach (Signed)
[place] [facility]) Care Management  [place]  01/08/2020   Sherry [name] February 23, 1935 [id number]  RN Health Coach telephone call to patient.  Hipaa compliance verified. Per patient her fasting blood sugar is 136. Patient is having intermittent pain in her knee. Per patient she is keeping her legs elevated.  Patient has received her flu shot. Patient is currently not receiving physical therapy. Patient has agreed to follow up outreach calls.   Encounter Medications:  Outpatient Encounter Medications as of 01/08/2020  Medication Sig  . albuterol (VENTOLIN HFA) 108 (90 Base) MCG/ACT inhaler Inhale 2 puffs into the lungs every 6 (six) hours as needed.  Marland Kitchen atenolol (TENORMIN) 25 MG tablet Take 25 mg by mouth daily.  . Bromfenac Sodium (PROLENSA) 0.07 % SOLN Place 1 drop into both eyes daily.  . diclofenac Sodium (VOLTAREN) 1 % GEL Apply 2 g topically as needed.  Marland Kitchen estradiol (ESTRACE) 0.5 MG tablet Take 0.5 mg by mouth daily.   . furosemide (LASIX) 40 MG tablet Take 20 mg by mouth daily as needed for fluid or edema (for ankle swelling).   . gabapentin (NEURONTIN) 400 MG capsule Take 400 mg by mouth daily.  . isosorbide mononitrate (IMDUR) 30 MG 24 hr tablet Take 30 mg by mouth daily.  Marland Kitchen levothyroxine (SYNTHROID, LEVOTHROID) 25 MCG tablet TAKE 25 mcg TABLET EVERY DAY ON AN EMPTY STOMACH WITH A GLASS OF WATER AT LEAST 30 TO 60 MINUTES BEFORE BREAKFAST (Patient taking differently: Take 25 mcg by mouth daily before breakfast. )  . losartan (COZAAR) 50 MG tablet Take 50 mg by mouth daily.   . metFORMIN (GLUCOPHAGE) 500 MG tablet TAKE 500 mg TABLET TWICE DAILY WITH MEALS  Do not take on 1/10 or 03/30/17 due to contrast given for CT scan. Restart taking on 03/31/17. (Patient taking differently: Take 500 mg by mouth 2 (two) times daily with a meal. )  . montelukast (SINGULAIR) 10 MG tablet Take 10 mg by mouth daily.  Marland Kitchen omeprazole (PRILOSEC) 40 MG capsule Take 40 mg by mouth daily.   . progesterone (PROMETRIUM) 200 MG capsule Take 200 mg by mouth at bedtime.   . sucralfate (CARAFATE) 1 g tablet Take 1 g by mouth 4 (four) times daily -  with meals and at bedtime.  . traMADol (ULTRAM) 50 MG tablet Take 50 mg by mouth every 12 (twelve) hours as needed for moderate pain.    No facility-administered encounter medications on file as of 01/08/2020.    Functional Status:  In your present state of health, do you have any difficulty performing the following activities: 06/04/2019 05/28/2019  Hearing? N N  Vision? Y N  Comment Poor vision. -  Difficulty concentrating or making decisions? N N  Walking or climbing stairs? Y Y  Comment Unsteady gait, impaired balance. -  Dressing or bathing? N N  Doing errands, shopping? Y Y  Comment Unsteady gait, impaired balance. patient uses a Architectural technologist and eating ? N -  Using the Toilet? N -  In the past six months, have you accidently leaked urine? N -  Do you have problems with loss of bowel control? N -  Managing your Medications? N -  Managing your Finances? N -  Housekeeping or managing your Housekeeping? N -  Some recent data might be hidden    Fall/Depression Screening: Fall Risk  01/08/2020 10/08/2019 06/04/2019  Falls in the past year? 1 1 1   Comment - - -  Number falls  in past yr: 1 1 1   Injury with Fall? 1 1 1   Comment - - -  Risk Factor Category  Moderate Risk (1 Point) Moderate Risk (1 Point) Moderate Risk (1 Point)  Risk for fall due to : History of fall(s);Impaired balance/gait;Impaired mobility History of fall(s);Impaired balance/gait;Impaired mobility History of fall(s);Impaired balance/gait;Impaired mobility;Impaired vision  Follow up Falls evaluation completed;Falls prevention discussed Falls evaluation completed;Falls prevention discussed Education provided;Falls prevention discussed   PHQ 2/9 Scores 10/08/2019 06/04/2019 05/28/2019 02/26/2019 12/04/2018 05/29/2018 03/27/2018  PHQ - 2 Score 0 0 0 0 0 0 0   PHQ- 9 Score - - - - - - -    Assessment:  Goals Addressed            This Visit's Progress   . (THN)Make and Keep All Appointments       Follow Up Date 03/19/2020   - call to cancel if needed - keep a calendar with prescription refill dates - keep a calendar with appointment dates    Why is this important?   Part of staying healthy is seeing the doctor for follow-up care.  If you forget your appointments, there are some things you can do to stay on track.    Notes:     . (THN)Monitor and Manage My Blood Sugar       Follow Up Date [id number]   - check blood sugar at prescribed times - check blood sugar if I feel it is too high or too low - take the blood sugar log to all doctor visits - take the blood sugar meter to all doctor visits    Why is this important?   Checking your blood sugar at home helps to keep it from getting very high or very low.  Writing the results in a diary or log helps the doctor know how to care for you.  Your blood sugar log should have the time, date and the results.  Also, write down the amount of insulin or other medicine that you take.  Other information, like what you ate, exercise done and how you were feeling, will also be helpful.     Notes:     . [facility] Exam       Follow Up Date 03/19/2020   - keep appointment with eye doctor - schedule appointment with eye doctor    Why is this important?   Eye check-ups are important when you have diabetes.  Vision loss can be prevented.    Notes:  Patient has appointment scheduled for [id number]    . [facility] Foot Care       Follow Up Date [id number]   - check feet daily for cuts, sores or redness - keep feet up while sitting - trim toenails straight across - wash and dry feet carefully every day - wear comfortable, cotton socks - wear comfortable, well-fitting shoes    Why is this important?   Good foot care is very important when you have diabetes.  There are many things  you can do to keep your feet healthy and catch a problem early.    Notes:        Plan:  Patient will monitor blood sugar and document Patient will follow up with COVID booster Patient will follow up with mammogram RN will follow up within the month of December  [place] Management [phone]

## 2020-02-23 ENCOUNTER — Other Ambulatory Visit: Payer: Self-pay | Admitting: *Deleted

## 2020-02-23 NOTE — Patient Outreach (Signed)
[place] [facility]) Care Management  02/23/2020  [place] November 13, 1934 [id number]   RN Health Coach attempted follow up outreach call to patient.  Patient was unavailable. HIPPA compliance voicemail message left with return callback number.  Plan: RN will call patient again within 30 days.  [place] Care Management [phone]

## 2020-02-24 ENCOUNTER — Ambulatory Visit: Payer: Medicare Other

## 2020-02-27 ENCOUNTER — Encounter: Payer: Self-pay | Admitting: Oncology

## 2020-02-27 ENCOUNTER — Ambulatory Visit: Payer: Medicare Other | Attending: Internal Medicine

## 2020-02-27 ENCOUNTER — Other Ambulatory Visit: Payer: Self-pay | Admitting: Internal Medicine

## 2020-02-27 ENCOUNTER — Inpatient Hospital Stay: Payer: Medicare Other

## 2020-02-27 ENCOUNTER — Ambulatory Visit: Payer: Medicare Other | Admitting: Oncology

## 2020-02-27 ENCOUNTER — Inpatient Hospital Stay: Payer: Medicare Other | Attending: Oncology | Admitting: Oncology

## 2020-02-27 ENCOUNTER — Other Ambulatory Visit: Payer: Medicare Other

## 2020-02-27 VITALS — BP 137/56 | HR 76 | Temp 97.5°F | Resp 18 | Wt 178.0 lb

## 2020-02-27 DIAGNOSIS — D631 Anemia in chronic kidney disease: Secondary | ICD-10-CM | POA: Diagnosis not present

## 2020-02-27 DIAGNOSIS — D472 Monoclonal gammopathy: Secondary | ICD-10-CM | POA: Insufficient documentation

## 2020-02-27 DIAGNOSIS — I129 Hypertensive chronic kidney disease with stage 1 through stage 4 chronic kidney disease, or unspecified chronic kidney disease: Secondary | ICD-10-CM | POA: Diagnosis not present

## 2020-02-27 DIAGNOSIS — Z79899 Other long term (current) drug therapy: Secondary | ICD-10-CM | POA: Diagnosis not present

## 2020-02-27 DIAGNOSIS — Z23 Encounter for immunization: Secondary | ICD-10-CM

## 2020-02-27 DIAGNOSIS — E278 Other specified disorders of adrenal gland: Secondary | ICD-10-CM | POA: Insufficient documentation

## 2020-02-27 DIAGNOSIS — N183 Chronic kidney disease, stage 3 unspecified: Secondary | ICD-10-CM | POA: Diagnosis not present

## 2020-02-27 DIAGNOSIS — R918 Other nonspecific abnormal finding of lung field: Secondary | ICD-10-CM | POA: Insufficient documentation

## 2020-02-27 LAB — CBC WITH DIFFERENTIAL/PLATELET
Abs Immature Granulocytes: 0.03 10*3/uL (ref 0.00–0.07)
Basophils Absolute: 0 10*3/uL (ref 0.0–0.1)
Basophils Relative: 1 %
Eosinophils Absolute: 0.2 10*3/uL (ref 0.0–0.5)
Eosinophils Relative: 3 %
HCT: 33.6 % — ABNORMAL LOW (ref 36.0–46.0)
Hemoglobin: 10.8 g/dL — ABNORMAL LOW (ref 12.0–15.0)
Immature Granulocytes: 0 %
Lymphocytes Relative: 23 %
Lymphs Abs: 1.7 10*3/uL (ref 0.7–4.0)
MCH: 27.4 pg (ref 26.0–34.0)
MCHC: 32.1 g/dL (ref 30.0–36.0)
MCV: 85.3 fL (ref 80.0–100.0)
Monocytes Absolute: 0.6 10*3/uL (ref 0.1–1.0)
Monocytes Relative: 8 %
Neutro Abs: 4.7 10*3/uL (ref 1.7–7.7)
Neutrophils Relative %: 65 %
Platelets: 258 10*3/uL (ref 150–400)
RBC: 3.94 MIL/uL (ref 3.87–5.11)
RDW: 14.3 % (ref 11.5–15.5)
WBC: 7.3 10*3/uL (ref 4.0–10.5)
nRBC: 0 % (ref 0.0–0.2)

## 2020-02-27 LAB — BASIC METABOLIC PANEL
Anion gap: 10 (ref 5–15)
BUN: 26 mg/dL — ABNORMAL HIGH (ref 8–23)
CO2: 24 mmol/L (ref 22–32)
Calcium: 10.5 mg/dL — ABNORMAL HIGH (ref 8.9–10.3)
Chloride: 103 mmol/L (ref 98–111)
Creatinine, Ser: 1.44 mg/dL — ABNORMAL HIGH (ref 0.44–1.00)
GFR, Estimated: 36 mL/min — ABNORMAL LOW (ref >60)
Glucose, Bld: 152 mg/dL — ABNORMAL HIGH (ref 70–99)
Potassium: 4.3 mmol/L (ref 3.5–5.1)
Sodium: 137 mmol/L (ref 135–145)

## 2020-02-27 NOTE — Progress Notes (Signed)
[place]  [phone] [phone]  ID: SHERY WAUNEKA OB: June 08, 1934  MR#: [id number]  JEH#:[id number]  Patient Care Team: Baxter Hire, MD as PCP - General (Internal Medicine) [place], Eppie Gibson, RN as Center Line Management   CHIEF COMPLAINT: MGUS  INTERVAL HISTORY: Patient returns to clinic today for further evaluation and discussion of her laboratory results. Sherry [name] was last evaluated in our clinic on 11/28/19.  In the interim, she was evaluated by nephrology, Dr. Harriett Rush for stage 3 CKD. Renal ultrasound was ordered.  She has stable chronic BLE knee pain and left ankle swelling. Denies any new pains. She continues to feel well and remains asymptomatic. She has no neurologic complaints.  She has a good appetite and denies weight loss.  She has no chest pain, shortness of breath, cough, or hemoptysis.  She denies any nausea, vomiting, constipation, or diarrhea.  She has no urinary complaints.   REVIEW OF SYSTEMS:   Review of Systems  Constitutional: Negative.  Negative for fever, malaise/fatigue and weight loss.  Respiratory: Negative.  Negative for cough, hemoptysis and shortness of breath.   Cardiovascular: Positive for leg swelling. Negative for chest pain.  Gastrointestinal: Negative.  Negative for abdominal pain.  Genitourinary: Negative.  Negative for dysuria.  Musculoskeletal: Negative.  Negative for back pain.  Skin: Negative.  Negative for rash.  Neurological: Negative.  Negative for dizziness, focal weakness, weakness and headaches.  Psychiatric/Behavioral: Negative.  The patient is not nervous/anxious.     As per HPI. Otherwise, a complete review of systems is negative.  PAST MEDICAL HISTORY: Past Medical History:  Diagnosis Date  . Allergy   . Diabetes mellitus without complication ([place])   . Renal insufficiency   . Thyroid disease     PAST SURGICAL HISTORY: Past Surgical History:   Procedure Laterality Date  . CHOLECYSTECTOMY      FAMILY HISTORY: Family History  Problem Relation Age of Onset  . Diabetes Mother   . Hypertension Mother   . Hypertension Father   . Cancer Brother     ADVANCED DIRECTIVES (Y/N):  N  HEALTH MAINTENANCE: Social History   Tobacco Use  . Smoking status: Former Smoker    Types: Cigarettes    Quit date: 2004    Years since quitting: 17.9  . Smokeless tobacco: Never Used  Vaping Use  . Vaping Use: Never used  Substance Use Topics  . Alcohol use: Not Currently  . Drug use: Not Currently     Colonoscopy:  PAP:  Bone density:  Lipid panel:  Allergies  Allergen Reactions  . Penicillins Swelling and Rash    Has patient had a PCN reaction causing immediate rash, facial/tongue/throat swelling, SOB or lightheadedness with hypotension: No Has patient had a PCN reaction causing severe rash involving mucus membranes or skin necrosis: No Has patient had a PCN reaction that required hospitalization: No Has patient had a PCN reaction occurring within the last 10 years: No If all of the above answers are "NO", then may proceed with Cephalosporin use.    Current Outpatient Medications  Medication Sig Dispense Refill  . albuterol (VENTOLIN HFA) 108 (90 Base) MCG/ACT inhaler Inhale 2 puffs into the lungs every 6 (six) hours as needed.    Marland Kitchen atenolol (TENORMIN) 25 MG tablet Take 25 mg by mouth daily.    . Bromfenac Sodium (PROLENSA) 0.07 % SOLN Place 1 drop into both eyes daily.    . diclofenac Sodium (VOLTAREN) 1 % GEL  Apply 2 g topically as needed.    Marland Kitchen estradiol (ESTRACE) 0.5 MG tablet Take 0.5 mg by mouth daily.     . furosemide (LASIX) 40 MG tablet Take 20 mg by mouth daily as needed for fluid or edema (for ankle swelling).     . gabapentin (NEURONTIN) 400 MG capsule Take 400 mg by mouth daily.    . isosorbide mononitrate (IMDUR) 30 MG 24 hr tablet Take 30 mg by mouth daily.    Marland Kitchen levothyroxine (SYNTHROID, LEVOTHROID) 25 MCG tablet  TAKE 25 mcg TABLET EVERY DAY ON AN EMPTY STOMACH WITH A GLASS OF WATER AT LEAST 30 TO 60 MINUTES BEFORE BREAKFAST (Patient taking differently: Take 25 mcg by mouth daily before breakfast. )    . losartan (COZAAR) 50 MG tablet Take 50 mg by mouth daily.     . metFORMIN (GLUCOPHAGE) 500 MG tablet TAKE 500 mg TABLET TWICE DAILY WITH MEALS  Do not take on 1/10 or 03/30/17 due to contrast given for CT scan. Restart taking on 03/31/17. (Patient taking differently: Take 500 mg by mouth 2 (two) times daily with a meal. )    . montelukast (SINGULAIR) 10 MG tablet Take 10 mg by mouth daily.    Marland Kitchen omeprazole (PRILOSEC) 40 MG capsule Take 40 mg by mouth daily.    . progesterone (PROMETRIUM) 200 MG capsule Take 200 mg by mouth at bedtime.     . sucralfate (CARAFATE) 1 g tablet Take 1 g by mouth 4 (four) times daily -  with meals and at bedtime.    . traMADol (ULTRAM) 50 MG tablet Take 50 mg by mouth every 12 (twelve) hours as needed for moderate pain.      No current facility-administered medications for this visit.    OBJECTIVE: There were no vitals filed for this visit.   There is no height or weight on file to calculate BMI.    ECOG FS:0 - Asymptomatic  Physical Exam Constitutional:      General: Vital signs are normal.     Appearance: Normal appearance.     Comments: wheelchair  HENT:     Head: Normocephalic and atraumatic.  Eyes:     Pupils: Pupils are equal, round, and reactive to light.  Cardiovascular:     Rate and Rhythm: Normal rate and regular rhythm.     Heart sounds: Normal heart sounds. No murmur heard.   Pulmonary:     Effort: Pulmonary effort is normal.     Breath sounds: Normal breath sounds. No wheezing.  Abdominal:     General: Bowel sounds are normal. There is no distension.     Palpations: Abdomen is soft.     Tenderness: There is no abdominal tenderness.  Musculoskeletal:        General: Normal range of motion.     Cervical back: Normal range of motion.     Left lower  leg: Edema present.  Skin:    General: Skin is warm and dry.     Findings: No rash.  Neurological:     Mental Status: She is alert and oriented to person, place, and time.  Psychiatric:        Judgment: Judgment normal.        LAB RESULTS:  Lab Results  Component Value Date   NA 138 11/13/2019   K 4.4 11/13/2019   CL 105 11/13/2019   CO2 26 11/13/2019   GLUCOSE 120 (H) 11/13/2019   BUN 32 (H) 11/13/2019   CREATININE  1.50 (H) 12/18/2019   CALCIUM 10.3 11/13/2019   PROT 6.8 03/27/2017   ALBUMIN 3.5 03/27/2017   AST 16 03/27/2017   ALT 12 (L) 03/27/2017   ALKPHOS 53 03/27/2017   BILITOT 1.3 (H) 03/27/2017   GFRNONAA 37 (L) 11/13/2019   GFRAA 42 (L) 11/13/2019    Lab Results  Component Value Date   WBC 7.3 02/27/2020   NEUTROABS 4.7 02/27/2020   HGB 10.8 (L) 02/27/2020   HCT 33.6 (L) 02/27/2020   MCV 85.3 02/27/2020   PLT 258 02/27/2020     STUDIES: No results found.  ASSESSMENT: MGUS.  PLAN:    1  MGUS:  --Discovered in August 2021 incidentally by PCP while working up hypercalcemia --Labs from 11/13/19 showed an M spike of 0.4 with a mild elevation of her kappa free light chains.  -- Immunoglobulins were not drawn.  --Labs from today are pending.  -- Has CKD secondary to htn, type 2 dm-Cr 1.44, BUN 26 -- Anemia secondary to CKD-hemoglobin 10.8 -- Metastatic bone survey revealed some indeterminate lesions in her right clavicle and ribs.  --CT Chest on 12/18/19 showed no worrisome lytic leison.  --Incidental findings of two lung nodues and adrenal nodule. F/u in 12 month recommended.   2. Stage 3 CKD:  --Followed by nephrology --Secondary to htn, type 2 dm --Labs on 02/27/20 show cr 1.44, bun 25 --Stable  3. Anemia: --Secondary to CKD --Labs on 02/27/20 show hemoglobin 10.8. --If hemoglobin continues to trend down, would likely recommend initiating Erythropoietin stimulating agent such as retacrit  Disposition: --RTC in 3 months for repeat labs.    Greater than 50% was spent in counseling and coordination of care with this patient including but not limited to discussion of the relevant topics above (See A&P) including, but not limited to diagnosis and management of acute and chronic medical conditions.   Patient expressed understanding and was in agreement with this plan. She also understands that She can call clinic at any time with any questions, concerns, or complaints.   Cancer Staging No matching staging information was found for the patient.  Sherry Hawking, Sherry [name]   02/27/2020 1:02 PM

## 2020-02-27 NOTE — Progress Notes (Signed)
   Covid-19 Vaccination Clinic  Name:  Sherry [name]    MRN: [id number] DOB: 1934-11-24  02/27/2020  Sherry [name] was observed post Covid-19 immunization for 15 minutes without incident. She was provided with Vaccine Information Sheet and instruction to access the V-Safe system.   Sherry [name] was instructed to call 911 with any severe reactions post vaccine: Marland Kitchen Difficulty breathing  . Swelling of face and throat  . A fast heartbeat  . A bad rash all over body  . Dizziness and weakness   Immunizations Administered    Name Date Dose VIS Date Route   Pfizer COVID-19 Vaccine 02/27/2020 11:56 AM 0.3 mL 01/07/2020 Intramuscular   Manufacturer: [place]   Lot: [id number]   [place]: [address]-7

## 2020-02-28 LAB — IGG, IGA, IGM
IgA: 153 mg/dL (ref 64–422)
IgG (Immunoglobin G), Serum: 1094 mg/dL (ref 586–1602)
IgM (Immunoglobulin M), Srm: 71 mg/dL (ref 26–217)

## 2020-03-01 LAB — PROTEIN ELECTROPHORESIS, SERUM
A/G Ratio: 1.1 (ref 0.7–1.7)
Albumin ELP: 3.4 g/dL (ref 2.9–4.4)
Alpha-1-Globulin: 0.2 g/dL (ref 0.0–0.4)
Alpha-2-Globulin: 0.7 g/dL (ref 0.4–1.0)
Beta Globulin: 1 g/dL (ref 0.7–1.3)
Gamma Globulin: 1.1 g/dL (ref 0.4–1.8)
Globulin, Total: 3 g/dL (ref 2.2–3.9)
M-Spike, %: 0.5 g/dL — ABNORMAL HIGH
Total Protein ELP: 6.4 g/dL (ref 6.0–8.5)

## 2020-03-01 LAB — KAPPA/LAMBDA LIGHT CHAINS
Kappa free light chain: 37.4 mg/L — ABNORMAL HIGH (ref 3.3–19.4)
Kappa, lambda light chain ratio: 2.97 — ABNORMAL HIGH (ref 0.26–1.65)
Lambda free light chains: 12.6 mg/L (ref 5.7–26.3)

## 2020-03-09 ENCOUNTER — Ambulatory Visit: Payer: Medicare Other

## 2020-03-23 ENCOUNTER — Other Ambulatory Visit: Payer: Self-pay | Admitting: *Deleted

## 2020-03-23 NOTE — Patient Outreach (Signed)
[facility]) Care Management  03/23/2020  [place] [facility] 11-04-1934 [id number]  RN Health Coach attempted follow up outreach call to patient.  Patient was unavailable. Per voice mail problem with phone. Plan: RN will call patient again within 30 days.  Gean Maidens BSN RN [facility] Care Management [phone]

## 2020-03-26 ENCOUNTER — Other Ambulatory Visit: Payer: Self-pay | Admitting: Nurse Practitioner

## 2020-03-26 DIAGNOSIS — G8929 Other chronic pain: Secondary | ICD-10-CM

## 2020-03-26 DIAGNOSIS — M5416 Radiculopathy, lumbar region: Secondary | ICD-10-CM

## 2020-04-07 ENCOUNTER — Ambulatory Visit: Payer: Medicare Other

## 2020-04-08 ENCOUNTER — Ambulatory Visit
Admission: RE | Admit: 2020-04-08 | Discharge: 2020-04-08 | Disposition: A | Discharge status: Home / Self Care | Payer: Medicare Other | Source: Ambulatory Visit | Attending: Nurse Practitioner | Admitting: Nurse Practitioner

## 2020-04-08 ENCOUNTER — Other Ambulatory Visit: Payer: Self-pay

## 2020-04-08 DIAGNOSIS — M5442 Lumbago with sciatica, left side: Secondary | ICD-10-CM | POA: Insufficient documentation

## 2020-04-08 DIAGNOSIS — G8929 Other chronic pain: Secondary | ICD-10-CM | POA: Insufficient documentation

## 2020-04-08 DIAGNOSIS — M5416 Radiculopathy, lumbar region: Secondary | ICD-10-CM | POA: Diagnosis present

## 2020-04-20 ENCOUNTER — Other Ambulatory Visit: Payer: Self-pay | Admitting: *Deleted

## 2020-04-20 NOTE — Patient Instructions (Signed)
Goals Addressed            This Visit's Progress   . (THN)Make and Keep All Appointments   Not on track    Timeframe:  Long-Range Goal Priority:  Medium Start Date: 98338250           Expected End Date:    [id number]                  Follow Up Date [id number]   - call to cancel if needed - keep a calendar with prescription refill dates - keep a calendar with appointment dates    Why is this important?   Part of staying healthy is seeing the doctor for follow-up care.  If you forget your appointments, there are some things you can do to stay on track.    Notes:  Patient will reschedule eye exam due to snow    . (THN)Monitor and Manage My Blood Sugar   On track    Timeframe:  Short-Term Goal Priority:  High Start Date: [id number]                            Expected End Date:     [id number]                 Follow Up Date [id number] - check blood sugar at prescribed times - check blood sugar if I feel it is too high or too low - take the blood sugar log to all doctor visits - take the blood sugar meter to all doctor visits    Why is this important?   Checking your blood sugar at home helps to keep it from getting very high or very low.  Writing the results in a diary or log helps the doctor know how to care for you.  Your blood sugar log should have the time, date and the results.  Also, write down the amount of insulin or other medicine that you take.  Other information, like what you ate, exercise done and how you were feeling, will also be helpful.     Notes:     . [facility] Eye Exam       Timeframe:  Long-Range Goal Priority:  Medium Start Date: 98921194                            Expected End Date: 17408144                     Follow Up Date [id number]   - keep appointment with eye doctor - schedule appointment with eye doctor    Why is this important?   Eye check-ups are important when you have diabetes.  Vision loss can be prevented.    Notes:  Patient has  appointment scheduled for [id number] Patient will have to reschedule due to snow    . (THN)Perform Foot Care       Timeframe:  Long-Range Goal Priority:  Medium Start Date:       [id number]                      Expected End Date:  [id number]                     Follow Up Date [id number]  - check feet daily for cuts, sores or redness - keep feet up while  sitting - trim toenails straight across - wash and dry feet carefully every day - wear comfortable, cotton socks - wear comfortable, well-fitting shoes    Why is this important?   Good foot care is very important when you have diabetes.  There are many things you can do to keep your feet healthy and catch a problem early.    Notes:

## 2020-04-20 NOTE — Patient Outreach (Signed)
[name]) Care Management  [place]  04/20/2020   Sherry [name] Apr 22, 1934 [id number]  RN Health Coach telephone call to patient.  Hipaa compliance verified. Per patient she has not checked her blood sugar today but yesterday it was 114 fasting. Patient stated her appetite is fair. Per patient her daughter orders her groceries from [employer] and has them to deliver it. Patient has not had any recent falls. Per patient she had an Mri of back and has spinal stenosis. She will be starting physical therapy next week. Patient holds on to walker to ambulate. Patient does not currently drive and is scared to start driving again due to past history of dizziness. Patient has agreed to follow up outreach calls.  Encounter Medications:  Outpatient Encounter Medications as of 04/20/2020  Medication Sig  . ACCU-CHEK AVIVA PLUS test strip SMARTSIG:Via Meter  . albuterol (VENTOLIN HFA) 108 (90 Base) MCG/ACT inhaler Inhale 2 puffs into the lungs every 6 (six) hours as needed. (Patient not taking: Reported on 02/27/2020)  . atenolol (TENORMIN) 25 MG tablet Take 25 mg by mouth daily.  . Blood Glucose Calibration (ACCU-CHEK AVIVA) SOLN Use 1 each once daily  . Blood Glucose Monitoring Suppl (ACCU-CHEK AVIVA PLUS) w/Device KIT See admin instructions.  . Bromfenac Sodium 0.07 % SOLN Place 1 drop into both eyes daily. (Patient not taking: Reported on 02/27/2020)  . diclofenac Sodium (VOLTAREN) 1 % GEL Apply 2 g topically as needed. (Patient not taking: Reported on 02/27/2020)  . estradiol (ESTRACE) 0.5 MG tablet Take 0.5 mg by mouth daily.   . furosemide (LASIX) 40 MG tablet Take 20 mg by mouth daily as needed for fluid or edema (for ankle swelling).  (Patient not taking: Reported on 02/27/2020)  . gabapentin (NEURONTIN) 400 MG capsule Take 400 mg by mouth daily. (Patient not taking: Reported on 02/27/2020)  . levothyroxine (SYNTHROID, LEVOTHROID) 25 MCG tablet TAKE 25 mcg TABLET EVERY  DAY ON AN EMPTY STOMACH WITH A GLASS OF WATER AT LEAST 30 TO 60 MINUTES BEFORE BREAKFAST (Patient taking differently: Take 25 mcg by mouth daily before breakfast.)  . losartan (COZAAR) 50 MG tablet Take 50 mg by mouth daily.   . metFORMIN (GLUCOPHAGE) 500 MG tablet TAKE 500 mg TABLET TWICE DAILY WITH MEALS  Do not take on 1/10 or 03/30/17 due to contrast given for CT scan. Restart taking on 03/31/17. (Patient taking differently: Take 500 mg by mouth 2 (two) times daily with a meal.)  . montelukast (SINGULAIR) 10 MG tablet Take 10 mg by mouth daily.  Marland Kitchen omeprazole (PRILOSEC) 40 MG capsule Take 40 mg by mouth daily.  . progesterone (PROMETRIUM) 200 MG capsule Take 200 mg by mouth at bedtime.   . traMADol (ULTRAM) 50 MG tablet Take 50 mg by mouth every 12 (twelve) hours as needed for moderate pain.  (Patient not taking: Reported on 02/27/2020)   No facility-administered encounter medications on file as of 04/20/2020.    Functional Status:  In your present state of health, do you have any difficulty performing the following activities: 06/04/2019 05/28/2019  Hearing? N N  Vision? Y N  Comment Poor vision. -  Difficulty concentrating or making decisions? N N  Walking or climbing stairs? Y Y  Comment Unsteady gait, impaired balance. -  Dressing or bathing? N N  Doing errands, shopping? Y Y  Comment Unsteady gait, impaired balance. patient uses a Architectural technologist and eating ? N -  Using the Toilet? N -  In the past six months, have you accidently leaked urine? N -  Do you have problems with loss of bowel control? N -  Managing your Medications? N -  Managing your Finances? N -  Housekeeping or managing your Housekeeping? N -  Some recent data might be hidden    Fall/Depression Screening: Fall Risk  01/08/2020 10/08/2019 06/04/2019  Falls in the past year? '1 1 1  ' Comment - - -  Number falls in past yr: '1 1 1  ' Injury with Fall? '1 1 1  ' Comment - - -  Risk Factor Category  Moderate Risk (1  Point) Moderate Risk (1 Point) Moderate Risk (1 Point)  Risk for fall due to : History of fall(s);Impaired balance/gait;Impaired mobility History of fall(s);Impaired balance/gait;Impaired mobility History of fall(s);Impaired balance/gait;Impaired mobility;Impaired vision  Follow up Falls evaluation completed;Falls prevention discussed Falls evaluation completed;Falls prevention discussed Education provided;Falls prevention discussed   PHQ 2/9 Scores 10/08/2019 06/04/2019 05/28/2019 02/26/2019 12/04/2018 05/29/2018 03/27/2018  PHQ - 2 Score 0 0 0 0 0 0 0  PHQ- 9 Score - - - - - - -    Assessment:  Goals Addressed            This Visit's Progress   . (THN)Make and Keep All Appointments   Not on track    Timeframe:  Long-Range Goal Priority:  Medium Start Date: [id number]           Expected End Date:    28003491                  Follow Up Date 79150569   - call to cancel if needed - keep a calendar with prescription refill dates - keep a calendar with appointment dates    Why is this important?   Part of staying healthy is seeing the doctor for follow-up care.  If you forget your appointments, there are some things you can do to stay on track.    Notes:  Patient will reschedule eye exam due to snow    . (THN)Monitor and Manage My Blood Sugar   On track    Timeframe:  Short-Term Goal Priority:  High Start Date: 79480165                            Expected End Date:     53748270                 Follow Up Date 78675449 - check blood sugar at prescribed times - check blood sugar if I feel it is too high or too low - take the blood sugar log to all doctor visits - take the blood sugar meter to all doctor visits    Why is this important?   Checking your blood sugar at home helps to keep it from getting very high or very low.  Writing the results in a diary or log helps the doctor know how to care for you.  Your blood sugar log should have the time, date and the results.  Also, write  down the amount of insulin or other medicine that you take.  Other information, like what you ate, exercise done and how you were feeling, will also be helpful.     Notes:     . [facility] Eye Exam       Timeframe:  Long-Range Goal Priority:  Medium Start Date: 20100712  Expected End Date: 27782423                     Follow Up Date 53614431   - keep appointment with eye doctor - schedule appointment with eye doctor    Why is this important?   Eye check-ups are important when you have diabetes.  Vision loss can be prevented.    Notes:  Patient has appointment scheduled for [id number] Patient will have to reschedule due to snow    . (THN)Perform Foot Care       Timeframe:  Long-Range Goal Priority:  Medium Start Date:       19509326                      Expected End Date:  71245809                     Follow Up Date 98338250  - check feet daily for cuts, sores or redness - keep feet up while sitting - trim toenails straight across - wash and dry feet carefully every day - wear comfortable, cotton socks - wear comfortable, well-fitting shoes    Why is this important?   Good foot care is very important when you have diabetes.  There are many things you can do to keep your feet healthy and catch a problem early.    Notes:        Plan:  Follow-up:  Patient agrees to Care Plan and Follow-up. Patient will start physical therapy RN discussed healthy eating Patient will follow up with rescheduling eye exam RN will follow up with patient in February to see if started physical therapy RN assisted patient in ordering Covid testing [place] [facility] [phone]

## 2020-05-03 ENCOUNTER — Ambulatory Visit: Payer: Self-pay | Admitting: *Deleted

## 2020-05-10 ENCOUNTER — Other Ambulatory Visit: Payer: Self-pay | Admitting: *Deleted

## 2020-05-10 NOTE — Patient Outreach (Signed)
[place] [facility]) Care Management  05/10/2020  Sherry [name] [birth year] [id number]   RN Health Coach attempted follow up outreach call to patient.  Patient was unavailable. HIPPA compliance voicemail message left with return callback number.  Plan: RN will call patient again within 30 days.  [place] [facility] [phone]

## 2020-05-14 ENCOUNTER — Other Ambulatory Visit: Payer: Self-pay | Admitting: *Deleted

## 2020-05-14 NOTE — Patient Outreach (Signed)
  Sherry [name] [facility]) Care Management  05/14/2020  Sherry [name], Sherry [name] [id number]   RN Health Coach received return telephone call from patient.  Hipaa compliance verified.Per patient she has not started her physical therapy. Patient will follow up with neuro on physical therapy.   Plan: Follow up within the month of March  [name] [phone]

## 2020-06-01 ENCOUNTER — Other Ambulatory Visit: Payer: Medicare Other

## 2020-06-01 ENCOUNTER — Ambulatory Visit: Payer: Medicare Other | Admitting: Oncology

## 2020-06-04 NOTE — Progress Notes (Signed)
[place]  [phone]) [phone] [phone]) [phone]  ID: Sherry [name] OB: Nov 11, 1934  MR#: [id number]  QHU#:[id number]  Patient Care Team: Baxter Hire, MD as PCP - General (Internal Medicine) Pleasant, Eppie Gibson, RN as [facility] Grayland Ormond, Kathlene November, MD as Consulting Physician (Hematology and Oncology)   CHIEF COMPLAINT: MGUS  INTERVAL HISTORY: Patient returns to clinic today for routine 29-monthevaluation and discussion of her laboratory results.  She continues to feel well and remains asymptomatic.  She has no neurologic complaints.  She has chronic back pain.  She has a good appetite and denies weight loss.  She has no chest pain, shortness of breath, cough, or hemoptysis.  She denies any nausea, vomiting, constipation, or diarrhea.  She has no urinary complaints.  Patient offers no further specific complaints today.  REVIEW OF SYSTEMS:   Review of Systems  Constitutional: Negative.  Negative for fever, malaise/fatigue and weight loss.  Respiratory: Negative.  Negative for cough, hemoptysis and shortness of breath.   Cardiovascular: Negative.  Negative for chest pain and leg swelling.  Gastrointestinal: Negative.  Negative for abdominal pain.  Genitourinary: Negative.  Negative for dysuria.  Musculoskeletal: Positive for back pain.  Skin: Negative.  Negative for rash.  Neurological: Negative.  Negative for dizziness, focal weakness, weakness and headaches.  Psychiatric/Behavioral: Negative.  The patient is not nervous/anxious.     As per HPI. Otherwise, a complete review of systems is negative.  PAST MEDICAL HISTORY: Past Medical History:  Diagnosis Date  . Allergy   . Diabetes mellitus without complication ([place]   . Renal insufficiency   . Thyroid disease     PAST SURGICAL HISTORY: Past Surgical History:  Procedure Laterality Date  . CHOLECYSTECTOMY      FAMILY HISTORY: Family History  Problem Relation  Age of Onset  . Diabetes Mother   . Hypertension Mother   . Hypertension Father   . Cancer Brother     ADVANCED DIRECTIVES (Y/N):  N  HEALTH MAINTENANCE: Social History   Tobacco Use  . Smoking status: Former Smoker    Types: Cigarettes    Quit date: 2004    Years since quitting: 18.2  . Smokeless tobacco: Never Used  Vaping Use  . Vaping Use: Never used  Substance Use Topics  . Alcohol use: Not Currently  . Drug use: Not Currently     Colonoscopy:  PAP:  Bone density:  Lipid panel:  Allergies  Allergen Reactions  . Penicillins Swelling and Rash    Has patient had a PCN reaction causing immediate rash, facial/tongue/throat swelling, SOB or lightheadedness with hypotension: No Has patient had a PCN reaction causing severe rash involving mucus membranes or skin necrosis: No Has patient had a PCN reaction that required hospitalization: No Has patient had a PCN reaction occurring within the last 10 years: No If all of the above answers are "NO", then may proceed with Cephalosporin use.    Current Outpatient Medications  Medication Sig Dispense Refill  . atenolol (TENORMIN) 25 MG tablet Take 25 mg by mouth daily.    .Marland Kitchenestradiol (ESTRACE) 0.5 MG tablet Take 0.5 mg by mouth daily.     . furosemide (LASIX) 40 MG tablet Take 20 mg by mouth daily as needed for fluid or edema (for ankle swelling).    .Marland Kitchenlevothyroxine (SYNTHROID, LEVOTHROID) 25 MCG tablet TAKE 25 mcg TABLET EVERY DAY ON AN EMPTY STOMACH WITH A GLASS OF WATER AT LEAST 30 TO 60 MINUTES  BEFORE BREAKFAST (Patient taking differently: Take 25 mcg by mouth daily before breakfast.)    . metFORMIN (GLUCOPHAGE) 500 MG tablet TAKE 500 mg TABLET TWICE DAILY WITH MEALS  Do not take on 1/10 or 03/30/17 due to contrast given for CT scan. Restart taking on 03/31/17. (Patient taking differently: Take 500 mg by mouth 2 (two) times daily with a meal.)    . progesterone (PROMETRIUM) 200 MG capsule Take 200 mg by mouth at bedtime.      Marland Kitchen ACCU-CHEK AVIVA PLUS test strip SMARTSIG:Via Meter (Patient not taking: Reported on 06/08/2020)    . albuterol (VENTOLIN HFA) 108 (90 Base) MCG/ACT inhaler Inhale 2 puffs into the lungs every 6 (six) hours as needed. (Patient not taking: No sig reported)    . Blood Glucose Calibration (ACCU-CHEK AVIVA) SOLN Use 1 each once daily (Patient not taking: Reported on 06/08/2020)    . Blood Glucose Monitoring Suppl (ACCU-CHEK AVIVA PLUS) w/Device KIT See admin instructions. (Patient not taking: Reported on 06/08/2020)    . Bromfenac Sodium 0.07 % SOLN Place 1 drop into both eyes daily. (Patient not taking: No sig reported)    . diclofenac Sodium (VOLTAREN) 1 % GEL Apply 2 g topically as needed. (Patient not taking: No sig reported)    . gabapentin (NEURONTIN) 400 MG capsule Take 400 mg by mouth daily. (Patient not taking: No sig reported)    . losartan (COZAAR) 50 MG tablet Take 50 mg by mouth daily.  (Patient not taking: Reported on 06/08/2020)    . montelukast (SINGULAIR) 10 MG tablet Take 10 mg by mouth daily. (Patient not taking: Reported on 06/08/2020)    . omeprazole (PRILOSEC) 40 MG capsule Take 40 mg by mouth daily. (Patient not taking: Reported on 06/08/2020)    . traMADol (ULTRAM) 50 MG tablet Take 50 mg by mouth every 12 (twelve) hours as needed for moderate pain.  (Patient not taking: No sig reported)     No current facility-administered medications for this visit.    OBJECTIVE: Vitals:   06/08/20 1344  BP: (!) 147/81  Pulse: 87  Resp: 20  Temp: 98.4 F (36.9 C)     Body mass index is 28.89 kg/m.    ECOG FS:0 - Asymptomatic  General: Well-developed, well-nourished, no acute distress.  Sitting in a wheelchair. Eyes: Pink conjunctiva, anicteric sclera. HEENT: Normocephalic, moist mucous membranes. Lungs: No audible wheezing or coughing. Heart: Regular rate and rhythm. Abdomen: Soft, nontender, no obvious distention. Musculoskeletal: No edema, cyanosis, or clubbing. Neuro: Alert,  answering all questions appropriately. Cranial nerves grossly intact. Skin: No rashes or petechiae noted. Psych: Normal affect.   LAB RESULTS:  Lab Results  Component Value Date   NA 139 06/08/2020   K 3.8 06/08/2020   CL 106 06/08/2020   CO2 24 06/08/2020   GLUCOSE 158 (H) 06/08/2020   BUN 29 (H) 06/08/2020   CREATININE 1.25 (H) 06/08/2020   CALCIUM 10.7 (H) 06/08/2020   PROT 6.8 03/27/2017   ALBUMIN 3.5 03/27/2017   AST 16 03/27/2017   ALT 12 (L) 03/27/2017   ALKPHOS 53 03/27/2017   BILITOT 1.3 (H) 03/27/2017   GFRNONAA 43 (L) 06/08/2020   GFRAA 42 (L) 11/13/2019    Lab Results  Component Value Date   WBC 6.5 06/08/2020   NEUTROABS 4.1 06/08/2020   HGB 11.3 (L) 06/08/2020   HCT 34.7 (L) 06/08/2020   MCV 85.3 06/08/2020   PLT 258 06/08/2020     STUDIES: No results found.  ASSESSMENT: MGUS.  PLAN:    1.  Hypercalcemia: Mild.  Patient's most recent calcium levels are 10.7.  No intervention is needed, monitor. 2.  MGUS: Patient's most recent M spike is 0.4.  Immunoglobulins are within normal limits.  Kappa free light chains have trended up slightly to 37.4.  She has a mild renal insufficiency along with anemia which are both improved.  She also has a mild hypercalcemia of undetermined etiology.  Metastatic bone survey revealed some indeterminate lesions in her right clavicle and ribs, a follow-up CT scan revealed no significant pathology.  No intervention is needed.  Patient does not require bone marrow biopsy.  Return to clinic in 6 months with repeat laboratory work and further evaluation.  3.  Renal insufficiency: Chronic and unchanged.  Patient's creatinine is mildly improved.  Continue follow-up with nephrology as scheduled. 4.  Anemia: Hemoglobin improved to 11.3, monitor. 5.  Pulmonary nodule: Likely benign.  Can consider repeat chest CT in September 2022 to ensure stability.  Patient expressed understanding and was in agreement with this plan. She also  understands that She can call clinic at any time with any questions, concerns, or complaints.    Lloyd Huger, MD   06/08/2020 2:27 PM

## 2020-06-07 ENCOUNTER — Other Ambulatory Visit: Payer: Self-pay | Admitting: *Deleted

## 2020-06-08 ENCOUNTER — Encounter: Payer: Self-pay | Admitting: Oncology

## 2020-06-08 ENCOUNTER — Inpatient Hospital Stay: Payer: Medicare Other | Admitting: Oncology

## 2020-06-08 ENCOUNTER — Inpatient Hospital Stay: Payer: Medicare Other | Attending: Oncology

## 2020-06-08 VITALS — BP 147/81 | HR 87 | Temp 98.4°F | Resp 20 | Wt 179.0 lb

## 2020-06-08 DIAGNOSIS — E079 Disorder of thyroid, unspecified: Secondary | ICD-10-CM | POA: Insufficient documentation

## 2020-06-08 DIAGNOSIS — Z79899 Other long term (current) drug therapy: Secondary | ICD-10-CM | POA: Diagnosis not present

## 2020-06-08 DIAGNOSIS — E119 Type 2 diabetes mellitus without complications: Secondary | ICD-10-CM | POA: Insufficient documentation

## 2020-06-08 DIAGNOSIS — D472 Monoclonal gammopathy: Secondary | ICD-10-CM

## 2020-06-08 DIAGNOSIS — D649 Anemia, unspecified: Secondary | ICD-10-CM | POA: Diagnosis not present

## 2020-06-08 DIAGNOSIS — Z87891 Personal history of nicotine dependence: Secondary | ICD-10-CM | POA: Diagnosis not present

## 2020-06-08 DIAGNOSIS — Z7984 Long term (current) use of oral hypoglycemic drugs: Secondary | ICD-10-CM | POA: Diagnosis not present

## 2020-06-08 DIAGNOSIS — G8929 Other chronic pain: Secondary | ICD-10-CM | POA: Diagnosis not present

## 2020-06-08 DIAGNOSIS — Z9049 Acquired absence of other specified parts of digestive tract: Secondary | ICD-10-CM | POA: Diagnosis not present

## 2020-06-08 DIAGNOSIS — N289 Disorder of kidney and ureter, unspecified: Secondary | ICD-10-CM | POA: Diagnosis not present

## 2020-06-08 LAB — CBC WITH DIFFERENTIAL/PLATELET
Abs Immature Granulocytes: 0.03 10*3/uL (ref 0.00–0.07)
Basophils Absolute: 0.1 10*3/uL (ref 0.0–0.1)
Basophils Relative: 1 %
Eosinophils Absolute: 0.1 10*3/uL (ref 0.0–0.5)
Eosinophils Relative: 2 %
HCT: 34.7 % — ABNORMAL LOW (ref 36.0–46.0)
Hemoglobin: 11.3 g/dL — ABNORMAL LOW (ref 12.0–15.0)
Immature Granulocytes: 1 %
Lymphocytes Relative: 27 %
Lymphs Abs: 1.8 10*3/uL (ref 0.7–4.0)
MCH: 27.8 pg (ref 26.0–34.0)
MCHC: 32.6 g/dL (ref 30.0–36.0)
MCV: 85.3 fL (ref 80.0–100.0)
Monocytes Absolute: 0.5 10*3/uL (ref 0.1–1.0)
Monocytes Relative: 7 %
Neutro Abs: 4.1 10*3/uL (ref 1.7–7.7)
Neutrophils Relative %: 62 %
Platelets: 258 10*3/uL (ref 150–400)
RBC: 4.07 MIL/uL (ref 3.87–5.11)
RDW: 14.6 % (ref 11.5–15.5)
WBC: 6.5 10*3/uL (ref 4.0–10.5)
nRBC: 0 % (ref 0.0–0.2)

## 2020-06-08 LAB — BASIC METABOLIC PANEL
Anion gap: 9 (ref 5–15)
BUN: 29 mg/dL — ABNORMAL HIGH (ref 8–23)
CO2: 24 mmol/L (ref 22–32)
Calcium: 10.7 mg/dL — ABNORMAL HIGH (ref 8.9–10.3)
Chloride: 106 mmol/L (ref 98–111)
Creatinine, Ser: 1.25 mg/dL — ABNORMAL HIGH (ref 0.44–1.00)
GFR, Estimated: 43 mL/min — ABNORMAL LOW (ref >60)
Glucose, Bld: 158 mg/dL — ABNORMAL HIGH (ref 70–99)
Potassium: 3.8 mmol/L (ref 3.5–5.1)
Sodium: 139 mmol/L (ref 135–145)

## 2020-06-08 NOTE — Progress Notes (Signed)
Patient denies any concerns today.  

## 2020-06-09 LAB — KAPPA/LAMBDA LIGHT CHAINS
Kappa free light chain: 35.2 mg/L — ABNORMAL HIGH (ref 3.3–19.4)
Kappa, lambda light chain ratio: 3.03 — ABNORMAL HIGH (ref 0.26–1.65)
Lambda free light chains: 11.6 mg/L (ref 5.7–26.3)

## 2020-06-09 LAB — IGG, IGA, IGM
IgA: 165 mg/dL (ref 64–422)
IgG (Immunoglobin G), Serum: 1219 mg/dL (ref 586–1602)
IgM (Immunoglobulin M), Srm: 84 mg/dL (ref 26–217)

## 2020-06-10 LAB — PROTEIN ELECTROPHORESIS, SERUM
A/G Ratio: 1.3 (ref 0.7–1.7)
Albumin ELP: 3.7 g/dL (ref 2.9–4.4)
Alpha-1-Globulin: 0.2 g/dL (ref 0.0–0.4)
Alpha-2-Globulin: 0.8 g/dL (ref 0.4–1.0)
Beta Globulin: 0.9 g/dL (ref 0.7–1.3)
Gamma Globulin: 1 g/dL (ref 0.4–1.8)
Globulin, Total: 2.9 g/dL (ref 2.2–3.9)
M-Spike, %: 0.6 g/dL — ABNORMAL HIGH
Total Protein ELP: 6.6 g/dL (ref 6.0–8.5)

## 2020-06-15 NOTE — Patient Outreach (Addendum)
[facility]) Care Management  [place]  06/07/2020 Late entry  Sherry [name] 1935/01/23 [id number]  [place] telephone call to patient.  Hipaa compliance verified. Per patient her fasting blood sugar was 115. Last A1C 6.8.  Patient stated that she has been having knee and back pain. She is taking tylenol for discomfort. Patient is still waiting for Physical therapy. She has not had any recent falls. Patient has agreed to further outreach calls.   Encounter Medications:  Outpatient Encounter Medications as of 06/07/2020  Medication Sig  . ACCU-CHEK AVIVA PLUS test strip SMARTSIG:Via Meter (Patient not taking: Reported on 06/08/2020)  . albuterol (VENTOLIN HFA) 108 (90 Base) MCG/ACT inhaler Inhale 2 puffs into the lungs every 6 (six) hours as needed. (Patient not taking: No sig reported)  . atenolol (TENORMIN) 25 MG tablet Take 25 mg by mouth daily.  . Blood Glucose Calibration (ACCU-CHEK AVIVA) SOLN Use 1 each once daily (Patient not taking: Reported on 06/08/2020)  . Blood Glucose Monitoring Suppl (ACCU-CHEK AVIVA PLUS) w/Device KIT See admin instructions. (Patient not taking: Reported on 06/08/2020)  . Bromfenac Sodium 0.07 % SOLN Place 1 drop into both eyes daily. (Patient not taking: No sig reported)  . diclofenac Sodium (VOLTAREN) 1 % GEL Apply 2 g topically as needed. (Patient not taking: No sig reported)  . estradiol (ESTRACE) 0.5 MG tablet Take 0.5 mg by mouth daily.   . furosemide (LASIX) 40 MG tablet Take 20 mg by mouth daily as needed for fluid or edema (for ankle swelling).  . gabapentin (NEURONTIN) 400 MG capsule Take 400 mg by mouth daily. (Patient not taking: No sig reported)  . levothyroxine (SYNTHROID, LEVOTHROID) 25 MCG tablet TAKE 25 mcg TABLET EVERY DAY ON AN EMPTY STOMACH WITH A GLASS OF WATER AT LEAST 30 TO 60 MINUTES BEFORE BREAKFAST (Patient taking differently: Take 25 mcg by mouth daily before breakfast.)  . losartan (COZAAR) 50 MG  tablet Take 50 mg by mouth daily.  (Patient not taking: Reported on 06/08/2020)  . metFORMIN (GLUCOPHAGE) 500 MG tablet TAKE 500 mg TABLET TWICE DAILY WITH MEALS  Do not take on 1/10 or 03/30/17 due to contrast given for CT scan. Restart taking on 03/31/17. (Patient taking differently: Take 500 mg by mouth 2 (two) times daily with a meal.)  . montelukast (SINGULAIR) 10 MG tablet Take 10 mg by mouth daily. (Patient not taking: Reported on 06/08/2020)  . omeprazole (PRILOSEC) 40 MG capsule Take 40 mg by mouth daily. (Patient not taking: Reported on 06/08/2020)  . progesterone (PROMETRIUM) 200 MG capsule Take 200 mg by mouth at bedtime.   . traMADol (ULTRAM) 50 MG tablet Take 50 mg by mouth every 12 (twelve) hours as needed for moderate pain.  (Patient not taking: No sig reported)   No facility-administered encounter medications on file as of 06/07/2020.    Functional Status:  No flowsheet data found.  Fall/Depression Screening: Fall Risk  01/08/2020 10/08/2019 06/04/2019  Falls in the past year? '1 1 1  ' Comment - - -  Number falls in past yr: '1 1 1  ' Injury with Fall? '1 1 1  ' Comment - - -  Risk Factor Category  Moderate Risk (1 Point) Moderate Risk (1 Point) Moderate Risk (1 Point)  Risk for fall due to : History of fall(s);Impaired balance/gait;Impaired mobility History of fall(s);Impaired balance/gait;Impaired mobility History of fall(s);Impaired balance/gait;Impaired mobility;Impaired vision  Follow up Falls evaluation completed;Falls prevention discussed Falls evaluation completed;Falls prevention discussed Education provided;Falls prevention  discussed   PHQ 2/9 Scores 06/07/2020 10/08/2019 06/04/2019 05/28/2019 02/26/2019 12/04/2018 05/29/2018  PHQ - 2 Score 0 0 0 0 0 0 0  PHQ- 9 Score - - - - - - -    Assessment:  Goals Addressed            This Visit's Progress   . (THN)Make and Keep All Appointments   On track    Timeframe:  Long-Range Goal Priority:  Medium Start Date: 98338250            Expected End Date:    [id number]   Follow Up Date 19379024   - call to cancel if needed - keep a calendar with prescription refill dates - keep a calendar with appointment dates    Why is this important?   Part of staying healthy is seeing the doctor for follow-up care.  If you forget your appointments, there are some things you can do to stay on track.    Notes:  Patient will reschedule eye exam due to snow    . (THN)Monitor and Manage My Blood Sugar       Timeframe:  Short-Term Goal Priority:  High Start Date: 09735329                            Expected End Date:     92426834 Follow Up Date 19622297 - check blood sugar at prescribed times - check blood sugar if I feel it is too high or too low - take the blood sugar log to all doctor visits - take the blood sugar meter to all doctor visits    Why is this important?   Checking your blood sugar at home helps to keep it from getting very high or very low.  Writing the results in a diary or log helps the doctor know how to care for you.  Your blood sugar log should have the time, date and the results.  Also, write down the amount of insulin or other medicine that you take.  Other information, like what you ate, exercise done and how you were feeling, will also be helpful.     Notes:     . [facility] Eye Exam   Not on track    Timeframe:  Long-Range Goal Priority:  Medium Start Date: 98921194                            Expected End Date: 17408144           Follow Up Date 81856314   - keep appointment with eye doctor - schedule appointment with eye doctor    Why is this important?   Eye check-ups are important when you have diabetes.  Vision loss can be prevented.    Notes:  Patient has appointment scheduled for [id number] Patient will have to reschedule due to snow    . (THN)Perform Foot Care   On track    Timeframe:  Long-Range Goal Priority:  Medium Start Date:       58850277 Expected End Date:   41287867  Follow Up Date 67209470 - check feet daily for cuts, sores or redness - keep feet up while sitting - trim toenails straight across - wash and dry feet carefully every day - wear comfortable, cotton socks - wear comfortable, well-fitting shoes    Why is this important?   Good foot care is very important  when you have diabetes.  There are many things you can do to keep your feet healthy and catch a problem early.    Notes:        Plan:  Follow-up:  Patient agrees to Care Plan and Follow-up. Patient is awaiting physical therapy RN will follow up within the month of June RN sent update assessment to PCP  [place] Management [phone]

## 2020-06-15 NOTE — Patient Instructions (Signed)
Goals Addressed            This Visit's Progress   . (THN)Make and Keep All Appointments   On track    Timeframe:  Long-Range Goal Priority:  Medium Start Date: [id number]           Expected End Date:    12878676   Follow Up Date 72094709   - call to cancel if needed - keep a calendar with prescription refill dates - keep a calendar with appointment dates    Why is this important?   Part of staying healthy is seeing the doctor for follow-up care.  If you forget your appointments, there are some things you can do to stay on track.    Notes:  Patient will reschedule eye exam due to snow    . (THN)Monitor and Manage My Blood Sugar       Timeframe:  Short-Term Goal Priority:  High Start Date: [id number]                            Expected End Date:     47654650 Follow Up Date 35465681 - check blood sugar at prescribed times - check blood sugar if I feel it is too high or too low - take the blood sugar log to all doctor visits - take the blood sugar meter to all doctor visits    Why is this important?   Checking your blood sugar at home helps to keep it from getting very high or very low.  Writing the results in a diary or log helps the doctor know how to care for you.  Your blood sugar log should have the time, date and the results.  Also, write down the amount of insulin or other medicine that you take.  Other information, like what you ate, exercise done and how you were feeling, will also be helpful.     Notes:     . [facility] Eye Exam   Not on track    Timeframe:  Long-Range Goal Priority:  Medium Start Date: 27517001                            Expected End Date: 74944967           Follow Up Date 59163846   - keep appointment with eye doctor - schedule appointment with eye doctor    Why is this important?   Eye check-ups are important when you have diabetes.  Vision loss can be prevented.    Notes:  Patient has appointment scheduled for [id number] Patient will  have to reschedule due to snow    . (THN)Perform Foot Care   On track    Timeframe:  Long-Range Goal Priority:  Medium Start Date:       17793903 Expected End Date:  00923300  Follow Up Date 76226333 - check feet daily for cuts, sores or redness - keep feet up while sitting - trim toenails straight across - wash and dry feet carefully every day - wear comfortable, cotton socks - wear comfortable, well-fitting shoes    Why is this important?   Good foot care is very important when you have diabetes.  There are many things you can do to keep your feet healthy and catch a problem early.    Notes:

## 2020-09-07 ENCOUNTER — Other Ambulatory Visit: Payer: Self-pay | Admitting: *Deleted

## 2020-09-22 NOTE — Patient Instructions (Signed)
Goals Addressed             This Visit's Progress    (THN)Make and Keep All Appointments   Not on track    Timeframe:  Long-Range Goal Priority:  Medium Start Date: 99371696           Expected End Date:    78938101   Follow Up Date 75102585   - call to cancel if needed - keep a calendar with prescription refill dates - keep a calendar with appointment dates    Why is this important?   Part of staying healthy is seeing the doctor for follow-up care.  If you forget your appointments, there are some things you can do to stay on track.    Notes:  Patient will reschedule eye exam due to snow [id number] Patient will schedule next eye exam      (THN)Monitor and Manage My Blood Sugar   On track    Timeframe:  Short-Term Goal Priority:  High Start Date: 53614431                            Expected End Date:     54008676 Follow Up Date 19509326 - check blood sugar at prescribed times - check blood sugar if I feel it is too high or too low - take the blood sugar log to all doctor visits - take the blood sugar meter to all doctor visits    Why is this important?   Checking your blood sugar at home helps to keep it from getting very high or very low.  Writing the results in a diary or log helps the doctor know how to care for you.  Your blood sugar log should have the time, date and the results.  Also, write down the amount of insulin or other medicine that you take.  Other information, like what you ate, exercise done and how you were feeling, will also be helpful.     Notes:  [id number] Patient is monitoring blood sugars. Fasting blood sugar is 110      [facility] Eye Exam   Not on track    Timeframe:  Long-Range Goal Priority:  Medium Start Date: 98338250                            Expected End Date: 53976734           Follow Up Date 19379024   - keep appointment with eye doctor - schedule appointment with eye doctor    Why is this important?   Eye check-ups are  important when you have diabetes.  Vision loss can be prevented.    Notes:  Patient has appointment scheduled for 09735329 Patient will have to reschedule due to snow [id number] RN will schedule an eye exam      (THN)Perform Foot Care   Not on track    Timeframe:  Long-Range Goal Priority:  Medium Start Date:       19622297 Expected End Date:  98921194  Follow Up Date 17408144 - check feet daily for cuts, sores or redness - keep feet up while sitting - trim toenails straight across - wash and dry feet carefully every day - wear comfortable, cotton socks - wear comfortable, well-fitting shoes    Why is this important?   Good foot care is very important when you have diabetes.  There are many things you  can do to keep your feet healthy and catch a problem early.    Notes:  [id number] patient is encouraged to follow up on diabetic shoes

## 2020-09-22 NOTE — Patient Outreach (Addendum)
[name]) Care Management  [facility] Care Manager  [id number] Late entry   SHARMAYNE JABLON Sep 15, 1934 [id number]  RN Health Coach telephone call to patient.  H[employer] compliance verified. Per patient her fasting blood sugar is 110. She has not had any falls recently. She is using her walker when ambulating. She is doing chair exercises at home. Per patient she has not scheduled her eye exam. Patient and daughter orders her groceries for delivery. Patient is under stress. Her brother is dying and his girlfriend died. Patient has agreed to follow up outreach.  Encounter Medications:  Outpatient Encounter Medications as of 09/07/2020  Medication Sig   ACCU-CHEK AVIVA PLUS test strip    albuterol (VENTOLIN HFA) 108 (90 Base) MCG/ACT inhaler Inhale 2 puffs into the lungs every 6 (six) hours as needed.   atenolol (TENORMIN) 25 MG tablet Take 25 mg by mouth daily.   Blood Glucose Calibration (ACCU-CHEK AVIVA) SOLN    Blood Glucose Monitoring Suppl (ACCU-CHEK AVIVA PLUS) w/Device KIT See admin instructions.   diclofenac Sodium (VOLTAREN) 1 % GEL Apply 2 g topically as needed.   estradiol (ESTRACE) 0.5 MG tablet Take 0.5 mg by mouth daily.    furosemide (LASIX) 40 MG tablet Take 20 mg by mouth daily as needed for fluid or edema (for ankle swelling).   levothyroxine (SYNTHROID, LEVOTHROID) 25 MCG tablet TAKE 25 mcg TABLET EVERY DAY ON AN EMPTY STOMACH WITH A GLASS OF WATER AT LEAST 30 TO 60 MINUTES BEFORE BREAKFAST (Patient taking differently: Take 25 mcg by mouth daily before breakfast.)   losartan (COZAAR) 50 MG tablet Take 50 mg by mouth daily.   metFORMIN (GLUCOPHAGE) 500 MG tablet TAKE 500 mg TABLET TWICE DAILY WITH MEALS  Do not take on 1/10 or 03/30/17 due to contrast given for CT scan. Restart taking on 03/31/17. (Patient taking differently: Take 500 mg by mouth 2 (two) times daily with a meal.)   omeprazole (PRILOSEC) 40 MG capsule Take 40 mg by mouth daily.   progesterone  (PROMETRIUM) 200 MG capsule Take 200 mg by mouth at bedtime.    Bromfenac Sodium 0.07 % SOLN Place 1 drop into both eyes daily. (Patient not taking: No sig reported)   COVID-19 mRNA vaccine, Pfizer, 30 MCG/0.3ML injection USE AS DIRECTED   gabapentin (NEURONTIN) 400 MG capsule Take 400 mg by mouth daily. (Patient not taking: No sig reported)   montelukast (SINGULAIR) 10 MG tablet Take 10 mg by mouth daily. (Patient not taking: No sig reported)   traMADol (ULTRAM) 50 MG tablet Take 50 mg by mouth every 12 (twelve) hours as needed for moderate pain.  (Patient not taking: No sig reported)   No facility-administered encounter medications on file as of 09/07/2020.    Functional Status:  No flowsheet data found.  Fall/Depression Screening: Fall Risk  09/07/2020 01/08/2020 10/08/2019  Falls in the past year? '1 1 1  ' Comment - - -  Number falls in past yr: '1 1 1  ' Injury with Fall? '1 1 1  ' Comment - - -  Risk Factor Category  Moderate Risk (1 Point) Moderate Risk (1 Point) Moderate Risk (1 Point)  Risk for fall due to : History of fall(s);Impaired balance/gait;Impaired mobility;Impaired vision History of fall(s);Impaired balance/gait;Impaired mobility History of fall(s);Impaired balance/gait;Impaired mobility  Follow up Falls evaluation completed Falls evaluation completed;Falls prevention discussed Falls evaluation completed;Falls prevention discussed   PHQ 2/9 Scores 06/07/2020 10/08/2019 06/04/2019 05/28/2019 02/26/2019 12/04/2018 05/29/2018  PHQ - 2 Score 0 0 0 0  0 0 0  PHQ- 9 Score - - - - - - -    Assessment:   Care Plan Care Plan : General Plan of Care (Adult)  Updates made by Kennadie Brenner, Eppie Gibson, RN since 09/22/2020 12:00 AM     Problem: Therapeutic Alliance (General Plan of Care)   Priority: Medium  Onset Date: 03/09/2020     Long-Range Goal: Therapeutic Alliance Established   Start Date: 01/08/2020  Expected End Date: 03/18/2021  This Visit's Progress: On track  Recent Progress: On  track  Priority: Medium  Note:   Evidence-based guidance:  Avoid value judgments; convey acceptance.  Encourage collaboration with the treatment team.  Establish rapport; develop trust relationship.  Therapist, art.  Provide emotional support; encourage patient to share feelings of anger, fear and anxiety.  Promote self-reliance and autonomy based on age and ability; discourage overprotection.  Use empathy and nonjudgmental, participatory manner.   Notes:     Task: Develop Relationship to Effect Behavior Change   Due Date: 03/18/2021  Note:   Care Management Activities:    - acceptance conveyed - care explained - choices provided - emotional support provided - questions answered - questions encouraged - rapport fostered - reassurance provided - verbalization of feelings encouraged    Notes:  [id number] Patient is encouraged to ask question.     Problem: Health Promotion or Disease Self-Management (General Plan of Care)   Priority: High  Onset Date: 01/08/2020     Long-Range Goal: Self-Management Plan Developed   Start Date: 01/08/2020  Expected End Date: 03/18/2021  This Visit's Progress: On track  Recent Progress: On track  Priority: Medium  Note:   Evidence-based guidance:  Review biopsychosocial determinants of health screens.  Determine level of modifiable health risk.  Assess level of patient activation, level of readiness, importance and confidence to make changes.  Evoke change talk using open-ended questions, pros and cons, as well as looking forward.  Identify areas where behavior change may lead to improved health.  Partner with patient to develop a robust self-management plan that includes lifestyle factors, such as weight loss, exercise and healthy nutrition, as well as goals specific to disease risks.  Support patient and family/caregiver active participation in decision-making and self-management plan.  Implement additional goals and  interventions based on identified risk factors to reduce health risk.  Facilitate advance care planning.  Review need for preventive screening based on age, sex, family history and health history.   Notes:  Patient is looking at how physical therapy can help her Patient is deciding if she will drive again [id number] Patient will schedule eye exam  [id number] Patient will     Task: Mutually Develop and Royce Macadamia Achievement of Patient Goals   Due Date: 03/18/2021  Note:   Care Management Activities:    - decision-making supported - health risks reviewed - questions answered - reassurance provided - verbalization of feelings encouraged    Notes:  [id number] Patient understands the importance of health maintenance.    Care Plan : Diabetes Type 2 (Adult)  Updates made by Verlin Grills, RN since 09/22/2020 12:00 AM     Problem: Glycemic Management (Diabetes, Type 2)   Priority: High  Onset Date: 01/08/2020     Long-Range Goal: Glycemic Management Optimized   Start Date: 01/08/2020  Expected End Date: 03/18/2021  This Visit's Progress: On track  Recent Progress: On track  Priority: High  Note:   Evidence-based guidance:  Anticipate A1C testing (point-of-care) every 3 to 6  months based on goal attainment.  Review mutually-set A1C goal or target range.  Anticipate use of antihyperglycemic with or without insulin and periodic adjustments; consider active involvement of pharmacist.  Provide medical nutrition therapy and development of individualized eating.  Compare self-reported symptoms of hypo or hyperglycemia to blood glucose levels, diet and fluid intake, current medications, psychosocial and physiologic stressors, change in activity and barriers to care adherence.  Promote self-monitoring of blood glucose levels.  Assess and address barriers to management plan, such as food insecurity, age, developmental ability, depression, anxiety, fear of hypoglycemia or weight gain, as well  as medication cost, side effects and complicated regimen.  Consider referral to community-based diabetes education program, visiting nurse, community health worker or health coach.  Encourage regular dental care for treatment of periodontal disease; refer to dental provider when needed.   Notes:  Patient will reschedule eye exam [id number] Patient will reschedule eye exam    Problem: Disease Progression (Diabetes, Type 2)   Priority: Medium  Onset Date: 01/08/2020     Long-Range Goal: Disease Progression Prevented or Minimized   Start Date: 01/08/2020  Expected End Date: 03/18/2021  This Visit's Progress: On track  Recent Progress: On track  Priority: Medium  Note:   Evidence-based guidance:  Prepare patient for laboratory and diagnostic exams based on risk and presentation.  Encourage lifestyle changes, such as increased intake of plant-based foods, stress reduction, consistent physical activity and smoking cessation to prevent long-term complications and chronic disease.   Individualize activity and exercise recommendations while considering potential limitations, such as neuropathy, retinopathy or the ability to prevent hyperglycemia or hypoglycemia.   Prepare patient for use of pharmacologic therapy that may include antihypertensive, analgesic, prostaglandin E1 with periodic adjustments, based on presenting chronic condition and laboratory results.  Assess signs/symptoms and risk factors for hypertension, sleep-disordered breathing, neuropathy (including changes in gait and balance), retinopathy, nephropathy and sexual dysfunction.  Address pregnancy planning and contraceptive choice, especially when prescribing antihypertensive or statin.  Ensure completion of annual comprehensive foot exam and dilated eye exam.   Implement additional individualized goals and interventions based on identified risk factors.  Prepare patient for consultation or referral for specialist care, such as  ophthalmology, neurology, cardiology, podiatry, nephrology or perinatology.   Notes:       Goals Addressed             This Visit's Progress    (THN)Make and Keep All Appointments   Not on track    Timeframe:  Long-Range Goal Priority:  Medium Start Date: 38333832           Expected End Date:    91916606   Follow Up Date 00459977   - call to cancel if needed - keep a calendar with prescription refill dates - keep a calendar with appointment dates    Why is this important?   Part of staying healthy is seeing the doctor for follow-up care.  If you forget your appointments, there are some things you can do to stay on track.    Notes:  Patient will reschedule eye exam due to snow [id number] Patient will schedule next eye exam      (THN)Monitor and Manage My Blood Sugar   On track    Timeframe:  Short-Term Goal Priority:  High Start Date: 20233435                            Expected End Date:  33354562 Follow Up Date 56389373 - check blood sugar at prescribed times - check blood sugar if I feel it is too high or too low - take the blood sugar log to all doctor visits - take the blood sugar meter to all doctor visits    Why is this important?   Checking your blood sugar at home helps to keep it from getting very high or very low.  Writing the results in a diary or log helps the doctor know how to care for you.  Your blood sugar log should have the time, date and the results.  Also, write down the amount of insulin or other medicine that you take.  Other information, like what you ate, exercise done and how you were feeling, will also be helpful.     Notes:  [id number] Patient is monitoring blood sugars. Fasting blood sugar is 110      [facility] Eye Exam   Not on track    Timeframe:  Long-Range Goal Priority:  Medium Start Date: 57262035                            Expected End Date: 59741638           Follow Up Date 45364680   - keep appointment with eye  doctor - schedule appointment with eye doctor    Why is this important?   Eye check-ups are important when you have diabetes.  Vision loss can be prevented.    Notes:  Patient has appointment scheduled for [id number] Patient will have to reschedule due to snow [id number] RN will schedule an eye exam      (THN)Perform Foot Care   Not on track    Timeframe:  Long-Range Goal Priority:  Medium Start Date:       88916945 Expected End Date:  03888280  Follow Up Date 03491791 - check feet daily for cuts, sores or redness - keep feet up while sitting - trim toenails straight across - wash and dry feet carefully every day - wear comfortable, cotton socks - wear comfortable, well-fitting shoes    Why is this important?   Good foot care is very important when you have diabetes.  There are many things you can do to keep your feet healthy and catch a problem early.    Notes:  [id number] patient is encouraged to follow up on diabetic shoes         Plan:  Follow-up: Patient agrees to Care Plan and Follow-up. Patient will schedule eye exam RN ordered free COVID tests for patient RN will follow up within the month of October RN sent update assessment to PCP  [facility] [phone]

## 2020-12-09 ENCOUNTER — Inpatient Hospital Stay: Payer: Medicare Other

## 2020-12-13 ENCOUNTER — Other Ambulatory Visit: Payer: Self-pay

## 2020-12-13 DIAGNOSIS — D472 Monoclonal gammopathy: Secondary | ICD-10-CM

## 2020-12-14 ENCOUNTER — Inpatient Hospital Stay: Payer: Medicare Other | Attending: Oncology

## 2020-12-16 ENCOUNTER — Other Ambulatory Visit: Payer: Self-pay

## 2020-12-17 ENCOUNTER — Inpatient Hospital Stay: Payer: Medicare Other | Admitting: Oncology

## 2020-12-17 ENCOUNTER — Other Ambulatory Visit: Payer: Self-pay | Admitting: *Deleted

## 2020-12-17 NOTE — Patient Outreach (Signed)
[facility]) Care Management  12/17/2020  SHARIECE VIVEIROS 18-Jun-1934 [id number]   RN Health Coach attempted follow up outreach call to patient.  Patient was unavailable. HIPPA compliance voicemail message left with return callback number.  Plan: RN will call patient again within 30 days.  [facility] Management [phone]

## 2020-12-21 ENCOUNTER — Inpatient Hospital Stay: Payer: Medicare Other

## 2020-12-21 ENCOUNTER — Encounter: Payer: Self-pay | Admitting: Oncology

## 2020-12-21 ENCOUNTER — Inpatient Hospital Stay: Payer: Medicare Other | Attending: Oncology | Admitting: Oncology

## 2020-12-21 VITALS — BP 139/56 | HR 67 | Temp 97.8°F | Resp 18 | Wt 182.5 lb

## 2020-12-21 DIAGNOSIS — D472 Monoclonal gammopathy: Secondary | ICD-10-CM

## 2020-12-21 DIAGNOSIS — I129 Hypertensive chronic kidney disease with stage 1 through stage 4 chronic kidney disease, or unspecified chronic kidney disease: Secondary | ICD-10-CM | POA: Insufficient documentation

## 2020-12-21 DIAGNOSIS — N183 Chronic kidney disease, stage 3 unspecified: Secondary | ICD-10-CM | POA: Insufficient documentation

## 2020-12-21 DIAGNOSIS — M898X9 Other specified disorders of bone, unspecified site: Secondary | ICD-10-CM

## 2020-12-21 DIAGNOSIS — D631 Anemia in chronic kidney disease: Secondary | ICD-10-CM | POA: Diagnosis not present

## 2020-12-21 LAB — BASIC METABOLIC PANEL
Anion gap: 8 (ref 5–15)
BUN: 33 mg/dL — ABNORMAL HIGH (ref 8–23)
CO2: 25 mmol/L (ref 22–32)
Calcium: 10.5 mg/dL — ABNORMAL HIGH (ref 8.9–10.3)
Chloride: 103 mmol/L (ref 98–111)
Creatinine, Ser: 1.43 mg/dL — ABNORMAL HIGH (ref 0.44–1.00)
GFR, Estimated: 36 mL/min — ABNORMAL LOW (ref >60)
Glucose, Bld: 175 mg/dL — ABNORMAL HIGH (ref 70–99)
Potassium: 4 mmol/L (ref 3.5–5.1)
Sodium: 136 mmol/L (ref 135–145)

## 2020-12-21 LAB — CBC WITH DIFFERENTIAL/PLATELET
Abs Immature Granulocytes: 0.04 10*3/uL (ref 0.00–0.07)
Basophils Absolute: 0 10*3/uL (ref 0.0–0.1)
Basophils Relative: 1 %
Eosinophils Absolute: 0.3 10*3/uL (ref 0.0–0.5)
Eosinophils Relative: 4 %
HCT: 35.5 % — ABNORMAL LOW (ref 36.0–46.0)
Hemoglobin: 11.1 g/dL — ABNORMAL LOW (ref 12.0–15.0)
Immature Granulocytes: 1 %
Lymphocytes Relative: 23 %
Lymphs Abs: 1.9 10*3/uL (ref 0.7–4.0)
MCH: 27.2 pg (ref 26.0–34.0)
MCHC: 31.3 g/dL (ref 30.0–36.0)
MCV: 87 fL (ref 80.0–100.0)
Monocytes Absolute: 0.6 10*3/uL (ref 0.1–1.0)
Monocytes Relative: 7 %
Neutro Abs: 5.5 10*3/uL (ref 1.7–7.7)
Neutrophils Relative %: 64 %
Platelets: 316 10*3/uL (ref 150–400)
RBC: 4.08 MIL/uL (ref 3.87–5.11)
RDW: 14.6 % (ref 11.5–15.5)
WBC: 8.4 10*3/uL (ref 4.0–10.5)
nRBC: 0 % (ref 0.0–0.2)

## 2020-12-21 NOTE — Progress Notes (Signed)
[place]  [phone] [phone]  ID: Sherry [name] OB: 11/05/1934  MR#: [id number]  QMG#:[id number]  Patient Care Team: Baxter Hire, MD as PCP - General (Internal Medicine) Ple[place], Eppie Gibson, RN as [place] [facility] Grayland Ormond, Kathlene November, MD as Consulting Physician (Hematology and Oncology)   CHIEF COMPLAINT: MGUS  INTERVAL HISTORY: Sherry [name] is an 86 year old female with past medical history significant for hypertension, GERD, diabetes with stage III CKD, hypothyroidism, cervical and lumbar radiculitis, DDD who is followed by Dr. Grayland Ormond for MGUS.  She denies any interval hospitalizations.  She has been seen on several occasions for lower back pain and has received injections last on 09/13/2020.  She was treated for UTI and vaginal irritation on 10/07/2020.  Patient reports back and neck issues since her fall approximately 1 year ago.  She reports worsening upper neck that extends anteriorly.  States this is worsening and impairing her ability for everyday activities such as turning her head from side to side.  Denies any subsequent falls or injuries.  She uses a walker or cane at home for ambulation due to fear of falling.  remains asymptomatic.  REVIEW OF SYSTEMS:   Review of Systems  Constitutional: Negative.  Negative for chills, fever, malaise/fatigue and weight loss.  HENT:  Negative for congestion, ear pain and tinnitus.   Eyes: Negative.  Negative for blurred vision and double vision.  Respiratory: Negative.  Negative for cough, sputum production and shortness of breath.   Cardiovascular: Negative.  Negative for chest pain, palpitations and leg swelling.  Gastrointestinal: Negative.  Negative for abdominal pain, constipation, diarrhea, nausea and vomiting.  Genitourinary:  Negative for dysuria, frequency and urgency.  Musculoskeletal:  Positive for back pain, myalgias and neck pain. Negative for  falls.  Skin: Negative.  Negative for rash.  Neurological: Negative.  Negative for weakness and headaches.  Endo/Heme/Allergies: Negative.  Does not bruise/bleed easily.  Psychiatric/Behavioral: Negative.  Negative for depression. The patient is not nervous/anxious and does not have insomnia.    As per HPI. Otherwise, a complete review of systems is negative.  PAST MEDICAL HISTORY: Past Medical History:  Diagnosis Date   Allergy    Diabetes mellitus without complication ([place])    Renal insufficiency    Thyroid disease     PAST SURGICAL HISTORY: Past Surgical History:  Procedure Laterality Date   CHOLECYSTECTOMY      FAMILY HISTORY: Family History  Problem Relation Age of Onset   Diabetes Mother    Hypertension Mother    Hypertension Father    Cancer Brother     ADVANCED DIRECTIVES (Y/N):  N  HEALTH MAINTENANCE: Social History   Tobacco Use   Smoking status: Former    Types: Cigarettes    Quit date: 2004    Years since quitting: 18.7   Smokeless tobacco: Never  Vaping Use   Vaping Use: Never used  Substance Use Topics   Alcohol use: Not Currently   Drug use: Not Currently     Colonoscopy:  PAP:  Bone density:  Lipid panel:  Allergies  Allergen Reactions   Penicillins Swelling and Rash    Has patient had a PCN reaction causing immediate rash, facial/tongue/throat swelling, SOB or lightheadedness with hypotension: No Has patient had a PCN reaction causing severe rash involving mucus membranes or skin necrosis: No Has patient had a PCN reaction that required hospitalization: No Has patient had a PCN reaction occurring within the last 10 years: No If  all of the above answers are "NO", then may proceed with Cephalosporin use.    Current Outpatient Medications  Medication Sig Dispense Refill   ACCU-CHEK AVIVA PLUS test strip      albuterol (VENTOLIN HFA) 108 (90 Base) MCG/ACT inhaler Inhale 2 puffs into the lungs every 6 (six) hours as needed.     atenolol  (TENORMIN) 25 MG tablet Take 25 mg by mouth daily.     Blood Glucose Calibration (ACCU-CHEK AVIVA) SOLN      Blood Glucose Monitoring Suppl (ACCU-CHEK AVIVA PLUS) w/Device KIT See admin instructions.     Bromfenac Sodium 0.07 % SOLN Place 1 drop into both eyes daily. (Patient not taking: No sig reported)     COVID-19 mRNA vaccine, Pfizer, 30 MCG/0.3ML injection USE AS DIRECTED .3 mL 0   diclofenac Sodium (VOLTAREN) 1 % GEL Apply 2 g topically as needed.     estradiol (ESTRACE) 0.5 MG tablet Take 0.5 mg by mouth daily.      furosemide (LASIX) 40 MG tablet Take 20 mg by mouth daily as needed for fluid or edema (for ankle swelling).     gabapentin (NEURONTIN) 400 MG capsule Take 400 mg by mouth daily. (Patient not taking: No sig reported)     levothyroxine (SYNTHROID, LEVOTHROID) 25 MCG tablet TAKE 25 mcg TABLET EVERY DAY ON AN EMPTY STOMACH WITH A GLASS OF WATER AT LEAST 30 TO 60 MINUTES BEFORE BREAKFAST (Patient taking differently: Take 25 mcg by mouth daily before breakfast.)     losartan (COZAAR) 50 MG tablet Take 50 mg by mouth daily.     metFORMIN (GLUCOPHAGE) 500 MG tablet TAKE 500 mg TABLET TWICE DAILY WITH MEALS  Do not take on 1/10 or 03/30/17 due to contrast given for CT scan. Restart taking on 03/31/17. (Patient taking differently: Take 500 mg by mouth 2 (two) times daily with a meal.)     montelukast (SINGULAIR) 10 MG tablet Take 10 mg by mouth daily. (Patient not taking: No sig reported)     omeprazole (PRILOSEC) 40 MG capsule Take 40 mg by mouth daily.     progesterone (PROMETRIUM) 200 MG capsule Take 200 mg by mouth at bedtime.      traMADol (ULTRAM) 50 MG tablet Take 50 mg by mouth every 12 (twelve) hours as needed for moderate pain.  (Patient not taking: No sig reported)     No current facility-administered medications for this visit.    OBJECTIVE: There were no vitals filed for this visit.   There is no height or weight on file to calculate BMI.    ECOG FS:0 -  Asymptomatic  Physical Exam Constitutional:      Appearance: Normal appearance.  HENT:     Head: Normocephalic and atraumatic.  Eyes:     Pupils: Pupils are equal, round, and reactive to light.  Cardiovascular:     Rate and Rhythm: Normal rate and regular rhythm.     Heart sounds: Normal heart sounds. No murmur heard. Pulmonary:     Effort: Pulmonary effort is normal.     Breath sounds: Normal breath sounds. No wheezing.  Abdominal:     General: Bowel sounds are normal. There is no distension.     Palpations: Abdomen is soft.     Tenderness: There is no abdominal tenderness.  Musculoskeletal:     Cervical back: Rigidity present. No signs of trauma. Decreased range of motion.  Skin:    General: Skin is warm and dry.  Findings: No rash.  Neurological:     Mental Status: She is alert and oriented to person, place, and time.  Psychiatric:        Judgment: Judgment normal.     LAB RESULTS:  Lab Results  Component Value Date   NA 139 06/08/2020   K 3.8 06/08/2020   CL 106 06/08/2020   CO2 24 06/08/2020   GLUCOSE 158 (H) 06/08/2020   BUN 29 (H) 06/08/2020   CREATININE 1.25 (H) 06/08/2020   CALCIUM 10.7 (H) 06/08/2020   PROT 6.8 03/27/2017   ALBUMIN 3.5 03/27/2017   AST 16 03/27/2017   ALT 12 (L) 03/27/2017   ALKPHOS 53 03/27/2017   BILITOT 1.3 (H) 03/27/2017   GFRNONAA 43 (L) 06/08/2020   GFRAA 42 (L) 11/13/2019    Lab Results  Component Value Date   WBC 6.5 06/08/2020   NEUTROABS 4.1 06/08/2020   HGB 11.3 (L) 06/08/2020   HCT 34.7 (L) 06/08/2020   MCV 85.3 06/08/2020   PLT 258 06/08/2020     STUDIES: No results found.  ASSESSMENT: MGUS.  PLAN:    1.  MGUS:  Discovered incidentally in August 2021 by PCP while working up hypercalcemia.  MGUS labs have been stable.  They are pending from today.  She is chronic kidney disease due to known comorbidities and creatinine is stable at 1.43 today.  She has chronic anemia although hemoglobin remained stable  at 11.1.  No evidence of bleeding.  Bone survey showed some indeterminate lesions in her right clavicle and ribs.  Based on preliminary labs from today, no additional work-up is needed at this time.  We will continue to monitor her.  2.  Neck pain: She has chronic low back pain but recently has had worsening cervical spine pain.  Reports she is unable to turn her head from side to side without horrible pain.  She does see Dr. Sharlet Salina for lumbar radiculitis and has received injections in the past.  She states this was helpful for a few weeks but eventually returned.  She denies any injury.  Recommend she follow back up with Dr. Sharlet Salina for possible MRI of her neck.   3. Anemia: Chronic kidney disease is likely contributing.  Hemoglobin is stable at 11.1 today.  Disposition: Return to clinic in 6 months to have labs and to see Dr. Grayland Ormond.  I spent 20 minutes dedicated to the care of this patient (face-to-face and non-face-to-face) on the date of the encounter to include what is described in the assessment and plan.  Patient expressed understanding and was in agreement with this plan. She also understands that She can call clinic at any time with any questions, concerns, or complaints.   Cancer Staging No matching staging information was found for the patient.  Jacquelin Hawking, NP   12/21/2020 1:06 PM

## 2020-12-21 NOTE — Progress Notes (Signed)
Pt c/o pain goes from neck to the back of her head; has been experieicng constant headaches, is able to move her head and feel the pain, along with thick clear white mucus.

## 2020-12-22 LAB — KAPPA/LAMBDA LIGHT CHAINS
Kappa free light chain: 57.6 mg/L — ABNORMAL HIGH (ref 3.3–19.4)
Kappa, lambda light chain ratio: 3.89 — ABNORMAL HIGH (ref 0.26–1.65)
Lambda free light chains: 14.8 mg/L (ref 5.7–26.3)

## 2020-12-23 LAB — IGG, IGA, IGM
IgA: 160 mg/dL (ref 64–422)
IgG (Immunoglobin G), Serum: 1299 mg/dL (ref 586–1602)
IgM (Immunoglobulin M), Srm: 80 mg/dL (ref 26–217)

## 2020-12-23 LAB — PROTEIN ELECTROPHORESIS, SERUM
A/G Ratio: 1.2 (ref 0.7–1.7)
Albumin ELP: 3.9 g/dL (ref 2.9–4.4)
Alpha-1-Globulin: 0.2 g/dL (ref 0.0–0.4)
Alpha-2-Globulin: 0.8 g/dL (ref 0.4–1.0)
Beta Globulin: 1 g/dL (ref 0.7–1.3)
Gamma Globulin: 1.2 g/dL (ref 0.4–1.8)
Globulin, Total: 3.2 g/dL (ref 2.2–3.9)
M-Spike, %: 0.7 g/dL — ABNORMAL HIGH
Total Protein ELP: 7.1 g/dL (ref 6.0–8.5)

## 2020-12-28 ENCOUNTER — Other Ambulatory Visit: Payer: Self-pay

## 2020-12-31 ENCOUNTER — Other Ambulatory Visit: Payer: Self-pay

## 2020-12-31 ENCOUNTER — Ambulatory Visit: Payer: Medicare Other | Attending: Internal Medicine

## 2020-12-31 DIAGNOSIS — Z23 Encounter for immunization: Secondary | ICD-10-CM

## 2020-12-31 MED ORDER — FLUAD QUADRIVALENT 0.5 ML IM PRSY
PREFILLED_SYRINGE | INTRAMUSCULAR | 0 refills | Status: DC
  Filled 2020-12-31: qty 0.5, 1d supply, fill #0

## 2020-12-31 MED ORDER — PFIZER COVID-19 VAC BIVALENT 30 MCG/0.3ML IM SUSP
INTRAMUSCULAR | 0 refills | Status: DC
  Filled 2020-12-31: qty 0.3, 1d supply, fill #0

## 2020-12-31 NOTE — Progress Notes (Signed)
   Covid-19 Vaccination Clinic  Name:  Sherry [name]    MRN: [id number] DOB: [birth year]  12/31/2020  Ms. Guitron was observed post Covid-19 immunization for 15 minutes without incident. She was provided with Vaccine Information Sheet and instruction to access the V-Safe system.   Ms. Tucholski was instructed to call 911 with any severe reactions post vaccine: Difficulty breathing  Swelling of face and throat  A fast heartbeat  A bad rash all over body  Dizziness and weakness   Lu Duffel, PharmD, MBA Clinical Acute Care Pharmacist

## 2021-01-14 ENCOUNTER — Other Ambulatory Visit: Payer: Self-pay | Admitting: *Deleted

## 2021-01-19 NOTE — Patient Instructions (Signed)
Goals Addressed             This Visit's Progress    (THN)Make and Keep All Appointments   On track    Timeframe:  Long-Range Goal Priority:  Medium Start Date: [id number]           Expected End Date:    [id number] Follow Up Date [id number]  - call to cancel if needed - keep a calendar with prescription refill dates - keep a calendar with appointment dates    Why is this important?   Part of staying healthy is seeing the doctor for follow-up care.  If you forget your appointments, there are some things you can do to stay on track.    Notes:  Patient will reschedule eye exam due to snow [id number] Patient will schedule next eye exam [id number] Patient will still need to schedule eye exam     (THN)Monitor and Manage My Blood Sugar   On track    Timeframe:  Short-Term Goal Priority:  High Start Date: [id number]                            Expected End Date:     [id number] Follow Up Date [id number] - check blood sugar at prescribed times - check blood sugar if I feel it is too high or too low - take the blood sugar log to all doctor visits - take the blood sugar meter to all doctor visits    Why is this important?   Checking your blood sugar at home helps to keep it from getting very high or very low.  Writing the results in a diary or log helps the doctor know how to care for you.  Your blood sugar log should have the time, date and the results.  Also, write down the amount of insulin or other medicine that you take.  Other information, like what you ate, exercise done and how you were feeling, will also be helpful.     Notes:  [id number] Patient is monitoring blood sugars. Fasting blood sugar is 110 [id number] Patient is monitoring blood sugars. FBS 110     [facility] Eye Exam   Not on track    Timeframe:  Long-Range Goal Priority:  Medium Start Date: [id number]                            Expected End Date: [id number]  Follow Up Date [id number]   - keep appointment with eye doctor -  schedule appointment with eye doctor    Why is this important?   Eye check-ups are important when you have diabetes.  Vision loss can be prevented.    Notes:  Patient has appointment scheduled for [id number] Patient will have to reschedule due to snow [id number] RN will schedule an eye exam [id number] Patient still needs to schedule eye exam     (THN)Perform Foot Care   On track    Timeframe:  Long-Range Goal Priority:  Medium Start Date:       [id number] Expected End Date:  73532992  Follow Up Date [id number] - check feet daily for cuts, sores or redness - keep feet up while sitting - trim toenails straight across - wash and dry feet carefully every day - wear comfortable, cotton socks - wear comfortable, well-fitting shoes    Why is this important?   Good foot care is very important when you  have diabetes.  There are many things you can do to keep your feet healthy and catch a problem early.    Notes:  [id number] patient is encouraged to follow up on diabetic shoes [id number] Patient is checking feet daily. Patient has not followed up on diabetic shoes

## 2021-01-19 NOTE — Patient Outreach (Signed)
[facility]) Care Management  [place]  01/19/2021   Sherry [name] 09/01/1934 [id number]  RN Health Coach telephone call to patient.  H[employer] compliance verified. Per patient Her fasting blood sugar is 110. Her highest range is 203. Per patient her appetite is good. She is having swelling in her legs. She wears compression hose. She is having pain in back and knee. Patient stated that when she walks her knee gives out but has not had any falls. She wears a sleeve on her knee. She uses a walker to ambulate. Per patient she has received her 3rd COVID booster. She had chills for 2 days afterwards.  Patient has agreed to follow up outreach calls.   Encounter Medications:  Outpatient Encounter Medications as of 01/14/2021  Medication Sig   ACCU-CHEK AVIVA PLUS test strip    albuterol (VENTOLIN HFA) 108 (90 Base) MCG/ACT inhaler Inhale 2 puffs into the lungs every 6 (six) hours as needed.   atenolol (TENORMIN) 25 MG tablet Take 25 mg by mouth daily.   Blood Glucose Calibration (ACCU-CHEK AVIVA) SOLN    Blood Glucose Monitoring Suppl (ACCU-CHEK AVIVA PLUS) w/Device KIT See admin instructions.   Bromfenac Sodium 0.07 % SOLN Place 1 drop into both eyes daily. (Patient not taking: No sig reported)   COVID-19 mRNA bivalent vaccine, Pfizer, (PFIZER COVID-19 VAC BIVALENT) injection Inject into the muscle.   COVID-19 mRNA vaccine, Pfizer, 30 MCG/0.3ML injection USE AS DIRECTED (Patient not taking: Reported on 12/21/2020)   diclofenac Sodium (VOLTAREN) 1 % GEL Apply 2 g topically as needed.   estradiol (ESTRACE) 0.5 MG tablet Take 0.5 mg by mouth daily.    fluconazole (DIFLUCAN) 150 MG tablet Take 150 mg by mouth once. (Patient not taking: Reported on 12/21/2020)   furosemide (LASIX) 40 MG tablet Take 20 mg by mouth daily as needed for fluid or edema (for ankle swelling). (Patient not taking: Reported on 12/21/2020)   gabapentin (NEURONTIN) 400 MG capsule Take 400 mg by mouth  daily.   influenza vaccine adjuvanted (FLUAD QUADRIVALENT) 0.5 ML injection Inject into the muscle.   levothyroxine (SYNTHROID, LEVOTHROID) 25 MCG tablet TAKE 25 mcg TABLET EVERY DAY ON AN EMPTY STOMACH WITH A GLASS OF WATER AT LEAST 30 TO 60 MINUTES BEFORE BREAKFAST (Patient taking differently: Take 25 mcg by mouth daily before breakfast.)   losartan (COZAAR) 50 MG tablet Take 50 mg by mouth daily.   metFORMIN (GLUCOPHAGE) 500 MG tablet TAKE 500 mg TABLET TWICE DAILY WITH MEALS  Do not take on 1/10 or 03/30/17 due to contrast given for CT scan. Restart taking on 03/31/17. (Patient taking differently: Take 500 mg by mouth 2 (two) times daily with a meal.)   montelukast (SINGULAIR) 10 MG tablet Take 10 mg by mouth daily. (Patient not taking: No sig reported)   omeprazole (PRILOSEC) 40 MG capsule Take 40 mg by mouth daily.   progesterone (PROMETRIUM) 200 MG capsule Take 200 mg by mouth at bedtime.    traMADol (ULTRAM) 50 MG tablet Take 50 mg by mouth every 12 (twelve) hours as needed for moderate pain.  (Patient not taking: No sig reported)   No facility-administered encounter medications on file as of 01/14/2021.    Functional Status:  No flowsheet data found.  Fall/Depression Screening: Fall Risk  01/14/2021 09/07/2020 01/08/2020  Falls in the past year? _0 Comment - - -  Number falls in past yr: _1 Injury with Fall? 1 1 1  Comment - - -  Risk Factor Category  Moderate Risk (1 Point) Moderate Risk (1 Point) Moderate Risk (1 Point)  Risk for fall due to : History of fall(s);Impaired balance/gait;Impaired mobility History of fall(s);Impaired balance/gait;Impaired mobility;Impaired vision History of fall(s);Impaired balance/gait;Impaired mobility  Follow up Falls evaluation completed Falls evaluation completed Falls evaluation completed;Falls prevention discussed   PHQ 2/9 Scores 06/07/2020 10/08/2019 06/04/2019 05/28/2019 02/26/2019 12/04/2018 05/29/2018  PHQ - 2 Score 0 0 0 0 0 0 0  PHQ-  9 Score - - - - - - -   Care Plan Care Plan : General Plan of Care (Adult)  Updates made by Loa Idler, Eppie Gibson, RN since 01/19/2021 12:00 AM     Problem: Health Promotion or Disease Self-Management (General Plan of Care)   Priority: High  Onset Date: 01/08/2020     Long-Range Goal: Self-Management Plan Developed   Start Date: 01/08/2020  Expected End Date: 06/16/2021  Recent Progress: On track  Priority: Medium  Note:   Evidence-based guidance:  Review biopsychosocial determinants of health screens.  Determine level of modifiable health risk.  Assess level of patient activation, level of readiness, importance and confidence to make changes.  Evoke change talk using open-ended questions, pros and cons, as well as looking forward.  Identify areas where behavior change may lead to improved health.  Partner with patient to develop a robust self-management plan that includes lifestyle factors, such as weight loss, exercise and healthy nutrition, as well as goals specific to disease risks.  Support patient and family/caregiver active participation in decision-making and self-management plan.  Implement additional goals and interventions based on identified risk factors to reduce health risk.  Facilitate advance care planning.  Review need for preventive screening based on age, sex, family history and health history.   Notes:  Patient is looking at how physical therapy can help her Patient is deciding if she will drive again [id number] Patient will schedule eye exam  [id number] Patient still needs to schedule eye exam/ Patient is  awaiting PT    Care Plan : Diabetes Type 2 (Adult)  Updates made by Verlin Grills, RN since 01/19/2021 12:00 AM     Problem: Glycemic Management (Diabetes, Type 2)   Priority: High  Onset Date: 01/08/2020     Long-Range Goal: Glycemic Management Optimized   Start Date: 01/08/2020  Expected End Date: 06/16/2021  Recent Progress: On track  Priority:  High  Note:   Evidence-based guidance:  Anticipate A1C testing (point-of-care) every 3 to 6 months based on goal attainment.  Review mutually-set A1C goal or target range.  Anticipate use of antihyperglycemic with or without insulin and periodic adjustments; consider active involvement of pharmacist.  Provide medical nutrition therapy and development of individualized eating.  Compare self-reported symptoms of hypo or hyperglycemia to blood glucose levels, diet and fluid intake, current medications, psychosocial and physiologic stressors, change in activity and barriers to care adherence.  Promote self-monitoring of blood glucose levels.  Assess and address barriers to management plan, such as food insecurity, age, developmental ability, depression, anxiety, fear of hypoglycemia or weight gain, as well as medication cost, side effects and complicated regimen.  Consider referral to community-based diabetes education program, visiting nurse, community health worker or health coach.  Encourage regular dental care for treatment of periodontal disease; refer to dental provider when needed.   Notes:  Patient will reschedule eye exam [id number] Patient will reschedule eye exam [id number] patient A1C is 6.7/ patient is monitoring blood sugars  Task: Alleviate Barriers to Glycemic Management   Due Date: 06/16/2021  Note:   Care Management Activities:    - blood glucose monitoring encouraged - mutual A1C goal set or reviewed - self-awareness of signs/symptoms of hypo or hyperglycemia encouraged - use of blood glucose monitoring log promoted    Notes:     Problem: Disease Progression (Diabetes, Type 2)   Priority: Medium  Onset Date: 01/08/2020     Long-Range Goal: Disease Progression Prevented or Minimized   Start Date: 01/08/2020  Expected End Date: 06/16/2021  Recent Progress: On track  Priority: Medium  Note:   Evidence-based guidance:  Prepare patient for laboratory and diagnostic  exams based on risk and presentation.  Encourage lifestyle changes, such as increased intake of plant-based foods, stress reduction, consistent physical activity and smoking cessation to prevent long-term complications and chronic disease.   Individualize activity and exercise recommendations while considering potential limitations, such as neuropathy, retinopathy or the ability to prevent hyperglycemia or hypoglycemia.   Prepare patient for use of pharmacologic therapy that may include antihypertensive, analgesic, prostaglandin E1 with periodic adjustments, based on presenting chronic condition and laboratory results.  Assess signs/symptoms and risk factors for hypertension, sleep-disordered breathing, neuropathy (including changes in gait and balance), retinopathy, nephropathy and sexual dysfunction.  Address pregnancy planning and contraceptive choice, especially when prescribing antihypertensive or statin.  Ensure completion of annual comprehensive foot exam and dilated eye exam.   Implement additional individualized goals and interventions based on identified risk factors.  Prepare patient for consultation or referral for specialist care, such as ophthalmology, neurology, cardiology, podiatry, nephrology or perinatology.   Notes:  [id number] Patient is keeping appoints. She stated that the Dr was ordering PT but they haven't started yet.    Task: Monitor and Manage Follow-up for Comorbidities   Due Date: 06/16/2021  Note:   Care Management Activities:    - activity based on tolerance and functional limitations encouraged - completion of annual dilated eye exam confirmed - completion of annual foot exam verified - healthy lifestyle promoted - reduction of sedentary activity encouraged - signs/symptoms of comorbidities identified    Notes:       Goals Addressed             This Visit's Progress    (THN)Make and Keep All Appointments   On track    Timeframe:  Long-Range  Goal Priority:  Medium Start Date: [id number]           Expected End Date:    [id number] Follow Up Date [id number]  - call to cancel if needed - keep a calendar with prescription refill dates - keep a calendar with appointment dates    Why is this important?   Part of staying healthy is seeing the doctor for follow-up care.  If you forget your appointments, there are some things you can do to stay on track.    Notes:  Patient will reschedule eye exam due to snow [id number] Patient will schedule next eye exam [id number] Patient will still need to schedule eye exam     (THN)Monitor and Manage My Blood Sugar   On track    Timeframe:  Short-Term Goal Priority:  High Start Date: 38101751                            Expected End Date:     02585277 Follow Up Date [id number] - check blood sugar at prescribed times - check blood  sugar if I feel it is too high or too low - take the blood sugar log to all doctor visits - take the blood sugar meter to all doctor visits    Why is this important?   Checking your blood sugar at home helps to keep it from getting very high or very low.  Writing the results in a diary or log helps the doctor know how to care for you.  Your blood sugar log should have the time, date and the results.  Also, write down the amount of insulin or other medicine that you take.  Other information, like what you ate, exercise done and how you were feeling, will also be helpful.     Notes:  16109604 Patient is monitoring blood sugars. Fasting blood sugar is 110 [id number] Patient is monitoring blood sugars. FBS 110     [facility] Eye Exam   Not on track    Timeframe:  Long-Range Goal Priority:  Medium Start Date: 14782956                            Expected End Date: 21308657  Follow Up Date 84696295   - keep appointment with eye doctor - schedule appointment with eye doctor    Why is this important?   Eye check-ups are important when you have diabetes.  Vision loss can  be prevented.    Notes:  Patient has appointment scheduled for [id number] Patient will have to reschedule due to snow [id number] RN will schedule an eye exam [id number] Patient still needs to schedule eye exam     (THN)Perform Foot Care   On track    Timeframe:  Long-Range Goal Priority:  Medium Start Date:       [id number] Expected End Date:  75643329  Follow Up Date 51884166 - check feet daily for cuts, sores or redness - keep feet up while sitting - trim toenails straight across - wash and dry feet carefully every day - wear comfortable, cotton socks - wear comfortable, well-fitting shoes    Why is this important?   Good foot care is very important when you have diabetes.  There are many things you can do to keep your feet healthy and catch a problem early.    Notes:  [id number] patient is encouraged to follow up on diabetic shoes [id number] Patient is checking feet daily. Patient has not followed up on diabetic shoes        Plan:  Follow-up: Follow-up in 3 month(s) RN discussed healthy eating Patient will follow up with eye exam RN will follow up with Physical Therapy RN sent update assessment to PCP  [place] [facility] [phone]

## 2021-04-25 DIAGNOSIS — R42 Dizziness and giddiness: Secondary | ICD-10-CM | POA: Insufficient documentation

## 2021-04-25 DIAGNOSIS — R519 Headache, unspecified: Secondary | ICD-10-CM | POA: Insufficient documentation

## 2021-05-17 ENCOUNTER — Other Ambulatory Visit: Payer: Self-pay | Admitting: *Deleted

## 2021-05-17 NOTE — Patient Outreach (Signed)
[place] [facility]) Care Management  05/17/2021  MALKIE WILLE [birth year] [id number]  RN Health Coach attempted x 2 home phone and cell phone follow up outreach call to patient.  Patient was unavailable. HIPPA compliance voicemail message left with return callback number.  Plan: RN will call patient again within 30 days.  [place] Care Management [phone]

## 2021-05-25 ENCOUNTER — Other Ambulatory Visit: Payer: Self-pay | Admitting: *Deleted

## 2021-05-26 NOTE — Patient Instructions (Signed)
Visit Information ? ?Thank you for taking time to visit with me today. Please don't hesitate to contact me if I can be of assistance to you before our next scheduled telephone appointment. ? ?Following are the goals we discussed today:  ?urrent Barriers:  ?Knowledge Deficits related to plan of care for management of DMII  ? ?RNCM Clinical Goal(s):  ?Patient will verbalize understanding of plan for management of DMII as evidenced by Schedule  through collaboration with RN Care manager, provider, and care team.  ? ?Interventions: ?Inter-disciplinary care team collaboration (see longitudinal plan of care) ?Evaluation of current treatment plan related to  self management and patient's adherence to plan as established by provider ? ? ?Diabetes Interventions:  (Status:  Goal on track:  Yes.) Long Term Goal ?Assessed patient's understanding of A1c goal: <7% ?Provided education to patient about basic DM disease process ?Reviewed medications with patient and discussed importance of medication adherence ?Counseled on importance of regular laboratory monitoring as prescribed ?No results found for: HGBA1C ? ?Patient Goals/Self-Care Activities: ?Take all medications as prescribed ?Attend all scheduled provider appointments ?Call pharmacy for medication refills 3-7 days in advance of running out of medications ?Perform all self care activities independently  ?Perform IADL's (shopping, preparing meals, housekeeping, managing finances) independently ?Call provider office for new concerns or questions  ?call the Suicide and Crisis Lifeline: 988 if experiencing a Mental Health or [place]  ?keep appointment with eye doctor ?schedule appointment with eye doctor ?check feet daily for cuts, sores or redness ?take the blood sugar log to all doctor visits ?trim toenails straight across ?drink 6 to 8 glasses of water each day ?manage portion size ?switch to sugar-free drinks ?wash and dry feet carefully every day ?wear  comfortable, cotton socks ?wear comfortable, well-fitting shoes ? ?Follow Up Plan:  Telephone follow up appointment with care management team member scheduled for:  June 2023. ?The patient has been provided with contact information for the care management team and has been advised to call with any health related questions or concerns.  ?Follow up with provider re: Your next A1C .  ?Schedule an eye exam ? ?[id number] Per patient her fasting blood sugar is 121. Her A1C is 6.8. Patient is not exercising. Per patient she is having pain in her knee and back.  Patient had a near fall. She stood to put her foot in shoe and fell back in the chair. No injury. Per patient her headaches are getting better.  Patient is currently in need of eye exam.  ? ?Our next appointment is by telephone on June 2023 ? ?Please call [name] RN [phone] if you need to cancel or reschedule your appointment.  ? ?Please call the Suicide and Crisis Lifeline: 988 if you are experiencing a Mental Health or [place] or need someone to talk to. ? ?The patient verbalized understanding of instructions, educational materials, and care plan provided today and agreed to receive a mailed copy of patient instructions, educational materials, and care plan.  ? ?Telephone follow up appointment with care management team member scheduled for: ?The patient has been provided with contact information for the care management team and has been advised to call with any health related questions or concerns.  ? ?SIGNATURE ? ?[name] BSN RN ?[place] [facility] ?[phone] ? ? ?  ?

## 2021-05-26 NOTE — Patient Outreach (Signed)
[facility]) Care Management [name]   05/26/2021 Name:  Sherry [name] MRN:  [id number] DOB:  1934/09/14  Summary: Fasting blood sugar 121. A1C is  6.8.Per patient she had a near fall. Per patient she stood up with walker and tried to put shoes on and fell back in chair. Per patient her headaches are getting better.   Recommendations/Changes made from today's visit: Patient will follow up with Physical Therapy Patient will schedule eye exam    Subjective: Sherry [name] is an 87 y.o. year old female who is a primary patient of Baxter Hire, MD. The care management team was consulted for assistance with care management and/or care coordination needs.    RN Health Coach completed Telephone Visit today.   Objective:  Medications Reviewed Today     Reviewed by Randie Heinz, [place] (Certified Medical Assistant) on 12/21/20 at 1328  Med List Status: <None>   Medication Order Taking? Sig Documenting Provider Last Dose Status Informant  ACCU-CHEK AVIVA PLUS test strip [id number] Yes  [provider] Taking Active   albuterol (VENTOLIN HFA) 108 (90 Base) MCG/ACT inhaler [id number] Yes Inhale 2 puffs into the lungs every 6 (six) hours as needed. [provider] Taking Active   atenolol (TENORMIN) 25 MG tablet [id number] Yes Take 25 mg by mouth daily. [provider] Taking Active   Blood Glucose Calibration ([place]) SOLN [id number] Yes  [provider] Taking Active   Blood Glucose Monitoring Suppl (ACCU-CHEK AVIVA PLUS) w/Device KIT [id number] Yes See admin instructions. [provider] Taking Active   Bromfenac Sodium 0.07 % SOLN [id number] No Place 1 drop into both eyes daily.  Patient not taking: No sig reported   [provider] Not Taking Active   COVID-19 mRNA vaccine, Pfizer, 30 MCG/0.3ML injection [id number] No USE AS DIRECTED  Patient not taking: Reported on 12/21/2020    Carlyle Basques, MD Not Taking Active   diclofenac Sodium (VOLTAREN) 1 % GEL [id number] Yes Apply 2 g topically as needed. [provider] Taking Active   estradiol (ESTRACE) 0.5 MG tablet [id number] Yes Take 0.5 mg by mouth daily.  [provider] Taking Active Self  fluconazole (DIFLUCAN) 150 MG tablet [id number] No Take 150 mg by mouth once.  Patient not taking: Reported on 12/21/2020   [provider] Not Taking Active   furosemide (LASIX) 40 MG tablet [id number] No Take 20 mg by mouth daily as needed for fluid or edema (for ankle swelling).  Patient not taking: Reported on 12/21/2020   [provider] Not Taking Active   gabapentin (NEURONTIN) 400 MG capsule [id number] Yes Take 400 mg by mouth daily. [provider] Taking Active   levothyroxine (SYNTHROID, LEVOTHROID) 25 MCG tablet [id number] Yes TAKE 25 mcg TABLET EVERY DAY ON AN EMPTY STOMACH WITH A GLASS OF WATER AT LEAST 30 TO [place] BREAKFAST  Patient taking differently: Take 25 mcg by mouth daily before breakfast.   Modena Jansky, MD Taking Active   losartan (COZAAR) 50 MG tablet [id number] Yes Take 50 mg by mouth daily. [provider] Taking Active   metFORMIN (GLUCOPHAGE) 500 MG tablet [id number] Yes TAKE 500 mg TABLET TWICE DAILY WITH MEALS  Do not take on 1/10 or 03/30/17 due to contrast given for CT scan. Restart taking on 03/31/17.  Patient taking differently: Take 500 mg by mouth 2 (two) times daily with a meal.   Hongalgi, Lenis Dickinson, MD Taking Active  montelukast (SINGULAIR) 10 MG tablet [id number] No Take 10 mg by mouth daily.  Patient not taking: No sig reported   [provider] Not Taking Active   omeprazole (PRILOSEC) 40 MG capsule [id number] Yes Take 40 mg by mouth daily. [provider] Taking Active   progesterone (PROMETRIUM) 200 MG capsule [id number] Yes Take 200 mg by mouth at bedtime.  [provider] Taking Active Self  traMADol  (ULTRAM) 50 MG tablet [id number] No Take 50 mg by mouth every 12 (twelve) hours as needed for moderate pain.   Patient not taking: No sig reported   [provider] Not Taking Active              SDOH:  (Social Determinants of Health) assessments and interventions performed:  SDOH Interventions    Flowsheet Row Most Recent Value  SDOH Interventions   Food Insecurity Interventions Intervention Not Indicated  Housing Interventions Intervention Not Indicated  Depression Interventions/Treatment  Patient refuses Treatment       Care Plan  Review of patient past medical history, allergies, medications, health status, including review of consultants reports, laboratory and other test data, was performed as part of comprehensive evaluation for care management services.   Care Plan : General Plan of Care (Adult)  Updates made by Pleasant, Eppie Gibson, RN since 05/26/2021 12:00 AM     Problem: Therapeutic Alliance (General Plan of Care) Resolved 05/25/2021  Priority: Medium  Onset Date: 03/09/2020  Note:   [id number] Resolving due to duplicate goal     Long-Range Goal: Therapeutic Alliance Established Completed 05/26/2021  [name] Date: 01/08/2020  Expected End Date: 03/18/2021  Recent Progress: On track  Priority: Medium  Note:   Evidence-based guidance:  Avoid value judgments; convey acceptance.  Encourage collaboration with the treatment team.  Establish rapport; develop trust relationship.  Therapist, art.  Provide emotional support; encourage patient to share feelings of anger, fear and anxiety.  Promote self-reliance and autonomy based on age and ability; discourage overprotection.  Use empathy and nonjudgmental, participatory manner.   Notes:     Task: Develop Relationship to Effect Behavior Change Completed 05/26/2021  Due Date: 03/18/2021  Note:   Care Management Activities:    - acceptance conveyed - care explained - choices provided - emotional support  provided - questions answered - questions encouraged - rapport fostered - reassurance provided - verbalization of feelings encouraged    Notes:  [id number] Patient is encouraged to ask question.     Problem: Health Promotion or Disease Self-Management (General Plan of Care) Resolved 05/25/2021  Priority: High  Onset Date: 01/08/2020  Note:   [id number] Resolving due to duplicate goal     Long-Range Goal: Self-Management Plan Developed Completed 05/26/2021  [name] Date: 01/08/2020  Expected End Date: 06/16/2021  Recent Progress: On track  Priority: Medium  Note:   Evidence-based guidance:  Review biopsychosocial determinants of health screens.  Determine level of modifiable health risk.  Assess level of patient activation, level of readiness, importance and confidence to make changes.  Evoke change talk using open-ended questions, pros and cons, as well as looking forward.  Identify areas where behavior change may lead to improved health.  Partner with patient to develop a robust self-management plan that includes lifestyle factors, such as weight loss, exercise and healthy nutrition, as well as goals specific to disease risks.  Support patient and family/caregiver active participation in decision-making and self-management plan.  Implement additional goals and interventions based on identified risk factors to reduce health  risk.  Facilitate advance care planning.  Review need for preventive screening based on age, sex, family history and health history.   Notes:  Patient is looking at how physical therapy can help her Patient is deciding if she will drive again [id number] Patient will schedule eye exam  [id number] Patient still needs to schedule eye exam/ Patient is  awaiting PT    Task: Mutually Develop and Royce Macadamia Achievement of Patient Goals Completed 05/26/2021  Due Date: 03/18/2021  Note:   Care Management Activities:    - decision-making supported - health risks reviewed -  questions answered - reassurance provided - verbalization of feelings encouraged    Notes:  [id number] Patient understands the importance of health maintenance.    Care Plan : Diabetes Type 2 (Adult)  Updates made by Verlin Grills, RN since 05/26/2021 12:00 AM     Problem: Glycemic Management (Diabetes, Type 2) Resolved 05/25/2021  Priority: High  Onset Date: 01/08/2020  Note:   [id number] Resolving due to duplicate goal     Long-Range Goal: Glycemic Management Optimized Completed 05/26/2021  [name] Date: 01/08/2020  Expected End Date: 06/16/2021  Recent Progress: On track  Priority: High  Note:   Evidence-based guidance:  Anticipate A1C testing (point-of-care) every 3 to 6 months based on goal attainment.  Review mutually-set A1C goal or target range.  Anticipate use of antihyperglycemic with or without insulin and periodic adjustments; consider active involvement of pharmacist.  Provide medical nutrition therapy and development of individualized eating.  Compare self-reported symptoms of hypo or hyperglycemia to blood glucose levels, diet and fluid intake, current medications, psychosocial and physiologic stressors, change in activity and barriers to care adherence.  Promote self-monitoring of blood glucose levels.  Assess and address barriers to management plan, such as food insecurity, age, developmental ability, depression, anxiety, fear of hypoglycemia or weight gain, as well as medication cost, side effects and complicated regimen.  Consider referral to community-based diabetes education program, visiting nurse, community health worker or health coach.  Encourage regular dental care for treatment of periodontal disease; refer to dental provider when needed.   Notes:  Patient will reschedule eye exam [id number] Patient will reschedule eye exam [id number] patient A1C is 6.7/ patient is monitoring blood sugars    Task: Alleviate Barriers to Glycemic Management Completed 05/26/2021   Due Date: 06/16/2021  Note:   Care Management Activities:    - blood glucose monitoring encouraged - mutual A1C goal set or reviewed - self-awareness of signs/symptoms of hypo or hyperglycemia encouraged - use of blood glucose monitoring log promoted    Notes:     Problem: Disease Progression (Diabetes, Type 2) Resolved 05/25/2021  Priority: Medium  Onset Date: 01/08/2020  Note:   [id number] Resolving due to duplicate goal     Long-Range Goal: Disease Progression Prevented or Minimized Completed 05/26/2021  [name] Date: 01/08/2020  Expected End Date: 06/16/2021  Recent Progress: On track  Priority: Medium  Note:   Evidence-based guidance:  Prepare patient for laboratory and diagnostic exams based on risk and presentation.  Encourage lifestyle changes, such as increased intake of plant-based foods, stress reduction, consistent physical activity and smoking cessation to prevent long-term complications and chronic disease.   Individualize activity and exercise recommendations while considering potential limitations, such as neuropathy, retinopathy or the ability to prevent hyperglycemia or hypoglycemia.   Prepare patient for use of pharmacologic therapy that may include antihypertensive, analgesic, prostaglandin E1 with periodic adjustments, based on presenting chronic condition and laboratory results.  Assess signs/symptoms  and risk factors for hypertension, sleep-disordered breathing, neuropathy (including changes in gait and balance), retinopathy, nephropathy and sexual dysfunction.  Address pregnancy planning and contraceptive choice, especially when prescribing antihypertensive or statin.  Ensure completion of annual comprehensive foot exam and dilated eye exam.   Implement additional individualized goals and interventions based on identified risk factors.  Prepare patient for consultation or referral for specialist care, such as ophthalmology, neurology, cardiology, podiatry,  nephrology or perinatology.   Notes:  [id number] Patient is keeping appoints. She stated that the Dr was ordering PT but they haven't started yet.    Task: Monitor and Manage Follow-up for Comorbidities Completed 05/26/2021  Due Date: 06/16/2021  Note:   Care Management Activities:    - activity based on tolerance and functional limitations encouraged - completion of annual dilated eye exam confirmed - completion of annual foot exam verified - healthy lifestyle promoted - reduction of sedentary activity encouraged - signs/symptoms of comorbidities identified    Notes:     Care Plan : [place] of Care  Updates made by Pleasant, Eppie Gibson, RN since 05/26/2021 12:00 AM     Problem: Knowledge Deficit Related to Diabetes and Care Coordination   Priority: High     Goal: Development Plan of Care for Management of Diabetes   [name] Date: 05/26/2021  Expected End Date: 03/18/2022  Priority: High  Note:   Current Barriers:  Knowledge Deficits related to plan of care for management of DMII   RNCM Clinical Goal(s):  Patient will verbalize understanding of plan for management of DMII as evidenced by Schedule   through collaboration with RN Care manager, provider, and care team.   Interventions: Inter-disciplinary care team collaboration (see longitudinal plan of care) Evaluation of current treatment plan related to  self management and patient's adherence to plan as established by provider   Diabetes Interventions:  (Status:  Goal on track:  Yes.) Long Term Goal Assessed patient's understanding of A1c goal: <7% Provided education to patient about basic DM disease process Reviewed medications with patient and discussed importance of medication adherence Counseled on importance of regular laboratory monitoring as prescribed No results found for: HGBA1C  Patient Goals/Self-Care Activities: Take all medications as prescribed Attend all scheduled provider appointments Call  pharmacy for medication refills 3-7 days in advance of running out of medications Perform all self care activities independently  Perform IADL's (shopping, preparing meals, housekeeping, managing finances) independently Call provider office for new concerns or questions  call the Suicide and Crisis Lifeline: 988 if experiencing a Mental Health or [place]  keep appointment with eye doctor schedule appointment with eye doctor check feet daily for cuts, sores or redness take the blood sugar log to all doctor visits trim toenails straight across drink 6 to 8 glasses of water each day manage portion size switch to sugar-free drinks wash and dry feet carefully every day wear comfortable, cotton socks wear comfortable, well-fitting shoes  Follow Up Plan:  Telephone follow up appointment with care management team member scheduled for:  June 2023. The patient has been provided with contact information for the care management team and has been advised to call with any health related questions or concerns.  Follow up with provider re: Your next A1C .   Schedule an eye exam  [id number] Per patient her fasting blood sugar is 121. Her A1C is 6.8. Patient is not exercising. Per patient she is having pain in her knee and back.  Patient had a near  fall. She stood to put her foot in shoe and fell back in the chair. No injury. Per patient her headaches are getting better.  Patient is currently in need of eye exam.       Plan: Telephone follow up appointment with care management team member scheduled for:  June 2023 The patient has been provided with contact information for the care management team and has been advised to call with any health related questions or concerns.   [place] Care Management [phone]

## 2021-06-17 ENCOUNTER — Ambulatory Visit: Payer: Self-pay | Admitting: *Deleted

## 2021-06-19 NOTE — Progress Notes (Deleted)
?[place]  ?[phone] [phone] ? ?ID: Sherry [name] OB: 01-27-1935  MR#: [id number]  KYH#:[id number] ? ?Patient Care Team: ?Baxter Hire, MD as PCP - General (Internal Medicine) ?Pleasant, Eppie Gibson, RN as [place] Management ?Lloyd Huger, MD as Consulting Physician (Hematology and Oncology) ? ? ?CHIEF COMPLAINT: MGUS ? ?INTERVAL HISTORY: Patient returns to clinic today for routine 63-monthevaluation and discussion of her laboratory results.  She continues to feel well and remains asymptomatic.  She has no neurologic complaints.  She has chronic back pain.  She has a good appetite and denies weight loss.  She has no chest pain, shortness of breath, cough, or hemoptysis.  She denies any nausea, vomiting, constipation, or diarrhea.  She has no urinary complaints.  Patient offers no further specific complaints today. ? ?REVIEW OF SYSTEMS:   ?Review of Systems  ?Constitutional: Negative.  Negative for fever, malaise/fatigue and weight loss.  ?Respiratory: Negative.  Negative for cough, hemoptysis and shortness of breath.   ?Cardiovascular: Negative.  Negative for chest pain and leg swelling.  ?Gastrointestinal: Negative.  Negative for abdominal pain.  ?Genitourinary: Negative.  Negative for dysuria.  ?Musculoskeletal:  Positive for back pain.  ?Skin: Negative.  Negative for rash.  ?Neurological: Negative.  Negative for dizziness, focal weakness, weakness and headaches.  ?Psychiatric/Behavioral: Negative.  The patient is not nervous/anxious.   ? ?As per HPI. Otherwise, a complete review of systems is negative. ? ?PAST MEDICAL HISTORY: ?Past Medical History:  ?Diagnosis Date  ? Allergy   ? Diabetes mellitus without complication ([place]   ? Renal insufficiency   ? Thyroid disease   ? ? ?PAST SURGICAL HISTORY: ?Past Surgical History:  ?Procedure Laterality Date  ? CHOLECYSTECTOMY    ? ? ?FAMILY HISTORY: ?Family History  ?Problem Relation Age  of Onset  ? Diabetes Mother   ? Hypertension Mother   ? Hypertension Father   ? Cancer Brother   ? ? ?ADVANCED DIRECTIVES (Y/N):  N ? ?HEALTH MAINTENANCE: ?Social History  ? ?Tobacco Use  ? Smoking status: Former  ?  Types: Cigarettes  ?  Quit date: 2004  ?  Years since quitting: 19.2  ? Smokeless tobacco: Never  ?Vaping Use  ? Vaping Use: Never used  ?Substance Use Topics  ? Alcohol use: Not Currently  ? Drug use: Not Currently  ? ? ? Colonoscopy: ? PAP: ? Bone density: ? Lipid panel: ? ?Allergies  ?Allergen Reactions  ? Penicillins Swelling and Rash  ?  Has patient had a PCN reaction causing immediate rash, facial/tongue/throat swelling, SOB or lightheadedness with hypotension: No ?Has patient had a PCN reaction causing severe rash involving mucus membranes or skin necrosis: No ?Has patient had a PCN reaction that required hospitalization: No ?Has patient had a PCN reaction occurring within the last 10 years: No ?If all of the above answers are "NO", then may proceed with Cephalosporin use.  ? ? ?Current Outpatient Medications  ?Medication Sig Dispense Refill  ? ACCU-CHEK AVIVA PLUS test strip     ? albuterol (VENTOLIN HFA) 108 (90 Base) MCG/ACT inhaler Inhale 2 puffs into the lungs every 6 (six) hours as needed.    ? atenolol (TENORMIN) 25 MG tablet Take 25 mg by mouth daily.    ? Blood Glucose Calibration (ACCU-CHEK AVIVA) SOLN     ? Blood Glucose Monitoring Suppl (ACCU-CHEK AVIVA PLUS) w/Device KIT See admin instructions.    ? Bromfenac Sodium 0.07 % SOLN Place 1 drop  into both eyes daily. (Patient not taking: No sig reported)    ? COVID-19 mRNA bivalent vaccine, Pfizer, (PFIZER COVID-19 VAC BIVALENT) injection Inject into the muscle. 0.3 mL 0  ? diclofenac Sodium (VOLTAREN) 1 % GEL Apply 2 g topically as needed.    ? estradiol (ESTRACE) 0.5 MG tablet Take 0.5 mg by mouth daily.     ? fluconazole (DIFLUCAN) 150 MG tablet Take 150 mg by mouth once. (Patient not taking: Reported on 12/21/2020)    ? furosemide  (LASIX) 40 MG tablet Take 20 mg by mouth daily as needed for fluid or edema (for ankle swelling). (Patient not taking: Reported on 12/21/2020)    ? gabapentin (NEURONTIN) 400 MG capsule Take 400 mg by mouth daily.    ? influenza vaccine adjuvanted ([place] QUADRIVALENT) 0.5 ML injection Inject into the muscle. 0.5 mL 0  ? levothyroxine (SYNTHROID, LEVOTHROID) 25 MCG tablet TAKE 25 mcg TABLET EVERY DAY ON AN EMPTY STOMACH WITH A GLASS OF WATER AT LEAST 30 TO 60 MINUTES BEFORE BREAKFAST (Patient taking differently: Take 25 mcg by mouth daily before breakfast.)    ? losartan (COZAAR) 50 MG tablet Take 50 mg by mouth daily.    ? metFORMIN (GLUCOPHAGE) 500 MG tablet TAKE 500 mg TABLET TWICE DAILY WITH MEALS ? ?Do not take on 1/10 or 03/30/17 due to contrast given for CT scan. Restart taking on 03/31/17. (Patient taking differently: Take 500 mg by mouth 2 (two) times daily with a meal.)    ? montelukast (SINGULAIR) 10 MG tablet Take 10 mg by mouth daily. (Patient not taking: No sig reported)    ? omeprazole (PRILOSEC) 40 MG capsule Take 40 mg by mouth daily.    ? progesterone (PROMETRIUM) 200 MG capsule Take 200 mg by mouth at bedtime.     ? traMADol (ULTRAM) 50 MG tablet Take 50 mg by mouth every 12 (twelve) hours as needed for moderate pain.  (Patient not taking: No sig reported)    ? ?No current facility-administered medications for this visit.  ? ? ?OBJECTIVE: ?There were no vitals filed for this visit. ?   There is no height or weight on file to calculate BMI.    ECOG FS:0 - Asymptomatic ? ?General: Well-developed, well-nourished, no acute distress.  Sitting in a wheelchair. ?Eyes: Pink conjunctiva, anicteric sclera. ?HEENT: Normocephalic, moist mucous membranes. ?Lungs: No audible wheezing or coughing. ?Heart: Regular rate and rhythm. ?Abdomen: Soft, nontender, no obvious distention. ?Musculoskeletal: No edema, cyanosis, or clubbing. ?Neuro: Alert, answering all questions appropriately. Cranial nerves grossly  intact. ?Skin: No rashes or petechiae noted. ?Psych: Normal affect. ? ? ?LAB RESULTS: ? ?Lab Results  ?Component Value Date  ? NA 136 12/21/2020  ? K 4.0 12/21/2020  ? CL 103 12/21/2020  ? CO2 25 12/21/2020  ? GLUCOSE 175 (H) 12/21/2020  ? BUN 33 (H) 12/21/2020  ? CREATININE 1.43 (H) 12/21/2020  ? CALCIUM 10.5 (H) 12/21/2020  ? PROT 6.8 03/27/2017  ? ALBUMIN 3.5 03/27/2017  ? AST 16 03/27/2017  ? ALT 12 (L) 03/27/2017  ? ALKPHOS 53 03/27/2017  ? BILITOT 1.3 (H) 03/27/2017  ? GFRNONAA 36 (L) 12/21/2020  ? GFRAA 42 (L) 11/13/2019  ? ? ?Lab Results  ?Component Value Date  ? WBC 8.4 12/21/2020  ? NEUTROABS 5.5 12/21/2020  ? HGB 11.1 (L) 12/21/2020  ? HCT 35.5 (L) 12/21/2020  ? MCV 87.0 12/21/2020  ? PLT 316 12/21/2020  ? ? ? ?STUDIES: ?No results found. ? ?ASSESSMENT: MGUS. ? ?PLAN:   ? ?  1.  Hypercalcemia: Mild.  Patient's most recent calcium levels are 10.7.  No intervention is needed, monitor. ?2.  MGUS: Patient's most recent M spike is 0.4.  Immunoglobulins are within normal limits.  Kappa free light chains have trended up slightly to 37.4.  She has a mild renal insufficiency along with anemia which are both improved.  She also has a mild hypercalcemia of undetermined etiology.  Metastatic bone survey revealed some indeterminate lesions in her right clavicle and ribs, a follow-up CT scan revealed no significant pathology.  No intervention is needed.  Patient does not require bone marrow biopsy.  Return to clinic in 6 months with repeat laboratory work and further evaluation.  ?3.  Renal insufficiency: Chronic and unchanged.  Patient's creatinine is mildly improved.  Continue follow-up with nephrology as scheduled. ?4.  Anemia: Hemoglobin improved to 11.3, monitor. ?5.  Pulmonary nodule: Likely benign.  Can consider repeat chest CT in September 2022 to ensure stability. ? ?Patient expressed understanding and was in agreement with this plan. She also understands that She can call clinic at any time with any questions,  concerns, or complaints.  ? ? ?Lloyd Huger, MD   06/19/2021 6:14 AM ? ? ? ? ?

## 2021-06-21 ENCOUNTER — Inpatient Hospital Stay: Payer: Medicare Other | Admitting: Oncology

## 2021-06-21 ENCOUNTER — Inpatient Hospital Stay: Payer: Medicare Other

## 2021-06-21 DIAGNOSIS — D472 Monoclonal gammopathy: Secondary | ICD-10-CM

## 2021-09-05 ENCOUNTER — Ambulatory Visit: Payer: Self-pay | Admitting: *Deleted

## 2021-09-13 ENCOUNTER — Other Ambulatory Visit: Payer: Self-pay | Admitting: *Deleted

## 2021-09-21 ENCOUNTER — Other Ambulatory Visit: Payer: Self-pay | Admitting: *Deleted

## 2021-09-21 NOTE — Patient Outreach (Addendum)
[facility]) Care Management  09/21/2021  [address] 03-16-1935 [id number]   RN Health Coach telephone call to patient.  [place] compliance verified. Patient fasting blood sugar is 127. Her A1C is 6.8. She is taking her medications as per ordered. Goal met with A1C <7.0  Plan: Case closure Case closure sent to Patient Case closure sent to PCP Certificate of completion sent to patient  [place] Management [phone]

## 2021-11-11 ENCOUNTER — Encounter (INDEPENDENT_AMBULATORY_CARE_PROVIDER_SITE_OTHER): Payer: Self-pay | Admitting: Nurse Practitioner

## 2021-11-11 ENCOUNTER — Ambulatory Visit (INDEPENDENT_AMBULATORY_CARE_PROVIDER_SITE_OTHER): Payer: Medicare Other | Admitting: Nurse Practitioner

## 2021-11-11 VITALS — BP 149/75 | HR 89 | Resp 16 | Wt 177.6 lb

## 2021-11-11 DIAGNOSIS — E1122 Type 2 diabetes mellitus with diabetic chronic kidney disease: Secondary | ICD-10-CM | POA: Diagnosis not present

## 2021-11-11 DIAGNOSIS — N1832 Chronic kidney disease, stage 3b: Secondary | ICD-10-CM

## 2021-11-11 DIAGNOSIS — N183 Chronic kidney disease, stage 3 unspecified: Secondary | ICD-10-CM

## 2021-11-11 DIAGNOSIS — M7989 Other specified soft tissue disorders: Secondary | ICD-10-CM

## 2021-11-11 NOTE — Progress Notes (Signed)
Subjective:    Patient ID: Sherry [name], female    DOB: March 07, 1935, 87 y.o.   MRN: [id number] Chief Complaint  Patient presents with   New Patient (Initial Visit)    Ref [facility] consult bilateral le edema    Patient is seen for evaluation of leg swelling. The patient first noticed the swelling remotely but is now concerned because of a significant increase in the overall edema. The swelling isn't associated with significant pain.  There has been an increasing amount of  discoloration noted by the patient. The patient notes that in the morning the legs are improved but they steadily worsened throughout the course of the day. Elevation seems to make the swelling of the legs better, dependency makes them much worse.   There is no history of ulcerations associated with the swelling.   The patient denies any recent changes in their medications.  The patient has not been wearing graduated compression.  The patient has no had any past angiography, interventions or vascular surgery.  The patient denies a history of DVT or PE. There is no prior history of phlebitis. There is no history of primary lymphedema.  There is no history of radiation treatment to the groin or pelvis No history of malignancies. No history of trauma or groin or pelvic surgery. No history of foreign travel or parasitic infections area       Review of Systems  Cardiovascular:  Positive for leg swelling.  Musculoskeletal:  Positive for arthralgias.  All other systems reviewed and are negative.      Objective:   Physical Exam Vitals reviewed.  HENT:     Head: Normocephalic.  Cardiovascular:     Rate and Rhythm: Normal rate.  Pulmonary:     Effort: Pulmonary effort is normal.  Musculoskeletal:     Right lower leg: Edema present.     Left lower leg: Edema present.  Skin:    General: Skin is warm and dry.  Neurological:     Mental Status: She is alert and oriented to person, place, and time.      Motor: Weakness present.     Gait: Gait abnormal.  Psychiatric:        Mood and Affect: Mood normal.        Behavior: Behavior normal.        Thought Content: Thought content normal.        Judgment: Judgment normal.     BP (!) 149/75 (BP Location: Right Arm)   Pulse 89   Resp 16   Wt 177 lb 9.6 oz (80.6 kg)   BMI 28.67 kg/m   Past Medical History:  Diagnosis Date   Allergy    Diabetes mellitus without complication (HCC)    Renal insufficiency    Thyroid disease     Social History   Socioeconomic History   Marital status: Widowed    Spouse name: Not on file   Number of children: 2   Years of education: 12   Highest education level: High school graduate  Occupational History   Occupation: Retired  Tobacco Use   Smoking status: Former    Types: Cigarettes    Quit date: 2004    Years since quitting: 19.6   Smokeless tobacco: Never  Vaping Use   Vaping Use: Never used  Substance and Sexual Activity   Alcohol use: Not Currently   Drug use: Not Currently   Sexual activity: Never  Other Topics Concern   Not on file  Social History Narrative   Not on file   Social Determinants of Health   Financial Resource Strain: Low Risk  (06/04/2019)   Overall Financial Resource Strain (CARDIA)    Difficulty of Paying Living Expenses: Not very hard  Food Insecurity: No Food Insecurity (05/26/2021)   Hunger Vital Sign    Worried About Running Out of Food in the Last Year: Never true    Ran Out of Food in the Last Year: Never true  Transportation Needs: No Transportation Needs (09/07/2020)   PRAPARE - Hydrologist (Medical): No    Lack of Transportation (Non-Medical): No  Physical Activity: Inactive (06/04/2019)   Exercise Vital Sign    Days of Exercise per Week: 0 days    Minutes of Exercise per Session: 0 min  Stress: No Stress Concern Present (06/04/2019)   [place]    Feeling  of Stress : Only a little  Social Connections: Moderately Integrated (06/04/2019)   Social Connection and Isolation Panel [NHANES]    Frequency of Communication with Friends and Family: More than three times a week    Frequency of Social Gatherings with Friends and Family: More than three times a week    Attends Religious Services: More than 4 times per year    Active Member of [employer] or Organizations: Yes    Attends Archivist Meetings: More than 4 times per year    Marital Status: Widowed  Intimate Partner Violence: Not At Risk (06/04/2019)   Humiliation, Afraid, Rape, and Kick questionnaire    Fear of Current or Ex-Partner: No    Emotionally Abused: No    Physically Abused: No    Sexually Abused: No    Past Surgical History:  Procedure Laterality Date   CHOLECYSTECTOMY      Family History  Problem Relation Age of Onset   Diabetes Mother    Hypertension Mother    Hypertension Father    Cancer Brother     Allergies  Allergen Reactions   Penicillins Swelling and Rash    Has patient had a PCN reaction causing immediate rash, facial/tongue/throat swelling, SOB or lightheadedness with hypotension: No Has patient had a PCN reaction causing severe rash involving mucus membranes or skin necrosis: No Has patient had a PCN reaction that required hospitalization: No Has patient had a PCN reaction occurring within the last 10 years: No If all of the above answers are "NO", then may proceed with Cephalosporin use.       Latest Ref Rng & Units 12/21/2020    1:03 PM 06/08/2020    1:02 PM 02/27/2020   12:47 PM  CBC  WBC 4.0 - 10.5 K/uL 8.4  6.5  7.3   Hemoglobin 12.0 - 15.0 g/dL 11.1  11.3  10.8   Hematocrit 36.0 - 46.0 % 35.5  34.7  33.6   Platelets 150 - 400 K/uL 316  258  258       CMP     Component Value Date/Time   NA 136 12/21/2020 1303   NA 139 06/19/2014 1924   K 4.0 12/21/2020 1303   K 4.2 06/19/2014 1924   CL 103 12/21/2020 1303   CL 103 06/19/2014 1924    CO2 25 12/21/2020 1303   CO2 29 06/19/2014 1924   GLUCOSE 175 (H) 12/21/2020 1303   GLUCOSE 156 (H) 06/19/2014 1924   BUN 33 (H) 12/21/2020 1303   BUN 33 (H) 06/19/2014 1924  CREATININE 1.43 (H) 12/21/2020 1303   CREATININE 1.65 (H) 06/19/2014 1924   CALCIUM 10.5 (H) 12/21/2020 1303   CALCIUM 11.0 (H) 06/19/2014 1924   PROT 6.8 03/27/2017 1857   PROT 5.8 (L) 08/12/2013 0451   ALBUMIN 3.5 03/27/2017 1857   ALBUMIN 2.8 (L) 08/12/2013 0451   AST 16 03/27/2017 1857   AST 24 08/12/2013 0451   ALT 12 (L) 03/27/2017 1857   ALT 57 08/12/2013 0451   ALKPHOS 53 03/27/2017 1857   ALKPHOS 78 08/12/2013 0451   BILITOT 1.3 (H) 03/27/2017 1857   BILITOT 0.3 08/12/2013 0451   GFRNONAA 36 (L) 12/21/2020 1303   GFRNONAA 29 (L) 06/19/2014 1924   GFRAA 42 (L) 11/13/2019 1446   GFRAA 34 (L) 06/19/2014 1924     No results found.     Assessment & Plan:   1. Leg swelling Recommend:  I have had a long discussion with the patient regarding swelling and why it  causes symptoms.  Patient will begin wearing graduated compression on a daily basis a prescription was given. The patient will  wear the stockings first thing in the morning and removing them in the evening. The patient is instructed specifically not to sleep in the stockings.   In addition, behavioral modification will be initiated.  This will include frequent elevation, use of over the counter pain medications and exercise such as walking.  Consideration for a lymph pump will also be made based upon the effectiveness of conservative therapy.  This would help to improve the edema control and prevent sequela such as ulcers and infections   Patient should undergo duplex ultrasound of the venous system to ensure that DVT or reflux is not present.  The patient will follow-up with me after the ultrasound.    2. Stage 3b chronic kidney disease ([place]) This may also contribute to patient's lower extremity edema  3. Diabetes mellitus with  stage 3 chronic kidney disease (HCC) Continue hypoglycemic medications as already ordered, these medications have been reviewed and there are no changes at this time.  Hgb A1C to be monitored as already arranged by primary service    Current Outpatient Medications on File Prior to Visit  Medication Sig Dispense Refill   ACCU-CHEK AVIVA PLUS test strip      albuterol (VENTOLIN HFA) 108 (90 Base) MCG/ACT inhaler Inhale 2 puffs into the lungs every 6 (six) hours as needed.     atenolol (TENORMIN) 25 MG tablet Take 25 mg by mouth daily.     Blood Glucose Calibration (ACCU-CHEK AVIVA) SOLN      Blood Glucose Monitoring Suppl (ACCU-CHEK AVIVA PLUS) w/Device KIT See admin instructions.     COVID-19 mRNA bivalent vaccine, Pfizer, (PFIZER COVID-19 VAC BIVALENT) injection Inject into the muscle. 0.3 mL 0   diclofenac Sodium (VOLTAREN) 1 % GEL Apply 2 g topically as needed.     estradiol (ESTRACE) 0.5 MG tablet Take 0.5 mg by mouth daily.      influenza vaccine adjuvanted (FLUAD QUADRIVALENT) 0.5 ML injection Inject into the muscle. 0.5 mL 0   levothyroxine (SYNTHROID, LEVOTHROID) 25 MCG tablet TAKE 25 mcg TABLET EVERY DAY ON AN EMPTY STOMACH WITH A GLASS OF WATER AT LEAST 30 TO 60 MINUTES BEFORE BREAKFAST (Patient taking differently: Take 25 mcg by mouth daily before breakfast.)     losartan (COZAAR) 50 MG tablet Take 50 mg by mouth daily.     metFORMIN (GLUCOPHAGE) 500 MG tablet TAKE 500 mg TABLET TWICE DAILY WITH MEALS  Do not take on 1/10 or 03/30/17 due to contrast given for CT scan. Restart taking on 03/31/17. (Patient taking differently: Take 500 mg by mouth 2 (two) times daily with a meal.)     omeprazole (PRILOSEC) 40 MG capsule Take 40 mg by mouth daily.     progesterone (PROMETRIUM) 200 MG capsule Take 200 mg by mouth at bedtime.      traMADol (ULTRAM) 50 MG tablet Take 50 mg by mouth every 12 (twelve) hours as needed for moderate pain.     Bromfenac Sodium 0.07 % SOLN Place 1 drop into both  eyes daily. (Patient not taking: Reported on 02/27/2020)     fluconazole (DIFLUCAN) 150 MG tablet Take 150 mg by mouth once. (Patient not taking: Reported on 12/21/2020)     furosemide (LASIX) 40 MG tablet Take 20 mg by mouth daily as needed for fluid or edema (for ankle swelling). (Patient not taking: Reported on 12/21/2020)     gabapentin (NEURONTIN) 400 MG capsule Take 400 mg by mouth daily. (Patient not taking: Reported on 11/11/2021)     montelukast (SINGULAIR) 10 MG tablet Take 10 mg by mouth daily. (Patient not taking: Reported on 06/08/2020)     No current facility-administered medications on file prior to visit.    There are no Patient Instructions on file for this visit. No follow-ups on file.   Kris Hartmann, NP

## 2021-12-28 ENCOUNTER — Other Ambulatory Visit (INDEPENDENT_AMBULATORY_CARE_PROVIDER_SITE_OTHER): Payer: Self-pay | Admitting: Nurse Practitioner

## 2021-12-28 DIAGNOSIS — M7989 Other specified soft tissue disorders: Secondary | ICD-10-CM

## 2022-01-02 ENCOUNTER — Ambulatory Visit (INDEPENDENT_AMBULATORY_CARE_PROVIDER_SITE_OTHER): Payer: Medicare Other

## 2022-01-02 ENCOUNTER — Encounter (INDEPENDENT_AMBULATORY_CARE_PROVIDER_SITE_OTHER): Payer: Self-pay | Admitting: Nurse Practitioner

## 2022-01-02 ENCOUNTER — Ambulatory Visit (INDEPENDENT_AMBULATORY_CARE_PROVIDER_SITE_OTHER): Payer: Medicare Other | Admitting: Nurse Practitioner

## 2022-01-02 VITALS — BP 131/77 | HR 91 | Resp 18 | Ht 65.0 in | Wt 176.0 lb

## 2022-01-02 DIAGNOSIS — M48062 Spinal stenosis, lumbar region with neurogenic claudication: Secondary | ICD-10-CM

## 2022-01-02 DIAGNOSIS — I890 Lymphedema, not elsewhere classified: Secondary | ICD-10-CM | POA: Diagnosis not present

## 2022-01-02 DIAGNOSIS — I10 Essential (primary) hypertension: Secondary | ICD-10-CM | POA: Diagnosis not present

## 2022-01-02 DIAGNOSIS — E1122 Type 2 diabetes mellitus with diabetic chronic kidney disease: Secondary | ICD-10-CM

## 2022-01-02 DIAGNOSIS — M7989 Other specified soft tissue disorders: Secondary | ICD-10-CM

## 2022-01-02 DIAGNOSIS — N183 Chronic kidney disease, stage 3 unspecified: Secondary | ICD-10-CM

## 2022-01-08 ENCOUNTER — Encounter (INDEPENDENT_AMBULATORY_CARE_PROVIDER_SITE_OTHER): Payer: Self-pay | Admitting: Nurse Practitioner

## 2022-01-08 NOTE — Progress Notes (Addendum)
Subjective:    Patient ID: Sherry [name], female    DOB: 1934/11/22, 87 y.o.   MRN: [id number] Chief Complaint  Patient presents with   Follow-up    ultrasound    The patient returns to the office for followup evaluation regarding leg swelling.  The swelling has persisted and the pain associated with swelling continues. There have not been any interval development of a ulcerations or wounds.  The patient returns today for noninvasive studies for further evaluation of the leg swelling.  She also endorses having some balance issues and has known back issues.  Since the previous visit the patient has been wearing graduated compression stockings and has noted little if any improvement in the lymphedema. The patient has been using compression routinely morning until night.  The patient also states elevation during the day and exercise is being done too.    Noninvasive studies show no evidence of DVT or superficial thrombophlebitis bilaterally.  No evidence of deep venous insufficiency.  There is evidence of reflux in the left small saphenous vein.  No evidence of reflux in the left lower extremity.     Review of Systems  Cardiovascular:  Positive for leg swelling.  Musculoskeletal:  Positive for back pain.  Neurological:  Positive for weakness.  All other systems reviewed and are negative.      Objective:   Physical Exam Vitals reviewed.  HENT:     Head: Normocephalic.  Cardiovascular:     Rate and Rhythm: Normal rate.  Pulmonary:     Effort: Pulmonary effort is normal.  Musculoskeletal:     Right lower leg: 2+ Edema present.     Left lower leg: 2+ Edema present.  Skin:    General: Skin is warm and dry.     Comments: Dermal thickening bilaterally with stasis dermatitis  Hyperpigmentation present   Neurological:     Mental Status: She is alert and oriented to person, place, and time.     Motor: Weakness present.     Gait: Gait abnormal.  Psychiatric:        Mood and  Affect: Mood normal.        Behavior: Behavior normal.        Thought Content: Thought content normal.        Judgment: Judgment normal.     BP 131/77 (BP Location: Left Arm)   Pulse 91   Resp 18   Ht _0  (1.651 m)   Wt 176 lb (79.8 kg)   BMI 29.29 kg/m   Past Medical History:  Diagnosis Date   Allergy    Diabetes mellitus without complication ([place])    Renal insufficiency    Thyroid disease     Social History   Socioeconomic History   Marital status: Widowed    Spouse name: Not on file   Number of children: 2   Years of education: 12   Highest education level: High school graduate  Occupational History   Occupation: Retired  Tobacco Use   Smoking status: Former    Types: Cigarettes    Quit date: 2004    Years since quitting: 19.8   Smokeless tobacco: Never  Vaping Use   Vaping Use: Never used  Substance and Sexual Activity   Alcohol use: Not Currently   Drug use: Not Currently   Sexual activity: Never  Other Topics Concern   Not on file  Social History Narrative   Not on file   Social Determinants of Health  Financial Resource Strain: Low Risk  (06/04/2019)   Overall Financial Resource Strain (CARDIA)    Difficulty of Paying Living Expenses: Not very hard  Food Insecurity: No Food Insecurity (05/26/2021)   Hunger Vital Sign    Worried About Running Out of Food in the Last Year: Never true    Ran Out of Food in the Last Year: Never true  Transportation Needs: No Transportation Needs (09/07/2020)   PRAPARE - Hydrologist (Medical): No    Lack of Transportation (Non-Medical): No  Physical Activity: Inactive (06/04/2019)   Exercise Vital Sign    Days of Exercise per Week: 0 days    Minutes of Exercise per Session: 0 min  Stress: No Stress Concern Present (06/04/2019)   [place]    Feeling of Stress : Only a little  Social Connections: Moderately Integrated  (06/04/2019)   Social Connection and Isolation Panel [NHANES]    Frequency of Communication with Friends and Family: More than three times a week    Frequency of Social Gatherings with Friends and Family: More than three times a week    Attends Religious Services: More than 4 times per year    Active Member of [employer] or Organizations: Yes    Attends Archivist Meetings: More than 4 times per year    Marital Status: Widowed  Intimate Partner Violence: Not At Risk (06/04/2019)   Humiliation, Afraid, Rape, and Kick questionnaire    Fear of Current or Ex-Partner: No    Emotionally Abused: No    Physically Abused: No    Sexually Abused: No    Past Surgical History:  Procedure Laterality Date   CHOLECYSTECTOMY      Family History  Problem Relation Age of Onset   Diabetes Mother    Hypertension Mother    Hypertension Father    Cancer Brother     Allergies  Allergen Reactions   Penicillins Swelling and Rash    Has patient had a PCN reaction causing immediate rash, facial/tongue/throat swelling, SOB or lightheadedness with hypotension: No Has patient had a PCN reaction causing severe rash involving mucus membranes or skin necrosis: No Has patient had a PCN reaction that required hospitalization: No Has patient had a PCN reaction occurring within the last 10 years: No If all of the above answers are "NO", then may proceed with Cephalosporin use.       Latest Ref Rng & Units 12/21/2020    1:03 PM 06/08/2020    1:02 PM 02/27/2020   12:47 PM  CBC  WBC 4.0 - 10.5 K/uL 8.4  6.5  7.3   Hemoglobin 12.0 - 15.0 g/dL 11.1  11.3  10.8   Hematocrit 36.0 - 46.0 % 35.5  34.7  33.6   Platelets 150 - 400 K/uL 316  258  258       CMP     Component Value Date/Time   NA 136 12/21/2020 1303   NA 139 06/19/2014 1924   K 4.0 12/21/2020 1303   K 4.2 06/19/2014 1924   CL 103 12/21/2020 1303   CL 103 06/19/2014 1924   CO2 25 12/21/2020 1303   CO2 29 06/19/2014 1924   GLUCOSE 175 (H)  12/21/2020 1303   GLUCOSE 156 (H) 06/19/2014 1924   BUN 33 (H) 12/21/2020 1303   BUN 33 (H) 06/19/2014 1924   CREATININE 1.43 (H) 12/21/2020 1303   CREATININE 1.65 (H) 06/19/2014 1924  CALCIUM 10.5 (H) 12/21/2020 1303   CALCIUM 11.0 (H) 06/19/2014 1924   PROT 6.8 03/27/2017 1857   PROT 5.8 (L) 08/12/2013 0451   ALBUMIN 3.5 03/27/2017 1857   ALBUMIN 2.8 (L) 08/12/2013 0451   AST 16 03/27/2017 1857   AST 24 08/12/2013 0451   ALT 12 (L) 03/27/2017 1857   ALT 57 08/12/2013 0451   ALKPHOS 53 03/27/2017 1857   ALKPHOS 78 08/12/2013 0451   BILITOT 1.3 (H) 03/27/2017 1857   BILITOT 0.3 08/12/2013 0451   GFRNONAA 36 (L) 12/21/2020 1303   GFRNONAA 29 (L) 06/19/2014 1924   GFRAA 42 (L) 11/13/2019 1446   GFRAA 34 (L) 06/19/2014 1924     No results found.     Assessment & Plan:   1. Lymphedema Recommend:  No surgery or intervention at this point in time.   The Patient is CEAP [id number].  The patient has been wearing compression for more than 12 weeks with no or little benefit.  The patient has been exercising daily for more than 12 weeks. The patient has been elevating and taking OTC pain medications for more than 12 weeks.  None of these have have eliminated the pain related to the lymphedema or the discomfort regarding excessive swelling and venous congestion.    I have reviewed my discussion with the patient regarding lymphedema and why it  causes symptoms.  Patient will continue wearing graduated compression on a daily basis. The patient should put the compression on first thing in the morning and removing them in the evening. The patient should not sleep in the compression.   In addition, behavioral modification throughout the day will be continued.  This will include frequent elevation (such as in a recliner), use of over the counter pain medications as needed and exercise such as walking.  The systemic causes for chronic edema such as liver, kidney and cardiac etiologies do not  appear to have significant changed over the past year.    The patient has chronic , severe lymphedema with hyperpigmentation of the skin and has done MLD, skin care, medication, diet, exercise, elevation and compression for 4 weeks with no improvement,  I am recommending a lymphedema pump.  The patient still has stage 3 lymphedema and therefore, I believe that a lymph pump is needed to improve the control of the patient's lymphedema and improve the quality of life.  Additionally, a lymph pump is warranted because it will reduce the risk of cellulitis and ulceration in the future.  Patient should follow-up in six months  - VAS [place] LOWER EXTREMITY VENOUS REFLUX  2. Lumbar stenosis with neurogenic claudication This likely plays a factor in the patient's balance and lower extremity pain issues.  3. Diabetes mellitus with stage 3 chronic kidney disease ([place]) Continue hypoglycemic medications as already ordered, these medications have been reviewed and there are no changes at this time.  Hgb A1C to be monitored as already arranged by primary service   4. HTN (hypertension), benign Continue antihypertensive medications as already ordered, these medications have been reviewed and there are no changes at this time.    Current Outpatient Medications on File Prior to Visit  Medication Sig Dispense Refill   ACCU-CHEK AVIVA PLUS test strip      albuterol (VENTOLIN HFA) 108 (90 Base) MCG/ACT inhaler Inhale 2 puffs into the lungs every 6 (six) hours as needed.     atenolol (TENORMIN) 25 MG tablet Take 25 mg by mouth daily.     Blood  Glucose Calibration (ACCU-CHEK AVIVA) SOLN      Blood Glucose Monitoring Suppl (ACCU-CHEK AVIVA PLUS) w/Device KIT See admin instructions.     cetirizine (ZYRTEC) 10 MG tablet Take 10 mg by mouth daily.     diclofenac Sodium (VOLTAREN) 1 % GEL Apply 2 g topically as needed.     estradiol (ESTRACE) 0.5 MG tablet Take 0.5 mg by mouth daily.      levothyroxine (SYNTHROID,  LEVOTHROID) 25 MCG tablet TAKE 25 mcg TABLET EVERY DAY ON AN EMPTY STOMACH WITH A GLASS OF WATER AT LEAST 30 TO 60 MINUTES BEFORE BREAKFAST (Patient taking differently: Take 25 mcg by mouth daily before breakfast.)     losartan (COZAAR) 50 MG tablet Take 50 mg by mouth daily.     metFORMIN (GLUCOPHAGE) 500 MG tablet TAKE 500 mg TABLET TWICE DAILY WITH MEALS  Do not take on 1/10 or 03/30/17 due to contrast given for CT scan. Restart taking on 03/31/17. (Patient taking differently: Take 500 mg by mouth 2 (two) times daily with a meal.)     omeprazole (PRILOSEC) 40 MG capsule Take 40 mg by mouth daily.     progesterone (PROMETRIUM) 200 MG capsule Take 200 mg by mouth at bedtime.      traMADol (ULTRAM) 50 MG tablet Take 50 mg by mouth every 12 (twelve) hours as needed for moderate pain.     Bromfenac Sodium 0.07 % SOLN Place 1 drop into both eyes daily. (Patient not taking: Reported on 02/27/2020)     COVID-19 mRNA bivalent vaccine, Pfizer, (PFIZER COVID-19 VAC BIVALENT) injection Inject into the muscle. (Patient not taking: Reported on 01/02/2022) 0.3 mL 0   fluconazole (DIFLUCAN) 150 MG tablet Take 150 mg by mouth once. (Patient not taking: Reported on 12/21/2020)     furosemide (LASIX) 40 MG tablet Take 20 mg by mouth daily as needed for fluid or edema (for ankle swelling). (Patient not taking: Reported on 12/21/2020)     gabapentin (NEURONTIN) 400 MG capsule Take 400 mg by mouth daily. (Patient not taking: Reported on 11/11/2021)     influenza vaccine adjuvanted (FLUAD QUADRIVALENT) 0.5 ML injection Inject into the muscle. (Patient not taking: Reported on 01/02/2022) 0.5 mL 0   montelukast (SINGULAIR) 10 MG tablet Take 10 mg by mouth daily. (Patient not taking: Reported on 06/08/2020)     No current facility-administered medications on file prior to visit.    There are no Patient Instructions on file for this visit. No follow-ups on file.   Kris Hartmann, NP

## 2022-03-30 DIAGNOSIS — Z0000 Encounter for general adult medical examination without abnormal findings: Secondary | ICD-10-CM | POA: Diagnosis not present

## 2022-03-30 DIAGNOSIS — N1832 Chronic kidney disease, stage 3b: Secondary | ICD-10-CM | POA: Diagnosis not present

## 2022-03-30 DIAGNOSIS — E7800 Pure hypercholesterolemia, unspecified: Secondary | ICD-10-CM | POA: Diagnosis not present

## 2022-03-30 DIAGNOSIS — Z87891 Personal history of nicotine dependence: Secondary | ICD-10-CM | POA: Diagnosis not present

## 2022-03-30 DIAGNOSIS — E1122 Type 2 diabetes mellitus with diabetic chronic kidney disease: Secondary | ICD-10-CM | POA: Diagnosis not present

## 2022-03-30 DIAGNOSIS — E039 Hypothyroidism, unspecified: Secondary | ICD-10-CM | POA: Diagnosis not present

## 2022-03-30 DIAGNOSIS — N183 Chronic kidney disease, stage 3 unspecified: Secondary | ICD-10-CM | POA: Diagnosis not present

## 2022-03-30 DIAGNOSIS — R6889 Other general symptoms and signs: Secondary | ICD-10-CM | POA: Diagnosis not present

## 2022-03-30 DIAGNOSIS — I129 Hypertensive chronic kidney disease with stage 1 through stage 4 chronic kidney disease, or unspecified chronic kidney disease: Secondary | ICD-10-CM | POA: Diagnosis not present

## 2022-04-20 DIAGNOSIS — E782 Mixed hyperlipidemia: Secondary | ICD-10-CM | POA: Diagnosis not present

## 2022-04-20 DIAGNOSIS — N183 Chronic kidney disease, stage 3 unspecified: Secondary | ICD-10-CM | POA: Diagnosis not present

## 2022-04-20 DIAGNOSIS — R531 Weakness: Secondary | ICD-10-CM | POA: Diagnosis not present

## 2022-04-20 DIAGNOSIS — R600 Localized edema: Secondary | ICD-10-CM | POA: Diagnosis not present

## 2022-04-20 DIAGNOSIS — I2510 Atherosclerotic heart disease of native coronary artery without angina pectoris: Secondary | ICD-10-CM | POA: Diagnosis not present

## 2022-04-20 DIAGNOSIS — J449 Chronic obstructive pulmonary disease, unspecified: Secondary | ICD-10-CM | POA: Diagnosis not present

## 2022-04-20 DIAGNOSIS — R6889 Other general symptoms and signs: Secondary | ICD-10-CM | POA: Diagnosis not present

## 2022-04-20 DIAGNOSIS — R42 Dizziness and giddiness: Secondary | ICD-10-CM | POA: Diagnosis not present

## 2022-05-04 DIAGNOSIS — R600 Localized edema: Secondary | ICD-10-CM | POA: Diagnosis not present

## 2022-05-04 DIAGNOSIS — R6889 Other general symptoms and signs: Secondary | ICD-10-CM | POA: Diagnosis not present

## 2022-05-10 ENCOUNTER — Telehealth (INDEPENDENT_AMBULATORY_CARE_PROVIDER_SITE_OTHER): Payer: Self-pay

## 2022-05-10 NOTE — Telephone Encounter (Signed)
Ahsaki left a voicemail asking about the lymphatic pump for her leg, she was wondering if is was ordered of canceled. The number for [employer] was given.

## 2022-05-22 DIAGNOSIS — I129 Hypertensive chronic kidney disease with stage 1 through stage 4 chronic kidney disease, or unspecified chronic kidney disease: Secondary | ICD-10-CM | POA: Diagnosis not present

## 2022-05-22 DIAGNOSIS — N1831 Chronic kidney disease, stage 3a: Secondary | ICD-10-CM | POA: Diagnosis not present

## 2022-05-22 DIAGNOSIS — E118 Type 2 diabetes mellitus with unspecified complications: Secondary | ICD-10-CM | POA: Diagnosis not present

## 2022-05-22 DIAGNOSIS — D472 Monoclonal gammopathy: Secondary | ICD-10-CM | POA: Diagnosis not present

## 2022-05-22 DIAGNOSIS — R6889 Other general symptoms and signs: Secondary | ICD-10-CM | POA: Diagnosis not present

## 2022-06-02 ENCOUNTER — Other Ambulatory Visit: Payer: Self-pay

## 2022-06-02 DIAGNOSIS — D472 Monoclonal gammopathy: Secondary | ICD-10-CM

## 2022-06-05 ENCOUNTER — Inpatient Hospital Stay: Payer: Medicare HMO

## 2022-06-05 ENCOUNTER — Inpatient Hospital Stay: Payer: Medicare HMO | Admitting: Oncology

## 2022-06-14 ENCOUNTER — Inpatient Hospital Stay: Payer: Medicare HMO | Attending: Oncology

## 2022-06-14 ENCOUNTER — Inpatient Hospital Stay: Payer: Medicare HMO | Admitting: Oncology

## 2022-06-14 ENCOUNTER — Encounter: Payer: Self-pay | Admitting: Oncology

## 2022-06-14 VITALS — BP 112/81 | HR 67 | Temp 97.4°F | Resp 16 | Ht 65.0 in | Wt 177.0 lb

## 2022-06-14 DIAGNOSIS — D472 Monoclonal gammopathy: Secondary | ICD-10-CM | POA: Insufficient documentation

## 2022-06-14 DIAGNOSIS — G8929 Other chronic pain: Secondary | ICD-10-CM | POA: Diagnosis not present

## 2022-06-14 DIAGNOSIS — M549 Dorsalgia, unspecified: Secondary | ICD-10-CM | POA: Diagnosis not present

## 2022-06-14 DIAGNOSIS — E1122 Type 2 diabetes mellitus with diabetic chronic kidney disease: Secondary | ICD-10-CM | POA: Insufficient documentation

## 2022-06-14 DIAGNOSIS — Z87891 Personal history of nicotine dependence: Secondary | ICD-10-CM | POA: Diagnosis not present

## 2022-06-14 DIAGNOSIS — E079 Disorder of thyroid, unspecified: Secondary | ICD-10-CM | POA: Diagnosis not present

## 2022-06-14 DIAGNOSIS — R6889 Other general symptoms and signs: Secondary | ICD-10-CM | POA: Diagnosis not present

## 2022-06-14 DIAGNOSIS — Z79899 Other long term (current) drug therapy: Secondary | ICD-10-CM | POA: Diagnosis not present

## 2022-06-14 DIAGNOSIS — D649 Anemia, unspecified: Secondary | ICD-10-CM | POA: Diagnosis not present

## 2022-06-14 DIAGNOSIS — N189 Chronic kidney disease, unspecified: Secondary | ICD-10-CM | POA: Insufficient documentation

## 2022-06-14 DIAGNOSIS — Z7984 Long term (current) use of oral hypoglycemic drugs: Secondary | ICD-10-CM | POA: Diagnosis not present

## 2022-06-14 LAB — CBC WITH DIFFERENTIAL (CANCER CENTER ONLY)
Abs Immature Granulocytes: 0.07 10*3/uL (ref 0.00–0.07)
Basophils Absolute: 0.1 10*3/uL (ref 0.0–0.1)
Basophils Relative: 1 %
Eosinophils Absolute: 0.2 10*3/uL (ref 0.0–0.5)
Eosinophils Relative: 2 %
HCT: 35.5 % — ABNORMAL LOW (ref 36.0–46.0)
Hemoglobin: 11.3 g/dL — ABNORMAL LOW (ref 12.0–15.0)
Immature Granulocytes: 1 %
Lymphocytes Relative: 23 %
Lymphs Abs: 2.2 10*3/uL (ref 0.7–4.0)
MCH: 27.4 pg (ref 26.0–34.0)
MCHC: 31.8 g/dL (ref 30.0–36.0)
MCV: 86.2 fL (ref 80.0–100.0)
Monocytes Absolute: 0.8 10*3/uL (ref 0.1–1.0)
Monocytes Relative: 8 %
Neutro Abs: 6.2 10*3/uL (ref 1.7–7.7)
Neutrophils Relative %: 65 %
Platelet Count: 287 10*3/uL (ref 150–400)
RBC: 4.12 MIL/uL (ref 3.87–5.11)
RDW: 14.1 % (ref 11.5–15.5)
WBC Count: 9.5 10*3/uL (ref 4.0–10.5)
nRBC: 0 % (ref 0.0–0.2)

## 2022-06-14 LAB — BASIC METABOLIC PANEL - CANCER CENTER ONLY
Anion gap: 7 (ref 5–15)
BUN: 33 mg/dL — ABNORMAL HIGH (ref 8–23)
CO2: 24 mmol/L (ref 22–32)
Calcium: 9.9 mg/dL (ref 8.9–10.3)
Chloride: 104 mmol/L (ref 98–111)
Creatinine: 1.37 mg/dL — ABNORMAL HIGH (ref 0.44–1.00)
GFR, Estimated: 38 mL/min — ABNORMAL LOW (ref >60)
Glucose, Bld: 121 mg/dL — ABNORMAL HIGH (ref 70–99)
Potassium: 4 mmol/L (ref 3.5–5.1)
Sodium: 135 mmol/L (ref 135–145)

## 2022-06-14 NOTE — Progress Notes (Signed)
Sherry [name]  [phone] [phone]  ID: Sherry [name] OB: 1934-12-05  MR#: [id number]  [id number]  Patient Care Team: Baxter Hire, MD as PCP - General (Internal Medicine) Lloyd Huger, MD as Consulting Physician (Hematology and Oncology)   CHIEF COMPLAINT: MGUS  INTERVAL HISTORY: Patient returns to clinic today for routine yearly evaluation and discussion of her laboratory results.  She appears to have some venous stasis in her lower extremities making it difficult to walk, but otherwise feels well.  She has no neurologic complaints.  She has chronic back pain.  She has a good appetite and denies weight loss.  She has no chest pain, shortness of breath, cough, or hemoptysis.  She denies any nausea, vomiting, constipation, or diarrhea.  She has no urinary complaints.  Patient offers no further specific complaints today.  REVIEW OF SYSTEMS:   Review of Systems  Constitutional: Negative.  Negative for fever, malaise/fatigue and weight loss.  Respiratory: Negative.  Negative for cough, hemoptysis and shortness of breath.   Cardiovascular:  Positive for leg swelling. Negative for chest pain.  Gastrointestinal: Negative.  Negative for abdominal pain.  Genitourinary: Negative.  Negative for dysuria.  Musculoskeletal: Negative.  Negative for back pain.  Skin: Negative.  Negative for rash.  Neurological: Negative.  Negative for dizziness, focal weakness, weakness and headaches.  Psychiatric/Behavioral: Negative.  The patient is not nervous/anxious.     As per HPI. Otherwise, a complete review of systems is negative.  PAST MEDICAL HISTORY: Past Medical History:  Diagnosis Date   Allergy    Diabetes mellitus without complication ([place])    Renal insufficiency    Thyroid disease     PAST SURGICAL HISTORY: Past Surgical History:  Procedure Laterality Date   CHOLECYSTECTOMY      FAMILY HISTORY: Family History  Problem  Relation Age of Onset   Diabetes Mother    Hypertension Mother    Hypertension Father    Cancer Brother     ADVANCED DIRECTIVES (Y/N):  N  HEALTH MAINTENANCE: Social History   Tobacco Use   Smoking status: Former    Types: Cigarettes    Quit date: 2004    Years since quitting: 20.2   Smokeless tobacco: Never  Vaping Use   Vaping Use: Never used  Substance Use Topics   Alcohol use: Not Currently   Drug use: Not Currently     Colonoscopy:  PAP:  Bone density:  Lipid panel:  Allergies  Allergen Reactions   Penicillins Swelling and Rash    Has patient had a PCN reaction causing immediate rash, facial/tongue/throat swelling, SOB or lightheadedness with hypotension: No Has patient had a PCN reaction causing severe rash involving mucus membranes or skin necrosis: No Has patient had a PCN reaction that required hospitalization: No Has patient had a PCN reaction occurring within the last 10 years: No If all of the above answers are "NO", then may proceed with Cephalosporin use.    Current Outpatient Medications  Medication Sig Dispense Refill   ACCU-CHEK AVIVA PLUS test strip      albuterol (VENTOLIN HFA) 108 (90 Base) MCG/ACT inhaler Inhale 2 puffs into the lungs every 6 (six) hours as needed.     atenolol (TENORMIN) 25 MG tablet Take 25 mg by mouth daily.     Blood Glucose Calibration (ACCU-CHEK AVIVA) SOLN      Blood Glucose Monitoring Suppl (ACCU-CHEK AVIVA PLUS) w/Device KIT See admin instructions.     diclofenac Sodium (VOLTAREN) 1 %  GEL Apply 2 g topically as needed.     estradiol (ESTRACE) 0.5 MG tablet Take 0.5 mg by mouth daily.      gabapentin (NEURONTIN) 400 MG capsule Take 400 mg by mouth daily.     levothyroxine (SYNTHROID, LEVOTHROID) 25 MCG tablet TAKE 25 mcg TABLET EVERY DAY ON AN EMPTY STOMACH WITH A GLASS OF WATER AT LEAST 30 TO 60 MINUTES BEFORE BREAKFAST (Patient taking differently: Take 25 mcg by mouth daily before breakfast.)     losartan (COZAAR) 50  MG tablet Take 50 mg by mouth daily.     metFORMIN (GLUCOPHAGE) 500 MG tablet TAKE 500 mg TABLET TWICE DAILY WITH MEALS  Do not take on 1/10 or 03/30/17 due to contrast given for CT scan. Restart taking on 03/31/17. (Patient taking differently: Take 500 mg by mouth 2 (two) times daily with a meal.)     progesterone (PROMETRIUM) 200 MG capsule Take 200 mg by mouth at bedtime.      traMADol (ULTRAM) 50 MG tablet Take 50 mg by mouth every 12 (twelve) hours as needed for moderate pain.     Bromfenac Sodium 0.07 % SOLN Place 1 drop into both eyes daily. (Patient not taking: Reported on 02/27/2020)     cetirizine (ZYRTEC) 10 MG tablet Take 10 mg by mouth daily. (Patient not taking: Reported on 06/14/2022)     COVID-19 mRNA bivalent vaccine, Pfizer, (PFIZER COVID-19 VAC BIVALENT) injection Inject into the muscle. (Patient not taking: Reported on 01/02/2022) 0.3 mL 0   fluconazole (DIFLUCAN) 150 MG tablet Take 150 mg by mouth once. (Patient not taking: Reported on 12/21/2020)     furosemide (LASIX) 40 MG tablet Take 20 mg by mouth daily as needed for fluid or edema (for ankle swelling). (Patient not taking: Reported on 12/21/2020)     influenza vaccine adjuvanted (FLUAD QUADRIVALENT) 0.5 ML injection Inject into the muscle. (Patient not taking: Reported on 01/02/2022) 0.5 mL 0   montelukast (SINGULAIR) 10 MG tablet Take 10 mg by mouth daily. (Patient not taking: Reported on 06/08/2020)     omeprazole (PRILOSEC) 40 MG capsule Take 40 mg by mouth daily. (Patient not taking: Reported on 06/14/2022)     No current facility-administered medications for this visit.    OBJECTIVE: Vitals:   06/14/22 1045  BP: 112/81  Pulse: 67  Resp: 16  Temp: (!) 97.4 F (36.3 C)  SpO2: 100%     Body mass index is 29.45 kg/m.    ECOG FS:0 - Asymptomatic  General: Well-developed, well-nourished, no acute distress.  Sitting in a wheelchair. Eyes: Pink conjunctiva, anicteric sclera. HEENT: Normocephalic, moist mucous  membranes. Lungs: No audible wheezing or coughing. Heart: Regular rate and rhythm. Abdomen: Soft, nontender, no obvious distention. Musculoskeletal: No edema, cyanosis, or clubbing. Neuro: Alert, answering all questions appropriately. Cranial nerves grossly intact. Skin: No rashes or petechiae noted. Psych: Normal affect.  LAB RESULTS:  Lab Results  Component Value Date   NA 135 06/14/2022   K 4.0 06/14/2022   CL 104 06/14/2022   CO2 24 06/14/2022   GLUCOSE 121 (H) 06/14/2022   BUN 33 (H) 06/14/2022   CREATININE 1.37 (H) 06/14/2022   CALCIUM 9.9 06/14/2022   PROT 6.8 03/27/2017   ALBUMIN 3.5 03/27/2017   AST 16 03/27/2017   ALT 12 (L) 03/27/2017   ALKPHOS 53 03/27/2017   BILITOT 1.3 (H) 03/27/2017   GFRNONAA 38 (L) 06/14/2022   GFRAA 42 (L) 11/13/2019    Lab Results  Component Value Date  WBC 9.5 06/14/2022   NEUTROABS 6.2 06/14/2022   HGB 11.3 (L) 06/14/2022   HCT 35.5 (L) 06/14/2022   MCV 86.2 06/14/2022   PLT 287 06/14/2022     STUDIES: No results found.  ASSESSMENT: MGUS.  PLAN:    MGUS: Patient's M spike has ranged from 0.4-0.7 since August 2021.  Today's results are pending.  Kappa free light chains have ranged from 31.0-57.6 over the same timeframe.  Immunoglobulins are within normal limits.  She has a mild anemia and chronic renal insufficiency that are both unchanged as well.  No intervention is needed.  If patient's laboratory work from today results in approximately her baseline, patient can likely be discharged from clinic. Anemia: Chronic and unchanged.  Patient hemoglobin is 11.3. Chronic renal insufficiency: Chronic and unchanged.  Patient's most recent creatinine is 1.31. History of hypercalcemia: Resolved. Pulmonary nodule: Patient noted to have a 1.3 cm groundglass opacity in the left upper lobe on CT scan from December 18, 2019.  Does not appear to be any additional imaging.  Recommend PCP follow-up as indicated.  Patient expressed  understanding and was in agreement with this plan. She also understands that She can call clinic at any time with any questions, concerns, or complaints.    Lloyd Huger, MD   06/14/2022 12:11 PM

## 2022-06-15 LAB — KAPPA/LAMBDA LIGHT CHAINS
Kappa free light chain: 65.5 mg/L — ABNORMAL HIGH (ref 3.3–19.4)
Kappa, lambda light chain ratio: 3.1 — ABNORMAL HIGH (ref 0.26–1.65)
Lambda free light chains: 21.1 mg/L (ref 5.7–26.3)

## 2022-06-15 LAB — IGG, IGA, IGM
IgA: 168 mg/dL (ref 64–422)
IgG (Immunoglobin G), Serum: 1422 mg/dL (ref 586–1602)
IgM (Immunoglobulin M), Srm: 72 mg/dL (ref 26–217)

## 2022-06-16 LAB — PROTEIN ELECTROPHORESIS, SERUM
A/G Ratio: 1 (ref 0.7–1.7)
Albumin ELP: 3.5 g/dL (ref 2.9–4.4)
Alpha-1-Globulin: 0.2 g/dL (ref 0.0–0.4)
Alpha-2-Globulin: 0.9 g/dL (ref 0.4–1.0)
Beta Globulin: 1 g/dL (ref 0.7–1.3)
Gamma Globulin: 1.3 g/dL (ref 0.4–1.8)
Globulin, Total: 3.4 g/dL (ref 2.2–3.9)
M-Spike, %: 0.5 g/dL — ABNORMAL HIGH
Total Protein ELP: 6.9 g/dL (ref 6.0–8.5)

## 2022-06-26 DIAGNOSIS — R6889 Other general symptoms and signs: Secondary | ICD-10-CM | POA: Diagnosis not present

## 2022-06-29 ENCOUNTER — Telehealth (INDEPENDENT_AMBULATORY_CARE_PROVIDER_SITE_OTHER): Payer: Self-pay

## 2022-06-29 NOTE — Telephone Encounter (Signed)
Spoke with the patient and she has called stating that [employer] came out and measured her and showed how to use the lymph pump and told her that when the insurance company authorizes the pump they will bring it out. The patient states they have not brought the pump out because they said we need to get authorization. I explained that once the referral for the pump has been sent in, [employer] is in complete control of getting auth, maintaining and care of the pump and the information. I have resent this info to [employer].

## 2022-06-30 DIAGNOSIS — E1122 Type 2 diabetes mellitus with diabetic chronic kidney disease: Secondary | ICD-10-CM | POA: Diagnosis not present

## 2022-06-30 DIAGNOSIS — M25561 Pain in right knee: Secondary | ICD-10-CM | POA: Diagnosis not present

## 2022-06-30 DIAGNOSIS — Z87891 Personal history of nicotine dependence: Secondary | ICD-10-CM | POA: Diagnosis not present

## 2022-06-30 DIAGNOSIS — M25562 Pain in left knee: Secondary | ICD-10-CM | POA: Diagnosis not present

## 2022-06-30 DIAGNOSIS — G8929 Other chronic pain: Secondary | ICD-10-CM | POA: Diagnosis not present

## 2022-06-30 DIAGNOSIS — N183 Chronic kidney disease, stage 3 unspecified: Secondary | ICD-10-CM | POA: Diagnosis not present

## 2022-06-30 DIAGNOSIS — E039 Hypothyroidism, unspecified: Secondary | ICD-10-CM | POA: Diagnosis not present

## 2022-06-30 DIAGNOSIS — N1832 Chronic kidney disease, stage 3b: Secondary | ICD-10-CM | POA: Diagnosis not present

## 2022-06-30 DIAGNOSIS — I129 Hypertensive chronic kidney disease with stage 1 through stage 4 chronic kidney disease, or unspecified chronic kidney disease: Secondary | ICD-10-CM | POA: Diagnosis not present

## 2022-06-30 DIAGNOSIS — E7800 Pure hypercholesterolemia, unspecified: Secondary | ICD-10-CM | POA: Diagnosis not present

## 2022-07-03 NOTE — Telephone Encounter (Signed)
This was sent from [facility] on 06/29/22 in response to me sending over information regarding the pump on this patient:  HI [place] and [place]. I wanted to let you know that this patient has switched to [facility] HMO plans are no longer allowing any vendors like [employer]. to process lymphedema pumps. [employer] will be sending all DME orders to a company that doesn't provide pumps (yes, that is true). Currently, there is nothing anyone with this [facility] plan can do to obtain a pump. I have spoken to this patient several times and made her aware, but there seems to be significant cognitive issues.   Kristie Cowman Sr. Media planner [employer]

## 2022-07-04 ENCOUNTER — Ambulatory Visit (INDEPENDENT_AMBULATORY_CARE_PROVIDER_SITE_OTHER): Payer: Medicare HMO | Admitting: Vascular Surgery

## 2022-07-05 DIAGNOSIS — R6889 Other general symptoms and signs: Secondary | ICD-10-CM | POA: Diagnosis not present

## 2022-07-05 DIAGNOSIS — H26493 Other secondary cataract, bilateral: Secondary | ICD-10-CM | POA: Diagnosis not present

## 2022-07-05 DIAGNOSIS — H04123 Dry eye syndrome of bilateral lacrimal glands: Secondary | ICD-10-CM | POA: Diagnosis not present

## 2022-07-05 DIAGNOSIS — E119 Type 2 diabetes mellitus without complications: Secondary | ICD-10-CM | POA: Diagnosis not present

## 2022-07-10 DIAGNOSIS — E669 Obesity, unspecified: Secondary | ICD-10-CM | POA: Diagnosis not present

## 2022-07-10 DIAGNOSIS — I890 Lymphedema, not elsewhere classified: Secondary | ICD-10-CM | POA: Diagnosis not present

## 2022-07-10 DIAGNOSIS — N183 Chronic kidney disease, stage 3 unspecified: Secondary | ICD-10-CM | POA: Diagnosis not present

## 2022-07-10 DIAGNOSIS — R0602 Shortness of breath: Secondary | ICD-10-CM | POA: Diagnosis not present

## 2022-07-10 DIAGNOSIS — K219 Gastro-esophageal reflux disease without esophagitis: Secondary | ICD-10-CM | POA: Diagnosis not present

## 2022-07-10 DIAGNOSIS — E119 Type 2 diabetes mellitus without complications: Secondary | ICD-10-CM | POA: Diagnosis not present

## 2022-07-10 DIAGNOSIS — I10 Essential (primary) hypertension: Secondary | ICD-10-CM | POA: Diagnosis not present

## 2022-07-11 ENCOUNTER — Encounter: Payer: Self-pay | Admitting: Emergency Medicine

## 2022-07-11 ENCOUNTER — Emergency Department
Admission: EM | Admit: 2022-07-11 | Discharge: 2022-07-12 | Disposition: A | Discharge status: Home / Self Care | Payer: Medicare HMO | Attending: Emergency Medicine | Admitting: Emergency Medicine

## 2022-07-11 ENCOUNTER — Other Ambulatory Visit: Payer: Self-pay

## 2022-07-11 DIAGNOSIS — R42 Dizziness and giddiness: Secondary | ICD-10-CM | POA: Insufficient documentation

## 2022-07-11 DIAGNOSIS — F32A Depression, unspecified: Secondary | ICD-10-CM | POA: Diagnosis not present

## 2022-07-11 DIAGNOSIS — I10 Essential (primary) hypertension: Secondary | ICD-10-CM | POA: Diagnosis not present

## 2022-07-11 LAB — CBC
HCT: 33.8 % — ABNORMAL LOW (ref 36.0–46.0)
Hemoglobin: 10.5 g/dL — ABNORMAL LOW (ref 12.0–15.0)
MCH: 27.6 pg (ref 26.0–34.0)
MCHC: 31.1 g/dL (ref 30.0–36.0)
MCV: 88.9 fL (ref 80.0–100.0)
Platelets: 260 10*3/uL (ref 150–400)
RBC: 3.8 MIL/uL — ABNORMAL LOW (ref 3.87–5.11)
RDW: 14.5 % (ref 11.5–15.5)
WBC: 7.1 10*3/uL (ref 4.0–10.5)
nRBC: 0 % (ref 0.0–0.2)

## 2022-07-11 LAB — BASIC METABOLIC PANEL
Anion gap: 5 (ref 5–15)
BUN: 24 mg/dL — ABNORMAL HIGH (ref 8–23)
CO2: 26 mmol/L (ref 22–32)
Calcium: 10.6 mg/dL — ABNORMAL HIGH (ref 8.9–10.3)
Chloride: 107 mmol/L (ref 98–111)
Creatinine, Ser: 1.35 mg/dL — ABNORMAL HIGH (ref 0.44–1.00)
GFR, Estimated: 38 mL/min — ABNORMAL LOW (ref >60)
Glucose, Bld: 116 mg/dL — ABNORMAL HIGH (ref 70–99)
Potassium: 4.3 mmol/L (ref 3.5–5.1)
Sodium: 138 mmol/L (ref 135–145)

## 2022-07-11 LAB — CBG MONITORING, ED: Glucose-Capillary: 117 mg/dL — ABNORMAL HIGH (ref 70–99)

## 2022-07-11 LAB — URINALYSIS, ROUTINE W REFLEX MICROSCOPIC
Bilirubin Urine: NEGATIVE
Glucose, UA: NEGATIVE mg/dL
Hgb urine dipstick: NEGATIVE
Ketones, ur: NEGATIVE mg/dL
Leukocytes,Ua: NEGATIVE
Nitrite: NEGATIVE
Protein, ur: NEGATIVE mg/dL
Specific Gravity, Urine: 1.019 (ref 1.005–1.030)
pH: 5 (ref 5.0–8.0)

## 2022-07-11 NOTE — ED Triage Notes (Signed)
Patient to ED via [facility] from [facility] for dizziness. States she has had intermittent dizziness since having a concussion 2 years ago. States she had been walking more than normal when this started today.

## 2022-07-11 NOTE — ED Provider Notes (Signed)
[facility] Provider Note    Event Date/Time   First MD Initiated Contact with Patient 07/11/22 2326     (approximate)   History   Dizziness   HPI Sherry [name] is a 88 y.o. female who presents by EMS for evaluation of dizziness and her knee giving out on her.  She said that she has been having episodes of dizziness for about 2 years since a head injury.  She is used to it and today felt similar but worse.  She said that she thinks it is because she did a lot more walking today than she has done recently, since her injury in fact.  She uses a wheeled walker with a seat so that she can rest and she says she walked all around and spent a lot of time at the grocery store today.  It was then that she felt like her left knee buckled, which is common for her, but then she felt dizzy like she could not stand up.  The store employees called EMS for her "and here I am".  She has not had any chest pain.  When she ambulates she gets short of breath but that is not new.  She has not had any recent fever.  She has had slightly increased urinary frequency and has had UTIs in the past but has no dysuria.  She has no chest pain or abdominal pain.  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  In fact, this has been a long-term issue and she has an appointment with orthopedics tomorrow when they are supposed to give her some kind of braces for her knees.     Physical Exam   Triage Vital Signs: ED Triage Vitals [07/11/22 1802]  Enc Vitals Group     BP 126/77     Pulse Rate 66     Resp 18     Temp 97.8 F (36.6 C)     Temp Source Oral     SpO2 100 %     Weight      Height      Head Circumference      Peak Flow      Pain Score 10     Pain Loc      Pain Edu?      Excl. in GC?     Most recent vital signs: Vitals:   07/11/22 1802 07/11/22 2353  BP:  126/77 (!) 155/60  Pulse: 66 60  Resp: 18 14  Temp: 97.8 F (36.6 C) 98 F (36.7 C)  SpO2: 100% 100%    General: Awake, no distress.  Appears younger than chronological age.   CV:  Good peripheral perfusion.  Normal heart sounds, regular rate and rhythm. Resp:  Normal effort. Speaking easily and comfortably, no accessory muscle usage nor intercostal retractions.  Lungs are clear to auscultation. Abd:  No distention.  Other:  Patient is able to ambulate with assistance and confirmed that she uses a walker at baseline.  I helped her ambulate from the toilet back to her bed and she was able to do so with assistance similar to that of a walker.  She has no focal neurological deficits including no dysmetria with finger-to-nose testing, no obvious balance disturbances, no nystagmus, normal extraocular movements, and normal bilateral grip strength and upper and lower extremity  strength.  She has no dysarthria nor aphasia and is alert and oriented, very conversant and communicative, making jokes with me and referencing old TV shows and songs during our conversation.   ED Results / Procedures / Treatments   Labs (all labs ordered are listed, but only abnormal results are displayed) Labs Reviewed  BASIC METABOLIC PANEL - Abnormal; Notable for the following components:      Result Value   Glucose, Bld 116 (*)    BUN 24 (*)    Creatinine, Ser 1.35 (*)    Calcium 10.6 (*)    GFR, Estimated 38 (*)    All other components within normal limits  CBC - Abnormal; Notable for the following components:   RBC 3.80 (*)    Hemoglobin 10.5 (*)    HCT 33.8 (*)    All other components within normal limits  URINALYSIS, ROUTINE W REFLEX MICROSCOPIC - Abnormal; Notable for the following components:   Color, Urine YELLOW (*)    APPearance CLEAR (*)    All other components within normal limits  CBG MONITORING, ED - Abnormal; Notable for the following components:   Glucose-Capillary 117 (*)    All other  components within normal limits     EKG  ED ECG REPORT I, Loleta Rose, the attending physician, personally viewed and interpreted this ECG.  Date: 07/11/2022 EKG Time: 18: 04 Rate: 62 Rhythm: normal sinus rhythm QRS Axis: normal Intervals: Bifascicular block ST/T Wave abnormalities: Non-specific ST segment / T-wave changes, but no clear evidence of acute ischemia. Narrative Interpretation: no definitive evidence of acute ischemia; does not meet STEMI criteria.     PROCEDURES:  Critical Care performed: No  Procedures    IMPRESSION / MDM / ASSESSMENT AND PLAN / ED COURSE  I reviewed the triage vital signs and the nursing notes.                              Differential diagnosis includes, but is not limited to, acute on chronic weakness and gait instability, electrolyte or metabolic abnormality, UTI, CVA, TIA, medication or drug side effect.  Patient's presentation is most consistent with acute presentation with potential threat to life or bodily function.  Labs/studies ordered: CBG, urinalysis, BMP, CBC, EKG  Interventions/Medications given:  Medications - No data to display   Vital signs are stable and within normal limits during an extended period of observation in the ED (approximately 6 hours).  As documented above, the patient seems to be at her baseline and is quite alert and oriented.  She said that she lives alone but has family nearby.  No focal neurological deficits.  Over the course of our conversation, it became clear that the symptoms have been going on with her for a couple of years, including both her knee problems for which she has an orthopedics appointment tomorrow, and her episodes of dizziness that seem to be related to exertion.  She thinks that she overdid it yesterday/today and that led to a more profound episode at the store.  However she is feeling well and is asymptomatic at this time.  I considered imaging of her head such as CT head or even MRI  head, but given that she has had no focal neurological deficits and she has been having the symptoms for 2 years, I think it is a very low yield and utility.  All of the patient's labs are essentially normal or at her baseline.  No leukocytosis, no acute anemia, chronic kidney disease but no acute kidney injury.  Negative urinalysis, normal glucose.  The patient said she is reassured by her evaluation and is comfortable with the plan for discharge home so she can follow-up with orthopedics tomorrow.  At preparing her paperwork for follow-up with PCP and I gave my usual and customary return precautions.       FINAL CLINICAL IMPRESSION(S) / ED DIAGNOSES   Final diagnoses:  Dizziness     Rx / DC Orders   ED Discharge Orders     None        Note:  This document was prepared using Dragon voice recognition software and may include unintentional dictation errors.   Loleta Rose, MD 07/12/22 [id number]

## 2022-07-12 DIAGNOSIS — M25561 Pain in right knee: Secondary | ICD-10-CM | POA: Diagnosis not present

## 2022-07-12 DIAGNOSIS — R600 Localized edema: Secondary | ICD-10-CM | POA: Diagnosis not present

## 2022-07-12 DIAGNOSIS — M174 Other bilateral secondary osteoarthritis of knee: Secondary | ICD-10-CM | POA: Diagnosis not present

## 2022-07-12 DIAGNOSIS — R6889 Other general symptoms and signs: Secondary | ICD-10-CM | POA: Diagnosis not present

## 2022-07-12 DIAGNOSIS — M25562 Pain in left knee: Secondary | ICD-10-CM | POA: Diagnosis not present

## 2022-07-12 NOTE — Discharge Instructions (Addendum)
Your workup in the Emergency Department today was reassuring.  We did not find any specific abnormalities.  We recommend you drink plenty of fluids, take your regular medications and/or any new ones prescribed today, and follow up with the doctor(s) listed in these documents as recommended.  Return to the Emergency Department if you develop new or worsening symptoms that concern you.  

## 2022-07-12 NOTE — ED Provider Notes (Incomplete)
[facility] Provider Note    Event Date/Time   First MD Initiated Contact with Patient 07/11/22 2326     (approximate)   History   Dizziness   HPI Sherry [name] Sherry [name] is a 88 y.o. female who presents by EMS for evaluation of dizziness and her knee giving out on her.  She said that she has been having episodes of dizziness for about 2 years since a head injury.  She is used to it and today felt similar but worse.  She said that she thinks it is because she did a lot more walking today than she has done recently, since her injury in fact.  She uses a wheeled walker with a seat so that she can rest and she says she walked all around and spent a lot of time at the grocery store today.  It was then that she felt like her left knee buckled, which is common for her, but then she felt dizzy like she could not stand up.  The store employees called EMS for her "and here I am".  She has not had any chest pain.  When she ambulates she gets short of breath but that is not new.  She has not had any recent fever.  She has had slightly increased urinary frequency and has had UTIs in the past but has no dysuria.  She has no chest pain or abdominal pain.  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  In fact, this has been a long-term issue and she has an appointment with orthopedics tomorrow when they are supposed to give her some kind of braces for her knees.     Physical Exam   Triage Vital Signs: ED Triage Vitals [07/11/22 1802]  Enc Vitals Group     BP 126/77     Pulse Rate 66     Resp 18     Temp 97.8 F (36.6 C)     Temp Source Oral     SpO2 100 %     Weight      Height      Head Circumference      Peak Flow      Pain Score 10     Pain Loc      Pain Edu?      Excl. in GC?     Most recent vital signs: Vitals:   07/11/22 1802 07/11/22 2353  BP:  126/77 (!) 155/60  Pulse: 66 60  Resp: 18 14  Temp: 97.8 F (36.6 C) 98 F (36.7 C)  SpO2: 100% 100%    General: Awake, no distress.  Appears younger than chronological age.   CV:  Good peripheral perfusion.  Normal heart sounds, regular rate and rhythm. Resp:  Normal effort. Speaking easily and comfortably, no accessory muscle usage nor intercostal retractions.  Lungs are clear to auscultation. Abd:  No distention.  Other:  Patient is able to ambulate with assistance and confirmed that she uses a walker at baseline.  I helped her ambulate from the toilet back to her bed and she was able to do so with assistance similar to that of a walker.  She has no focal neurological deficits including no dysmetria with finger-to-nose testing, no obvious balance disturbances, no nystagmus, normal extraocular movements, and normal bilateral grip strength and upper and lower extremity  strength.  She has no dysarthria nor aphasia and is alert and oriented, very conversant and communicative, making jokes with me and referencing old TV shows and songs during our conversation.   ED Results / Procedures / Treatments   Labs (all labs ordered are listed, but only abnormal results are displayed) Labs Reviewed  BASIC METABOLIC PANEL - Abnormal; Notable for the following components:      Result Value   Glucose, Bld 116 (*)    BUN 24 (*)    Creatinine, Ser 1.35 (*)    Calcium 10.6 (*)    GFR, Estimated 38 (*)    All other components within normal limits  CBC - Abnormal; Notable for the following components:   RBC 3.80 (*)    Hemoglobin 10.5 (*)    HCT 33.8 (*)    All other components within normal limits  URINALYSIS, ROUTINE W REFLEX MICROSCOPIC - Abnormal; Notable for the following components:   Color, Urine YELLOW (*)    APPearance CLEAR (*)    All other components within normal limits  CBG MONITORING, ED - Abnormal; Notable for the following components:   Glucose-Capillary 117 (*)    All other  components within normal limits     EKG  ED ECG REPORT I, Sherry [name], the attending physician, personally viewed and interpreted this ECG.  Date: 07/11/2022 EKG Time: 18: 04 Rate: 62 Rhythm: normal sinus rhythm QRS Axis: normal Intervals: Bifascicular block ST/T Wave abnormalities: Non-specific ST segment / T-wave changes, but no clear evidence of acute ischemia. Narrative Interpretation: no definitive evidence of acute ischemia; does not meet STEMI criteria.     PROCEDURES:  Critical Care performed: No  Procedures    IMPRESSION / MDM / ASSESSMENT AND PLAN / ED COURSE  I reviewed the triage vital signs and the nursing notes.                              Differential diagnosis includes, but is not limited to, acute on chronic weakness and gait instability, electrolyte or metabolic abnormality, UTI, CVA, TIA, medication or drug side effect.  Patient's presentation is most consistent with acute presentation with potential threat to life or bodily function.  Labs/studies ordered: CBG, urinalysis, BMP, CBC, EKG  Interventions/Medications given:  Medications - No data to display   Vital signs are stable and within normal limits during an extended period of observation in the ED (approximately 6 hours).  As documented above, the patient seems to be at her baseline and is quite alert and oriented.  She said that she lives alone but has family nearby.  No focal neurological deficits.  Over the course of our conversation, it became clear that the symptoms have been going on with her for a couple of years, including both her knee problems for which she has an orthopedics appointment tomorrow, and her episodes of dizziness that seem to be related to exertion.  She thinks that she overdid it yesterday/today and that led to a more profound episode at the store.  However she is feeling well and is asymptomatic at this time.  I considered imaging of her head such as CT head or even MRI  head, but given that she has had no focal neurological deficits and she has been having the symptoms for 2 years, I think it is a very low yield and utility.  All of the patient's labs are essentially normal or at her baseline.  No leukocytosis, no acute anemia, chronic kidney disease but no acute kidney injury.  Negative urinalysis, normal glucose.  The patient said she is reassured by her evaluation and is comfortable with the plan for discharge home so she can follow-up with orthopedics tomorrow.  At preparing her paperwork for follow-up with PCP and I gave my usual and customary return precautions.       FINAL CLINICAL IMPRESSION(S) / ED DIAGNOSES   Final diagnoses:  None     Rx / DC Orders   ED Discharge Orders     None        Note:  This document was prepared using Dragon voice recognition software and may include unintentional dictation errors.

## 2022-07-19 ENCOUNTER — Telehealth: Payer: Self-pay

## 2022-07-19 NOTE — Telephone Encounter (Signed)
Transition Care Management Unsuccessful Follow-up Telephone Call  Date of discharge and from where:  Sherry [name] 4/24  Attempts:  1st Attempt  Reason for unsuccessful TCM follow-up call:  Unable to leave message   Lenard Forth [facility] Guide, [facility] [phone] [address] [place], [place], [place] [address] Phone: [phone] Email: [email or web address]

## 2022-07-20 ENCOUNTER — Telehealth: Payer: Self-pay

## 2022-07-20 NOTE — Telephone Encounter (Signed)
Transition Care Management Follow-up Telephone Call Date of discharge and from where: 4/25   How have you been since you were released from the hospital? Doing ok Any questions or concerns? Yes Patient has concerns about a boot that she was supposed to be getting from PCP and has not received  Items Reviewed: Did the pt receive and understand the discharge instructions provided? Yes  Medications obtained and verified? No  Other? No  Any new allergies since your discharge? No  Dietary orders reviewed? No Do you have support at home? No    Follow up appointments reviewed:  PCP Hospital f/u appt confirmed? Yes  Scheduled to see PCP on 4/25 @ . [facility]ist [facility] f/u appt confirmed?     Scheduled to see  on  @ . Are transportation arrangements needed? No  If their condition worsens, is the pt aware to call PCP or go to the Emergency Dept.? Yes Was the patient provided with contact information for the PCP's office or ED? Yes Was to pt encouraged to call back with questions or concerns? Yes

## 2022-08-22 DIAGNOSIS — N1832 Chronic kidney disease, stage 3b: Secondary | ICD-10-CM | POA: Diagnosis not present

## 2022-08-22 DIAGNOSIS — R6889 Other general symptoms and signs: Secondary | ICD-10-CM | POA: Diagnosis not present

## 2022-09-01 DIAGNOSIS — I129 Hypertensive chronic kidney disease with stage 1 through stage 4 chronic kidney disease, or unspecified chronic kidney disease: Secondary | ICD-10-CM | POA: Diagnosis not present

## 2022-09-01 DIAGNOSIS — E1122 Type 2 diabetes mellitus with diabetic chronic kidney disease: Secondary | ICD-10-CM | POA: Diagnosis not present

## 2022-09-01 DIAGNOSIS — R6889 Other general symptoms and signs: Secondary | ICD-10-CM | POA: Diagnosis not present

## 2022-09-01 DIAGNOSIS — N1832 Chronic kidney disease, stage 3b: Secondary | ICD-10-CM | POA: Diagnosis not present

## 2022-09-01 DIAGNOSIS — E7800 Pure hypercholesterolemia, unspecified: Secondary | ICD-10-CM | POA: Diagnosis not present

## 2022-09-01 DIAGNOSIS — E039 Hypothyroidism, unspecified: Secondary | ICD-10-CM | POA: Diagnosis not present

## 2022-09-07 DIAGNOSIS — E1122 Type 2 diabetes mellitus with diabetic chronic kidney disease: Secondary | ICD-10-CM | POA: Diagnosis not present

## 2022-09-07 DIAGNOSIS — M25762 Osteophyte, left knee: Secondary | ICD-10-CM | POA: Diagnosis not present

## 2022-09-07 DIAGNOSIS — I10 Essential (primary) hypertension: Secondary | ICD-10-CM | POA: Diagnosis not present

## 2022-09-07 DIAGNOSIS — E1151 Type 2 diabetes mellitus with diabetic peripheral angiopathy without gangrene: Secondary | ICD-10-CM | POA: Diagnosis not present

## 2022-09-07 DIAGNOSIS — M1712 Unilateral primary osteoarthritis, left knee: Secondary | ICD-10-CM | POA: Diagnosis not present

## 2022-09-07 DIAGNOSIS — J449 Chronic obstructive pulmonary disease, unspecified: Secondary | ICD-10-CM | POA: Diagnosis not present

## 2022-09-07 DIAGNOSIS — D631 Anemia in chronic kidney disease: Secondary | ICD-10-CM | POA: Diagnosis not present

## 2022-09-07 DIAGNOSIS — N1832 Chronic kidney disease, stage 3b: Secondary | ICD-10-CM | POA: Diagnosis not present

## 2022-09-07 DIAGNOSIS — E7800 Pure hypercholesterolemia, unspecified: Secondary | ICD-10-CM | POA: Diagnosis not present

## 2022-09-14 DIAGNOSIS — I10 Essential (primary) hypertension: Secondary | ICD-10-CM | POA: Diagnosis not present

## 2022-09-14 DIAGNOSIS — M25762 Osteophyte, left knee: Secondary | ICD-10-CM | POA: Diagnosis not present

## 2022-09-14 DIAGNOSIS — D631 Anemia in chronic kidney disease: Secondary | ICD-10-CM | POA: Diagnosis not present

## 2022-09-14 DIAGNOSIS — E1151 Type 2 diabetes mellitus with diabetic peripheral angiopathy without gangrene: Secondary | ICD-10-CM | POA: Diagnosis not present

## 2022-09-14 DIAGNOSIS — E1122 Type 2 diabetes mellitus with diabetic chronic kidney disease: Secondary | ICD-10-CM | POA: Diagnosis not present

## 2022-09-14 DIAGNOSIS — E7800 Pure hypercholesterolemia, unspecified: Secondary | ICD-10-CM | POA: Diagnosis not present

## 2022-09-14 DIAGNOSIS — J449 Chronic obstructive pulmonary disease, unspecified: Secondary | ICD-10-CM | POA: Diagnosis not present

## 2022-09-14 DIAGNOSIS — M1712 Unilateral primary osteoarthritis, left knee: Secondary | ICD-10-CM | POA: Diagnosis not present

## 2022-09-14 DIAGNOSIS — N1832 Chronic kidney disease, stage 3b: Secondary | ICD-10-CM | POA: Diagnosis not present

## 2022-09-18 DIAGNOSIS — E1151 Type 2 diabetes mellitus with diabetic peripheral angiopathy without gangrene: Secondary | ICD-10-CM | POA: Diagnosis not present

## 2022-09-18 DIAGNOSIS — E7800 Pure hypercholesterolemia, unspecified: Secondary | ICD-10-CM | POA: Diagnosis not present

## 2022-09-18 DIAGNOSIS — N1832 Chronic kidney disease, stage 3b: Secondary | ICD-10-CM | POA: Diagnosis not present

## 2022-09-18 DIAGNOSIS — I10 Essential (primary) hypertension: Secondary | ICD-10-CM | POA: Diagnosis not present

## 2022-09-18 DIAGNOSIS — M25762 Osteophyte, left knee: Secondary | ICD-10-CM | POA: Diagnosis not present

## 2022-09-18 DIAGNOSIS — M1712 Unilateral primary osteoarthritis, left knee: Secondary | ICD-10-CM | POA: Diagnosis not present

## 2022-09-18 DIAGNOSIS — E1122 Type 2 diabetes mellitus with diabetic chronic kidney disease: Secondary | ICD-10-CM | POA: Diagnosis not present

## 2022-09-18 DIAGNOSIS — D631 Anemia in chronic kidney disease: Secondary | ICD-10-CM | POA: Diagnosis not present

## 2022-09-18 DIAGNOSIS — J449 Chronic obstructive pulmonary disease, unspecified: Secondary | ICD-10-CM | POA: Diagnosis not present

## 2022-09-19 ENCOUNTER — Encounter (INDEPENDENT_AMBULATORY_CARE_PROVIDER_SITE_OTHER): Payer: Medicare HMO | Admitting: Nurse Practitioner

## 2022-09-19 DIAGNOSIS — M1712 Unilateral primary osteoarthritis, left knee: Secondary | ICD-10-CM | POA: Diagnosis not present

## 2022-09-19 DIAGNOSIS — D631 Anemia in chronic kidney disease: Secondary | ICD-10-CM | POA: Diagnosis not present

## 2022-09-19 DIAGNOSIS — J449 Chronic obstructive pulmonary disease, unspecified: Secondary | ICD-10-CM | POA: Diagnosis not present

## 2022-09-19 DIAGNOSIS — R6889 Other general symptoms and signs: Secondary | ICD-10-CM | POA: Diagnosis not present

## 2022-09-19 DIAGNOSIS — M25762 Osteophyte, left knee: Secondary | ICD-10-CM | POA: Diagnosis not present

## 2022-09-19 DIAGNOSIS — N1832 Chronic kidney disease, stage 3b: Secondary | ICD-10-CM | POA: Diagnosis not present

## 2022-09-19 DIAGNOSIS — I10 Essential (primary) hypertension: Secondary | ICD-10-CM | POA: Diagnosis not present

## 2022-09-19 DIAGNOSIS — E1122 Type 2 diabetes mellitus with diabetic chronic kidney disease: Secondary | ICD-10-CM | POA: Diagnosis not present

## 2022-09-27 DIAGNOSIS — D631 Anemia in chronic kidney disease: Secondary | ICD-10-CM | POA: Diagnosis not present

## 2022-09-27 DIAGNOSIS — E7800 Pure hypercholesterolemia, unspecified: Secondary | ICD-10-CM | POA: Diagnosis not present

## 2022-09-27 DIAGNOSIS — E1151 Type 2 diabetes mellitus with diabetic peripheral angiopathy without gangrene: Secondary | ICD-10-CM | POA: Diagnosis not present

## 2022-09-27 DIAGNOSIS — M1712 Unilateral primary osteoarthritis, left knee: Secondary | ICD-10-CM | POA: Diagnosis not present

## 2022-09-27 DIAGNOSIS — J449 Chronic obstructive pulmonary disease, unspecified: Secondary | ICD-10-CM | POA: Diagnosis not present

## 2022-09-27 DIAGNOSIS — I10 Essential (primary) hypertension: Secondary | ICD-10-CM | POA: Diagnosis not present

## 2022-09-27 DIAGNOSIS — N1832 Chronic kidney disease, stage 3b: Secondary | ICD-10-CM | POA: Diagnosis not present

## 2022-09-27 DIAGNOSIS — M25762 Osteophyte, left knee: Secondary | ICD-10-CM | POA: Diagnosis not present

## 2022-09-27 DIAGNOSIS — E1122 Type 2 diabetes mellitus with diabetic chronic kidney disease: Secondary | ICD-10-CM | POA: Diagnosis not present

## 2022-09-28 ENCOUNTER — Ambulatory Visit: Payer: Medicare HMO | Admitting: Occupational Therapy

## 2022-09-29 DIAGNOSIS — I10 Essential (primary) hypertension: Secondary | ICD-10-CM | POA: Diagnosis not present

## 2022-09-29 DIAGNOSIS — M25762 Osteophyte, left knee: Secondary | ICD-10-CM | POA: Diagnosis not present

## 2022-09-29 DIAGNOSIS — D631 Anemia in chronic kidney disease: Secondary | ICD-10-CM | POA: Diagnosis not present

## 2022-09-29 DIAGNOSIS — E7800 Pure hypercholesterolemia, unspecified: Secondary | ICD-10-CM | POA: Diagnosis not present

## 2022-09-29 DIAGNOSIS — J449 Chronic obstructive pulmonary disease, unspecified: Secondary | ICD-10-CM | POA: Diagnosis not present

## 2022-09-29 DIAGNOSIS — E1151 Type 2 diabetes mellitus with diabetic peripheral angiopathy without gangrene: Secondary | ICD-10-CM | POA: Diagnosis not present

## 2022-09-29 DIAGNOSIS — N1832 Chronic kidney disease, stage 3b: Secondary | ICD-10-CM | POA: Diagnosis not present

## 2022-09-29 DIAGNOSIS — M1712 Unilateral primary osteoarthritis, left knee: Secondary | ICD-10-CM | POA: Diagnosis not present

## 2022-09-29 DIAGNOSIS — E1122 Type 2 diabetes mellitus with diabetic chronic kidney disease: Secondary | ICD-10-CM | POA: Diagnosis not present

## 2022-10-03 ENCOUNTER — Encounter (INDEPENDENT_AMBULATORY_CARE_PROVIDER_SITE_OTHER): Payer: Medicare HMO | Admitting: Vascular Surgery

## 2022-10-03 DIAGNOSIS — R6889 Other general symptoms and signs: Secondary | ICD-10-CM | POA: Diagnosis not present

## 2022-10-04 ENCOUNTER — Encounter: Payer: Medicare HMO | Admitting: Occupational Therapy

## 2022-10-05 ENCOUNTER — Telehealth (INDEPENDENT_AMBULATORY_CARE_PROVIDER_SITE_OTHER): Payer: Self-pay | Admitting: Nurse Practitioner

## 2022-10-05 NOTE — Telephone Encounter (Signed)
After 4 weeks of daily exercise, elevation, and compression patient lymphedema and hyperpigmentation has not improved. Recommendation of pump.   Measurements: Left ankle  29.6cm (6/18);  30.0cm(7/16) Right ankle  30.9cm(6/18)  31.2cm(7/16) Left calf      37.8cm(6/18);  37.9cm(7/16) Right calf         37.9cm(6/18);  38.1cm(7/16)

## 2022-10-06 ENCOUNTER — Ambulatory Visit: Payer: Medicare HMO | Admitting: Occupational Therapy

## 2022-10-11 ENCOUNTER — Encounter: Payer: Medicare HMO | Admitting: Occupational Therapy

## 2022-10-13 ENCOUNTER — Ambulatory Visit: Payer: Medicare HMO | Admitting: Occupational Therapy

## 2022-10-18 ENCOUNTER — Ambulatory Visit: Payer: Medicare HMO | Admitting: Occupational Therapy

## 2022-10-20 ENCOUNTER — Ambulatory Visit: Payer: Medicare HMO | Admitting: Occupational Therapy

## 2022-10-20 DIAGNOSIS — D631 Anemia in chronic kidney disease: Secondary | ICD-10-CM | POA: Diagnosis not present

## 2022-10-20 DIAGNOSIS — I10 Essential (primary) hypertension: Secondary | ICD-10-CM | POA: Diagnosis not present

## 2022-10-20 DIAGNOSIS — E1122 Type 2 diabetes mellitus with diabetic chronic kidney disease: Secondary | ICD-10-CM | POA: Diagnosis not present

## 2022-10-20 DIAGNOSIS — N1832 Chronic kidney disease, stage 3b: Secondary | ICD-10-CM | POA: Diagnosis not present

## 2022-10-20 DIAGNOSIS — E7800 Pure hypercholesterolemia, unspecified: Secondary | ICD-10-CM | POA: Diagnosis not present

## 2022-10-20 DIAGNOSIS — J449 Chronic obstructive pulmonary disease, unspecified: Secondary | ICD-10-CM | POA: Diagnosis not present

## 2022-10-20 DIAGNOSIS — E1151 Type 2 diabetes mellitus with diabetic peripheral angiopathy without gangrene: Secondary | ICD-10-CM | POA: Diagnosis not present

## 2022-10-20 DIAGNOSIS — M25762 Osteophyte, left knee: Secondary | ICD-10-CM | POA: Diagnosis not present

## 2022-10-20 DIAGNOSIS — M1712 Unilateral primary osteoarthritis, left knee: Secondary | ICD-10-CM | POA: Diagnosis not present

## 2022-10-23 ENCOUNTER — Ambulatory Visit: Payer: Medicare HMO | Admitting: Occupational Therapy

## 2022-10-26 ENCOUNTER — Encounter: Payer: Medicare HMO | Admitting: Occupational Therapy

## 2022-10-30 DIAGNOSIS — J069 Acute upper respiratory infection, unspecified: Secondary | ICD-10-CM | POA: Diagnosis not present

## 2022-10-31 ENCOUNTER — Encounter (INDEPENDENT_AMBULATORY_CARE_PROVIDER_SITE_OTHER): Payer: Medicare HMO | Admitting: Vascular Surgery

## 2022-11-01 ENCOUNTER — Encounter: Payer: Medicare HMO | Admitting: Occupational Therapy

## 2022-11-03 ENCOUNTER — Encounter: Payer: Medicare HMO | Admitting: Occupational Therapy

## 2022-11-06 ENCOUNTER — Encounter: Payer: Medicare HMO | Admitting: Occupational Therapy

## 2022-11-09 ENCOUNTER — Encounter: Payer: Medicare HMO | Admitting: Occupational Therapy

## 2022-11-13 ENCOUNTER — Encounter: Payer: Medicare HMO | Admitting: Occupational Therapy

## 2022-11-14 ENCOUNTER — Encounter (INDEPENDENT_AMBULATORY_CARE_PROVIDER_SITE_OTHER): Payer: Medicare HMO | Admitting: Vascular Surgery

## 2022-11-14 DIAGNOSIS — R6889 Other general symptoms and signs: Secondary | ICD-10-CM | POA: Diagnosis not present

## 2022-11-15 ENCOUNTER — Telehealth (INDEPENDENT_AMBULATORY_CARE_PROVIDER_SITE_OTHER): Payer: Self-pay

## 2022-11-15 NOTE — Telephone Encounter (Signed)
Sherry [name] called stating she missed her appt because transportation didn't pick her up and she would like to reschedule.  Can someone please call her back to schedule her?

## 2022-11-16 ENCOUNTER — Encounter: Payer: Medicare HMO | Admitting: Occupational Therapy

## 2022-11-16 NOTE — Telephone Encounter (Signed)
Patient scheduled.

## 2022-11-21 ENCOUNTER — Encounter: Payer: Medicare HMO | Admitting: Occupational Therapy

## 2022-11-24 ENCOUNTER — Encounter: Payer: Medicare HMO | Admitting: Occupational Therapy

## 2022-11-27 ENCOUNTER — Encounter: Payer: Medicare HMO | Admitting: Occupational Therapy

## 2022-11-30 ENCOUNTER — Encounter: Payer: Medicare HMO | Admitting: Occupational Therapy

## 2022-12-04 ENCOUNTER — Encounter: Payer: Medicare HMO | Admitting: Occupational Therapy

## 2022-12-05 ENCOUNTER — Encounter (INDEPENDENT_AMBULATORY_CARE_PROVIDER_SITE_OTHER): Payer: Medicare HMO | Admitting: Vascular Surgery

## 2022-12-05 DIAGNOSIS — R6889 Other general symptoms and signs: Secondary | ICD-10-CM | POA: Diagnosis not present

## 2022-12-07 ENCOUNTER — Encounter: Payer: Medicare HMO | Admitting: Occupational Therapy

## 2022-12-12 ENCOUNTER — Encounter: Payer: Medicare HMO | Admitting: Occupational Therapy

## 2022-12-14 ENCOUNTER — Encounter: Payer: Medicare HMO | Admitting: Occupational Therapy

## 2022-12-19 ENCOUNTER — Encounter: Payer: Medicare HMO | Admitting: Occupational Therapy

## 2022-12-21 ENCOUNTER — Encounter: Payer: Medicare HMO | Admitting: Occupational Therapy

## 2022-12-21 DIAGNOSIS — E039 Hypothyroidism, unspecified: Secondary | ICD-10-CM | POA: Diagnosis not present

## 2022-12-21 DIAGNOSIS — Z87891 Personal history of nicotine dependence: Secondary | ICD-10-CM | POA: Diagnosis not present

## 2022-12-21 DIAGNOSIS — E7800 Pure hypercholesterolemia, unspecified: Secondary | ICD-10-CM | POA: Diagnosis not present

## 2022-12-21 DIAGNOSIS — Z0001 Encounter for general adult medical examination with abnormal findings: Secondary | ICD-10-CM | POA: Diagnosis not present

## 2022-12-21 DIAGNOSIS — E1122 Type 2 diabetes mellitus with diabetic chronic kidney disease: Secondary | ICD-10-CM | POA: Diagnosis not present

## 2022-12-21 DIAGNOSIS — N1832 Chronic kidney disease, stage 3b: Secondary | ICD-10-CM | POA: Diagnosis not present

## 2022-12-21 DIAGNOSIS — R6889 Other general symptoms and signs: Secondary | ICD-10-CM | POA: Diagnosis not present

## 2022-12-21 DIAGNOSIS — I129 Hypertensive chronic kidney disease with stage 1 through stage 4 chronic kidney disease, or unspecified chronic kidney disease: Secondary | ICD-10-CM | POA: Diagnosis not present

## 2023-01-12 DIAGNOSIS — E119 Type 2 diabetes mellitus without complications: Secondary | ICD-10-CM | POA: Diagnosis not present

## 2023-01-12 DIAGNOSIS — I890 Lymphedema, not elsewhere classified: Secondary | ICD-10-CM | POA: Diagnosis not present

## 2023-01-12 DIAGNOSIS — R600 Localized edema: Secondary | ICD-10-CM | POA: Diagnosis not present

## 2023-01-12 DIAGNOSIS — E669 Obesity, unspecified: Secondary | ICD-10-CM | POA: Diagnosis not present

## 2023-01-12 DIAGNOSIS — K219 Gastro-esophageal reflux disease without esophagitis: Secondary | ICD-10-CM | POA: Diagnosis not present

## 2023-01-12 DIAGNOSIS — N183 Chronic kidney disease, stage 3 unspecified: Secondary | ICD-10-CM | POA: Diagnosis not present

## 2023-01-12 DIAGNOSIS — I2510 Atherosclerotic heart disease of native coronary artery without angina pectoris: Secondary | ICD-10-CM | POA: Diagnosis not present

## 2023-01-12 DIAGNOSIS — R0602 Shortness of breath: Secondary | ICD-10-CM | POA: Diagnosis not present

## 2023-01-12 DIAGNOSIS — I10 Essential (primary) hypertension: Secondary | ICD-10-CM | POA: Diagnosis not present

## 2023-01-25 DIAGNOSIS — I129 Hypertensive chronic kidney disease with stage 1 through stage 4 chronic kidney disease, or unspecified chronic kidney disease: Secondary | ICD-10-CM | POA: Diagnosis not present

## 2023-01-25 DIAGNOSIS — E039 Hypothyroidism, unspecified: Secondary | ICD-10-CM | POA: Diagnosis not present

## 2023-01-25 DIAGNOSIS — Z87891 Personal history of nicotine dependence: Secondary | ICD-10-CM | POA: Diagnosis not present

## 2023-01-25 DIAGNOSIS — G9332 Myalgic encephalomyelitis/chronic fatigue syndrome: Secondary | ICD-10-CM | POA: Diagnosis not present

## 2023-01-25 DIAGNOSIS — J449 Chronic obstructive pulmonary disease, unspecified: Secondary | ICD-10-CM | POA: Diagnosis not present

## 2023-01-25 DIAGNOSIS — N1832 Chronic kidney disease, stage 3b: Secondary | ICD-10-CM | POA: Diagnosis not present

## 2023-01-25 DIAGNOSIS — E1122 Type 2 diabetes mellitus with diabetic chronic kidney disease: Secondary | ICD-10-CM | POA: Diagnosis not present

## 2023-02-19 DIAGNOSIS — N1832 Chronic kidney disease, stage 3b: Secondary | ICD-10-CM | POA: Diagnosis not present

## 2023-02-19 DIAGNOSIS — Z7689 Persons encountering health services in other specified circumstances: Secondary | ICD-10-CM | POA: Diagnosis not present

## 2023-02-19 DIAGNOSIS — D472 Monoclonal gammopathy: Secondary | ICD-10-CM | POA: Diagnosis not present

## 2023-05-01 ENCOUNTER — Emergency Department: Payer: Medicare HMO

## 2023-05-01 ENCOUNTER — Emergency Department
Admission: EM | Admit: 2023-05-01 | Discharge: 2023-05-01 | Disposition: A | Discharge status: Home / Self Care | Payer: Medicare HMO | Attending: Emergency Medicine | Admitting: Emergency Medicine

## 2023-05-01 ENCOUNTER — Other Ambulatory Visit: Payer: Self-pay

## 2023-05-01 DIAGNOSIS — Y92000 Kitchen of unspecified non-institutional (private) residence as  the place of occurrence of the external cause: Secondary | ICD-10-CM | POA: Insufficient documentation

## 2023-05-01 DIAGNOSIS — E1122 Type 2 diabetes mellitus with diabetic chronic kidney disease: Secondary | ICD-10-CM | POA: Insufficient documentation

## 2023-05-01 DIAGNOSIS — M25519 Pain in unspecified shoulder: Secondary | ICD-10-CM | POA: Diagnosis not present

## 2023-05-01 DIAGNOSIS — N3001 Acute cystitis with hematuria: Secondary | ICD-10-CM | POA: Diagnosis not present

## 2023-05-01 DIAGNOSIS — W19XXXA Unspecified fall, initial encounter: Secondary | ICD-10-CM | POA: Diagnosis not present

## 2023-05-01 DIAGNOSIS — N1832 Chronic kidney disease, stage 3b: Secondary | ICD-10-CM | POA: Insufficient documentation

## 2023-05-01 DIAGNOSIS — S0990XA Unspecified injury of head, initial encounter: Secondary | ICD-10-CM | POA: Diagnosis not present

## 2023-05-01 DIAGNOSIS — S199XXA Unspecified injury of neck, initial encounter: Secondary | ICD-10-CM | POA: Diagnosis not present

## 2023-05-01 DIAGNOSIS — I10 Essential (primary) hypertension: Secondary | ICD-10-CM | POA: Diagnosis not present

## 2023-05-01 DIAGNOSIS — Z79899 Other long term (current) drug therapy: Secondary | ICD-10-CM | POA: Insufficient documentation

## 2023-05-01 DIAGNOSIS — M436 Torticollis: Secondary | ICD-10-CM | POA: Diagnosis not present

## 2023-05-01 DIAGNOSIS — S0993XA Unspecified injury of face, initial encounter: Secondary | ICD-10-CM | POA: Diagnosis not present

## 2023-05-01 DIAGNOSIS — M25512 Pain in left shoulder: Secondary | ICD-10-CM | POA: Diagnosis not present

## 2023-05-01 DIAGNOSIS — M542 Cervicalgia: Secondary | ICD-10-CM | POA: Insufficient documentation

## 2023-05-01 DIAGNOSIS — M4313 Spondylolisthesis, cervicothoracic region: Secondary | ICD-10-CM | POA: Diagnosis not present

## 2023-05-01 DIAGNOSIS — E039 Hypothyroidism, unspecified: Secondary | ICD-10-CM | POA: Diagnosis not present

## 2023-05-01 DIAGNOSIS — M47812 Spondylosis without myelopathy or radiculopathy, cervical region: Secondary | ICD-10-CM

## 2023-05-01 DIAGNOSIS — I129 Hypertensive chronic kidney disease with stage 1 through stage 4 chronic kidney disease, or unspecified chronic kidney disease: Secondary | ICD-10-CM | POA: Insufficient documentation

## 2023-05-01 DIAGNOSIS — M5030 Other cervical disc degeneration, unspecified cervical region: Secondary | ICD-10-CM | POA: Diagnosis not present

## 2023-05-01 DIAGNOSIS — R609 Edema, unspecified: Secondary | ICD-10-CM | POA: Diagnosis not present

## 2023-05-01 DIAGNOSIS — M19012 Primary osteoarthritis, left shoulder: Secondary | ICD-10-CM | POA: Diagnosis not present

## 2023-05-01 LAB — CBC WITH DIFFERENTIAL/PLATELET
Abs Immature Granulocytes: 0.04 10*3/uL (ref 0.00–0.07)
Basophils Absolute: 0.1 10*3/uL (ref 0.0–0.1)
Basophils Relative: 1 %
Eosinophils Absolute: 0.3 10*3/uL (ref 0.0–0.5)
Eosinophils Relative: 3 %
HCT: 37.8 % (ref 36.0–46.0)
Hemoglobin: 11.8 g/dL — ABNORMAL LOW (ref 12.0–15.0)
Immature Granulocytes: 1 %
Lymphocytes Relative: 17 %
Lymphs Abs: 1.5 10*3/uL (ref 0.7–4.0)
MCH: 27.3 pg (ref 26.0–34.0)
MCHC: 31.2 g/dL (ref 30.0–36.0)
MCV: 87.5 fL (ref 80.0–100.0)
Monocytes Absolute: 0.6 10*3/uL (ref 0.1–1.0)
Monocytes Relative: 7 %
Neutro Abs: 6.4 10*3/uL (ref 1.7–7.7)
Neutrophils Relative %: 71 %
Platelets: 277 10*3/uL (ref 150–400)
RBC: 4.32 MIL/uL (ref 3.87–5.11)
RDW: 13.9 % (ref 11.5–15.5)
WBC: 8.8 10*3/uL (ref 4.0–10.5)
nRBC: 0 % (ref 0.0–0.2)

## 2023-05-01 LAB — URINALYSIS, ROUTINE W REFLEX MICROSCOPIC
Bilirubin Urine: NEGATIVE
Glucose, UA: NEGATIVE mg/dL
Ketones, ur: NEGATIVE mg/dL
Nitrite: NEGATIVE
Protein, ur: 30 mg/dL — AB
Specific Gravity, Urine: 1.016 (ref 1.005–1.030)
WBC, UA: >50 WBC/hpf (ref 0–5)
pH: 6 (ref 5.0–8.0)

## 2023-05-01 LAB — BASIC METABOLIC PANEL
Anion gap: 10 (ref 5–15)
BUN: 24 mg/dL — ABNORMAL HIGH (ref 8–23)
CO2: 23 mmol/L (ref 22–32)
Calcium: 10.9 mg/dL — ABNORMAL HIGH (ref 8.9–10.3)
Chloride: 103 mmol/L (ref 98–111)
Creatinine, Ser: 1.25 mg/dL — ABNORMAL HIGH (ref 0.44–1.00)
GFR, Estimated: 42 mL/min — ABNORMAL LOW (ref >60)
Glucose, Bld: 139 mg/dL — ABNORMAL HIGH (ref 70–99)
Potassium: 4.6 mmol/L (ref 3.5–5.1)
Sodium: 136 mmol/L (ref 135–145)

## 2023-05-01 LAB — TROPONIN I (HIGH SENSITIVITY): Troponin I (High Sensitivity): 12 ng/L (ref <18)

## 2023-05-01 LAB — CK: Total CK: 159 U/L (ref 38–234)

## 2023-05-01 MED ORDER — CEFTRIAXONE SODIUM 1 G IJ SOLR
1.0000 g | Freq: Once | INTRAMUSCULAR | Status: AC
  Administered 2023-05-01: 1 g via INTRAMUSCULAR
  Filled 2023-05-01 (×2): qty 10

## 2023-05-01 MED ORDER — SODIUM CHLORIDE 0.9 % IV SOLN: 2.0000 g | Freq: Once | INTRAVENOUS | Status: DC

## 2023-05-01 MED ORDER — CEPHALEXIN 500 MG PO CAPS: 500.0000 mg | ORAL_CAPSULE | Freq: Two times a day (BID) | ORAL | 0 refills | Status: AC

## 2023-05-01 NOTE — Discharge Instructions (Addendum)
You were seen in the emergency department following a fall.  You are found to have a urinary tract infection.  You are given a dose of antibiotics in the emergency department.  Take your next dose of antibiotics tomorrow morning.  He will take Keflex twice a day for 5 days.  Your urine was sent for culture and if this antibiotics does not cover your urinary tract infection someone will call you to switch antibiotics.  You had an MRI of your neck that showed bad arthritis and nerve impingement at C2.  Given that you are have ongoing pain and follow-up closely and call your primary care physician tomorrow, they can give you a referral to pain specialist for possible injections for your arthritis.  Return to the emergency department if you have any numbness, weakness or worsening pain.  You can take 1000 mg of Tylenol every 6 hours for pain control.  Thank you for choosing [place] for your health care, it was my pleasure to care for you today!  Corena Herter, MD

## 2023-05-01 NOTE — ED Triage Notes (Signed)
Pt to ED via EMS from home, pt reports falling tonight around 2230 and has been on the ground since because she was unable to get up. Pt is unsure what made her fall. Denies blood thinner use. Unsure if she hit her head or not. Pt c/o neck stiffness and bilateral shoulder pain

## 2023-05-01 NOTE — ED Provider Notes (Signed)
[facility] Provider Note    Event Date/Time   First MD Initiated Contact with Patient 05/01/23 [phone]     (approximate)   History   Fall   HPI  Sherry [name] is a 89 y.o. female brought to the ED via EMS from home status post fall.  Patient was making chicken and dumplings and went to the kitchen to check on them.  Does not know why she fell.  Denies preceding symptoms of chest pain, shortness of breath, nausea/vomiting or dizziness.  Denies anticoagulant use.  Does not recall if she hit her head.  Complains of neck stiffness.  Patient originally unable to get up unassisted but able to ambulate with her walker once she was assisted up by EMS.     Past Medical History   Past Medical History:  Diagnosis Date  . Allergy   . Diabetes mellitus without complication (HCC)   . Renal insufficiency   . Thyroid disease      Active Problem List   Patient Active Problem List   Diagnosis Date Noted  . Chronic daily headache 04/25/2021  . Dizziness 04/25/2021  . Secondary hyperparathyroidism of renal origin (HCC) 11/10/2019  . Anemia in chronic kidney disease 10/03/2019  . Benign hypertensive kidney disease with chronic kidney disease 10/03/2019  . Stage 3b chronic kidney disease (HCC) 10/03/2019  . Type 2 diabetes mellitus with diabetic chronic kidney disease (HCC) 10/03/2019  . Gastroesophageal reflux disease without esophagitis 05/20/2018  . Weakness 05/20/2018  . Syncope 03/27/2017  . Hypothyroidism 03/27/2017  . Adrenal nodule (HCC) 10/13/2016  . Health care maintenance 02/29/2016  . Primary osteoarthritis of both knees 05/07/2015  . Cervical radiculitis 11/03/2014  . DDD (degenerative disc disease), cervical 11/03/2014  . Lumbar stenosis with neurogenic claudication 11/03/2014  . DDD (degenerative disc disease), lumbar 07/14/2014  . Lumbar radiculitis 07/14/2014  . Trochanteric bursitis of right hip 07/14/2014  . Pure hypercholesterolemia  05/25/2014  . Multinodular non-toxic goiter 12/25/2013  . HTN (hypertension), benign 11/26/2013  . Diabetes mellitus with stage 3 chronic kidney disease (HCC) 09/14/2013  . OA (osteoarthritis) 09/14/2013     Past Surgical History   Past Surgical History:  Procedure Laterality Date  . CHOLECYSTECTOMY       Home Medications   Prior to Admission medications   Medication Sig Start Date End Date Taking? Authorizing Provider  ACCU-CHEK AVIVA PLUS test strip  02/06/20   [provider]  albuterol (VENTOLIN HFA) 108 (90 Base) MCG/ACT inhaler Inhale 2 puffs into the lungs every 6 (six) hours as needed. 01/06/19   [provider]  atenolol (TENORMIN) 25 MG tablet Take 25 mg by mouth daily. 08/19/19   [provider]  Blood Glucose Calibration (ACCU-CHEK AVIVA) SOLN  10/17/17   [provider]  Blood Glucose Monitoring Suppl (ACCU-CHEK AVIVA PLUS) w/Device KIT See admin instructions. 01/06/19   [provider]  Bromfenac Sodium 0.07 % SOLN Place 1 drop into both eyes daily. Patient not taking: Reported on 02/27/2020    [provider]  cetirizine (ZYRTEC) 10 MG tablet Take 10 mg by mouth daily. Patient not taking: Reported on 06/14/2022 11/24/21   [provider]  COVID-19 mRNA bivalent vaccine, Pfizer, (PFIZER COVID-19 VAC BIVALENT) injection Inject into the muscle. Patient not taking: Reported on 01/02/2022 12/31/20   Judyann Munson, MD  diclofenac Sodium (VOLTAREN) 1 % GEL Apply 2 g topically as needed. 01/06/19   [provider]  estradiol (ESTRACE) 0.5 MG tablet  Take 0.5 mg by mouth daily.  04/07/16   [provider]  fluconazole (DIFLUCAN) 150 MG tablet Take 150 mg by mouth once. Patient not taking: Reported on 12/21/2020 10/07/20   [provider]  furosemide (LASIX) 40 MG tablet Take 20 mg by mouth daily as needed for fluid or edema (for ankle swelling). Patient not taking: Reported on 12/21/2020     [provider]  gabapentin (NEURONTIN) 400 MG capsule Take 400 mg by mouth daily. 11/11/19   [provider]  influenza vaccine adjuvanted (FLUAD QUADRIVALENT) 0.5 ML injection Inject into the muscle. Patient not taking: Reported on 01/02/2022 12/31/20   Judyann Munson, MD  levothyroxine (SYNTHROID, LEVOTHROID) 25 MCG tablet TAKE 25 mcg TABLET EVERY DAY ON AN EMPTY STOMACH WITH A GLASS OF WATER AT LEAST 30 TO 60 MINUTES BEFORE BREAKFAST Patient taking differently: Take 25 mcg by mouth daily before breakfast. 03/29/17   Hongalgi, Maximino Greenland, MD  losartan (COZAAR) 50 MG tablet Take 50 mg by mouth daily. 09/14/16   [provider]  metFORMIN (GLUCOPHAGE) 500 MG tablet TAKE 500 mg TABLET TWICE DAILY WITH MEALS  Do not take on 1/10 or 03/30/17 due to contrast given for CT scan. Restart taking on 03/31/17. Patient taking differently: Take 500 mg by mouth 2 (two) times daily with a meal. 03/29/17   Hongalgi, Maximino Greenland, MD  montelukast (SINGULAIR) 10 MG tablet Take 10 mg by mouth daily. Patient not taking: Reported on 06/08/2020 11/14/16   [provider]  omeprazole (PRILOSEC) 40 MG capsule Take 40 mg by mouth daily. Patient not taking: Reported on 06/14/2022 11/11/19   [provider]  progesterone (PROMETRIUM) 200 MG capsule Take 200 mg by mouth at bedtime.  09/14/16   [provider]  traMADol (ULTRAM) 50 MG tablet Take 50 mg by mouth every 12 (twelve) hours as needed for moderate pain.    [provider]     Allergies  Penicillins   Family History   Family History  Problem Relation Age of Onset  . Diabetes Mother   . Hypertension Mother   . Hypertension Father   . Cancer Brother      Physical Exam  Triage Vital Signs: ED Triage Vitals  Encounter Vitals Group     BP 05/01/23 0152 127/62     Systolic BP Percentile --      Diastolic BP Percentile --      Pulse Rate 05/01/23 0151 70     Resp 05/01/23 0151 16     Temp 05/01/23 0153  98 F (36.7 C)     Temp Source 05/01/23 0151 Oral     SpO2 05/01/23 0151 100 %     Weight 05/01/23 0150 160 lb (72.6 kg)     Height 05/01/23 0150 5\' 5"  (1.651 m)     Head Circumference --      Peak Flow --      Pain Score 05/01/23 0150 8     Pain Loc --      Pain Education --      Exclude from Growth Chart --     Updated Vital Signs: BP 127/62   Pulse 70   Temp 98 F (36.7 C) (Oral)   Resp 16   Ht 5\' 5"  (1.651 m)   Wt 72.6 kg   SpO2 100%   BMI 26.63 kg/m    General: Awake, no distress.  CV:  RRR.  Good peripheral perfusion.  Resp:  Normal effort.  CTAB.  Abd:  No distention.  Other:  Head is atraumatic.  PERRL.  EOMI.  Nose is atraumatic.  No dental malocclusion.  Midline cervical spine tenderness to palpation.  No step-offs or deformities noted.  Alert and oriented x 3.  CN II-XII grossly intact.  5/5 motor strength and sensation all extremities.  M AE x 4.   ED Results / Procedures / Treatments  Labs (all labs ordered are listed, but only abnormal results are displayed) Labs Reviewed  CBC WITH DIFFERENTIAL/PLATELET - Abnormal; Notable for the following components:      Result Value   Hemoglobin 11.8 (*)    All other components within normal limits  BASIC METABOLIC PANEL - Abnormal; Notable for the following components:   Glucose, Bld 139 (*)    BUN 24 (*)    Creatinine, Ser 1.25 (*)    Calcium 10.9 (*)    GFR, Estimated 42 (*)    All other components within normal limits  CK  URINALYSIS, ROUTINE W REFLEX MICROSCOPIC  TROPONIN I (HIGH SENSITIVITY)     EKG  ED ECG REPORT I, Jomar Denz J, the attending physician, personally viewed and interpreted this ECG.   Date: 05/01/2023  EKG Time: 0154  Rate: 67  Rhythm: normal sinus rhythm  Axis: LAD  Intervals:none  ST&T Change: Nonspecific    RADIOLOGY I have independently visualized and interpreted patient's imaging studies as well as noted the radiology interpretation:  Left shoulder: No acute  fracture/dislocation  CT head: No ICH  CT cervical spine: No acute traumatic injury; stable atlantodental widening interval appears mildly progressed since 2019, recommend MRI  Official radiology report(s): DG Shoulder Left Result Date: 05/01/2023 CLINICAL DATA:  Left shoulder pain since fall last night EXAM: LEFT SHOULDER - 2+ VIEW COMPARISON:  None Available. FINDINGS: No acute fracture or dislocation. Degenerative changes about the [facility] and glenohumeral joints. IMPRESSION: No acute fracture or dislocation. Electronically Signed   By: Minerva Fester M.D.   On: 05/01/2023 03:37   CT Head Wo Contrast Result Date: 05/01/2023 CLINICAL DATA:  Head trauma, minor (Age >= 65y); Facial trauma, blunt EXAM: CT HEAD WITHOUT CONTRAST CT CERVICAL SPINE WITHOUT CONTRAST TECHNIQUE: Multidetector CT imaging of the head and cervical spine was performed following the standard protocol without intravenous contrast. Multiplanar CT image reconstructions of the cervical spine were also generated. RADIATION DOSE REDUCTION: This exam was performed according to the departmental dose-optimization program which includes automated exposure control, adjustment of the mA and/or kV according to patient size and/or use of iterative reconstruction technique. COMPARISON:  03/27/2017 CT head CT cervical spine FINDINGS: CT HEAD FINDINGS Brain: No evidence of acute infarction, hemorrhage, hydrocephalus, extra-axial collection or mass lesion/mass effect. Patchy white matter hypodensities, compatible with chronic microvascular ischemic disease. Vascular: Calcific atherosclerosis.  No hyperdense vessel. Skull: No acute fracture. Sinuses/Orbits: Clear sinuses.  No acute orbital findings. Other: No mastoid effusions. CT CERVICAL SPINE FINDINGS Alignment: Widening of the atlantodental interval (4 mm) appears mildly progressed in comparison to 2019. Skull base and vertebrae: Vertebral body heights are maintained. No evidence of acute fracture. Soft  tissues and spinal canal: No prevertebral fluid or swelling. No visible canal hematoma. Disc levels: Severe craniocervical degenerative change including left C1-C2 degenerative changes with joint space height loss. Degenerative soft tissue pannus along the dens, partially calcified. Moderate to severe multilevel degenerative disc disease and bridging anterior osteophytes. Resulting potentially severe C1-C2 foraminal stenosis and likely moderate canal stenosis. Upper chest: Visualized lung apices are clear. IMPRESSION: 1. No evidence  of acute abnormality intracranially or in the cervical spine. 2. Widening of the atlantodental interval (4 mm) appears mildly progressed in comparison to 2019. 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 Resulting potentially severe C1-C2 foraminal stenosis and likely moderate canal stenosis. An MRI of the cervical spine could further characterize if clinically warranted. Electronically Signed   By: Feliberto Harts M.D.   On: 05/01/2023 02:56   CT Cervical Spine Wo Contrast Result Date: 05/01/2023 CLINICAL DATA:  Head trauma, minor (Age >= 65y); Facial trauma, blunt EXAM: CT HEAD WITHOUT CONTRAST CT CERVICAL SPINE WITHOUT CONTRAST TECHNIQUE: Multidetector CT imaging of the head and cervical spine was performed following the standard protocol without intravenous contrast. Multiplanar CT image reconstructions of the cervical spine were also generated. RADIATION DOSE REDUCTION: This exam was performed according to the departmental dose-optimization program which includes automated exposure control, adjustment of the mA and/or kV according to patient size and/or use of iterative reconstruction technique. COMPARISON:  03/27/2017 CT head CT cervical spine FINDINGS: CT HEAD FINDINGS Brain: No evidence of acute infarction, hemorrhage, hydrocephalus, extra-axial collection or mass lesion/mass effect.  Patchy white matter hypodensities, compatible with chronic microvascular ischemic disease. Vascular: Calcific atherosclerosis.  No hyperdense vessel. Skull: No acute fracture. Sinuses/Orbits: Clear sinuses.  No acute orbital findings. Other: No mastoid effusions. CT CERVICAL SPINE FINDINGS Alignment: Widening of the atlantodental interval (4 mm) appears mildly progressed in comparison to 2019. Skull base and vertebrae: Vertebral body heights are maintained. No evidence of acute fracture. Soft tissues and spinal canal: No prevertebral fluid or swelling. No visible canal hematoma. Disc levels: Severe craniocervical degenerative change including left C1-C2 degenerative changes with joint space height loss. Degenerative soft tissue pannus along the dens, partially calcified. Moderate to severe multilevel degenerative disc disease and bridging anterior osteophytes. Resulting potentially severe C1-C2 foraminal stenosis and likely moderate canal stenosis. Upper chest: Visualized lung apices are clear. IMPRESSION: 1. No evidence of acute abnormality intracranially or in the cervical spine. 2. Widening of the atlantodental interval (4 mm) appears mildly progressed in comparison to 2019. 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 Resulting potentially severe C1-C2 foraminal stenosis and likely moderate canal stenosis. An MRI of the cervical spine could further characterize if clinically warranted. Electronically Signed   By: Feliberto Harts M.D.   On: 05/01/2023 02:56     PROCEDURES:  Critical Care performed: No  Procedures   MEDICATIONS ORDERED IN ED: Medications - No data to display   IMPRESSION / MDM / ASSESSMENT AND PLAN / ED COURSE  I reviewed the triage vital signs and the nursing notes.                             89 year old female brought for fall.  Differential diagnosis includes but is not limited to ICH,  cervical spine injury, ACS, infectious, metabolic etiologies, etc.  I have personally reviewed patient's records and note a nephrology office visit on 02/19/2023 for CKD, monoclonal gammopathy, hypercalcemia.  Patient's presentation is most consistent with acute complicated illness / injury requiring diagnostic workup.  Laboratory results demonstrate improved kidney function from prior, unremarkable CK.  Strong odor of urine about patient; will check UA, troponin.  Based on CT cervical spine reading, will obtain MRI cervical spine.  Administer Norco and reassess.  Clinical Course as of 05/01/23 2952  Tue May 01, 2023  0701  Care transferred to Dr. Arnoldo Morale at change of shift pending MRI, troponin and UA. [JS]  0702 Fall - no syncope. UA, MRI, trop pending. Given norco for pain control.  MRI for abnormal Ct read [SM]    Clinical Course User Index [JS] Irean Hong, MD [SM] Corena Herter, MD     FINAL CLINICAL IMPRESSION(S) / ED DIAGNOSES   Final diagnoses:  Fall, initial encounter  Neck pain     Rx / DC Orders   ED Discharge Orders     None        Note:  This document was prepared using Dragon voice recognition software and may include unintentional dictation errors.   Irean Hong, MD 05/01/23 [phone]

## 2023-05-01 NOTE — ED Triage Notes (Addendum)
EMS brings pt in from home; fell at 10pm tonight but does not recall if she hit her head, c/o left shoulder pain and "neck stiffness"; EMS st pt unable to get up unassisted but able to ambulate with walker once she was assisted up

## 2023-05-01 NOTE — ED Notes (Signed)
Pt assisted to restroom via wc and back to stretcher pt aox4 speaking In full clear sentences urine collected @ bs

## 2023-05-01 NOTE — ED Provider Notes (Addendum)
Care assumed of patient from outgoing provider.  See their note for initial history, exam and plan.  Clinical Course as of 05/01/23 1610  Tue May 01, 2023  0701 Care transferred to Dr. Arnoldo Morale at change of shift pending MRI, troponin and UA. [JS]  0702 Fall - no syncope. UA, MRI, trop pending. Given norco for pain control.  MRI for abnormal Ct read [SM]    Clinical Course User Index [JS] Irean Hong, MD [SM] Corena Herter, MD   MRI with no ligamentous injury.  Read as significant arthritis with C2 impingement.  Patient with good grip strength and motor function to upper extremities.  No numbness or weakness.  Discussed close follow-up with primary care physician and given information to follow-up with spine specialist for her severe arthritis in her neck.  Findings concerning for possible urinary tract infection.  Patient has an allergy listed for penicillin that caused itching after my conversation.  Have a low suspicion for anaphylactic reaction.  After discussion with the pharmacist will order IM Rocephin and started on Keflex.  Urine culture was sent.  Discussed return precautions.  Corena Herter, MD 05/01/23 [id number]    Corena Herter, MD 05/01/23 [phone]

## 2023-05-01 NOTE — ED Notes (Signed)
Pt discharged by [facility], RN who reported to me that while she was helping the patient get dressed in the bathroom, they found a yellow gold ring with diamonds and the patient confirmed this to be her ring. Pt took the ring home.

## 2023-05-01 NOTE — ED Notes (Signed)
Pt requires walker for ambulation lives @ home alone

## 2023-05-01 NOTE — ED Notes (Signed)
Night shift RN notified this RN that MRI called to report the patient alleges a missing yellow gold ring with diamonds that was on her pinky finger. Upon pt return from MRI, she informed me she was in triage/WR, then went to X-ray, then returned to the WR prior to coming to the bed in 18H. Night shift RN shook out patient's blankets. Pt reports the ring is not in her purse that is with her. This RN searched the triage and WR area. I notified security in the WR of the missing ring. I notified the charge nurse, Herbert Seta, of the missing ring. I called x-ray and the technician searched the area, but did not find it.

## 2023-05-01 NOTE — ED Notes (Signed)
Pt provided 3 warm blankets for comfort 2 blankets removed and placed @ the end of the stretcher as no linen cart available to 18 hall pt is discussing family, friends,  personal travels and interests in further travel

## 2023-05-03 LAB — URINE CULTURE: Culture: >=100000 — AB

## 2023-05-21 ENCOUNTER — Other Ambulatory Visit (HOSPITAL_COMMUNITY): Payer: Self-pay

## 2023-05-25 DIAGNOSIS — J449 Chronic obstructive pulmonary disease, unspecified: Secondary | ICD-10-CM | POA: Diagnosis not present

## 2023-05-25 DIAGNOSIS — I129 Hypertensive chronic kidney disease with stage 1 through stage 4 chronic kidney disease, or unspecified chronic kidney disease: Secondary | ICD-10-CM | POA: Diagnosis not present

## 2023-05-25 DIAGNOSIS — E039 Hypothyroidism, unspecified: Secondary | ICD-10-CM | POA: Diagnosis not present

## 2023-05-25 DIAGNOSIS — Z0000 Encounter for general adult medical examination without abnormal findings: Secondary | ICD-10-CM | POA: Diagnosis not present

## 2023-05-25 DIAGNOSIS — N1832 Chronic kidney disease, stage 3b: Secondary | ICD-10-CM | POA: Diagnosis not present

## 2023-05-25 DIAGNOSIS — Z87891 Personal history of nicotine dependence: Secondary | ICD-10-CM | POA: Diagnosis not present

## 2023-05-25 DIAGNOSIS — E1122 Type 2 diabetes mellitus with diabetic chronic kidney disease: Secondary | ICD-10-CM | POA: Diagnosis not present

## 2023-05-25 DIAGNOSIS — N183 Chronic kidney disease, stage 3 unspecified: Secondary | ICD-10-CM | POA: Diagnosis not present

## 2023-05-25 DIAGNOSIS — Z1331 Encounter for screening for depression: Secondary | ICD-10-CM | POA: Diagnosis not present

## 2023-05-25 NOTE — Progress Notes (Signed)
 Referring Physician:  Corena Herter, MD [address] [place],  [place] [address]  Primary Physician:  Gracelyn Nurse, MD  History of Present Illness: 05/28/2023 Sherry [name] is here today with a chief complaint of neck pain and bilateral shoulder pain.  This is present at baseline but has become significantly worse after suffering from a fall about a month ago.  She states the pain extends to primarily her shoulders at times will go down to her elbows.  She unable to say if it is worse on 1 arm compared to the other but also feels as though she has some pain into her jaw and back of her head.  She feels weak all over and is concerned with feeling dizzy often and having falls frequently.  She is unable to answer whether her gait has significantly changed, but states that since she has felt more weak recently she has been using a wheelchair as opposed to a cane.   Severity: 9/10  Modifying factors: made better by sitting Weakness: none Timing: constant Bowel/Bladder Dysfunction: none  Conservative measures:  Physical therapy: has not participated in Multimodal medical therapy including regular antiinflammatories: gabapentin  Injections: no epidural steroid injections  Past Surgery: no spinal surgeries   The symptoms are causing a significant impact on the patient's life.   Review of Systems:  A 10 point review of systems is negative, except for the pertinent positives and negatives detailed in the HPI.  Past Medical History: Past Medical History:  Diagnosis Date   Allergy    Diabetes mellitus without complication (HCC)    Renal insufficiency    Thyroid disease     Past Surgical History: Past Surgical History:  Procedure Laterality Date   CHOLECYSTECTOMY      Allergies: Allergies as of 05/28/2023 - Review Complete 05/28/2023  Allergen Reaction Noted   Penicillins Swelling and Rash 04/28/2015    Medications: Outpatient Encounter Medications as of  05/28/2023  Medication Sig   ACCU-CHEK AVIVA PLUS test strip    albuterol (VENTOLIN HFA) 108 (90 Base) MCG/ACT inhaler Inhale 2 puffs into the lungs every 6 (six) hours as needed.   atenolol (TENORMIN) 25 MG tablet Take 25 mg by mouth daily.   Blood Glucose Calibration (ACCU-CHEK AVIVA) SOLN    Blood Glucose Monitoring Suppl (ACCU-CHEK AVIVA PLUS) w/Device KIT See admin instructions.   diclofenac Sodium (VOLTAREN) 1 % GEL Apply 2 g topically as needed.   estradiol (ESTRACE) 0.5 MG tablet Take 0.5 mg by mouth daily.    levothyroxine (SYNTHROID, LEVOTHROID) 25 MCG tablet TAKE 25 mcg TABLET EVERY DAY ON AN EMPTY STOMACH WITH A GLASS OF WATER AT LEAST 30 TO 60 MINUTES BEFORE BREAKFAST (Patient taking differently: Take 25 mcg by mouth daily before breakfast.)   lidocaine (LIDODERM) 5 % Place 1 patch onto the skin every 12 (twelve) hours. Remove & Discard patch within 12 hours or as directed by MD   losartan (COZAAR) 50 MG tablet Take 50 mg by mouth daily.   metFORMIN (GLUCOPHAGE) 500 MG tablet TAKE 500 mg TABLET TWICE DAILY WITH MEALS  Do not take on 1/10 or 03/30/17 due to contrast given for CT scan. Restart taking on 03/31/17. (Patient taking differently: Take 500 mg by mouth 2 (two) times daily with a meal.)   montelukast (SINGULAIR) 10 MG tablet Take 10 mg by mouth daily.   omeprazole (PRILOSEC) 40 MG capsule Take 40 mg by mouth daily.   progesterone (PROMETRIUM) 200 MG capsule Take 200  mg by mouth at bedtime.    torsemide (DEMADEX) 20 MG tablet Take 20 mg by mouth daily.   [DISCONTINUED] Bromfenac Sodium 0.07 % SOLN Place 1 drop into both eyes daily. (Patient not taking: Reported on 02/27/2020)   [DISCONTINUED] cetirizine (ZYRTEC) 10 MG tablet Take 10 mg by mouth daily. (Patient not taking: Reported on 06/14/2022)   [DISCONTINUED] COVID-19 mRNA bivalent vaccine, Pfizer, (PFIZER COVID-19 VAC BIVALENT) injection Inject into the muscle. (Patient not taking: Reported on 01/02/2022)   [DISCONTINUED]  fluconazole (DIFLUCAN) 150 MG tablet Take 150 mg by mouth once. (Patient not taking: Reported on 05/28/2023)   [DISCONTINUED] furosemide (LASIX) 40 MG tablet Take 20 mg by mouth daily as needed for fluid or edema (for ankle swelling). (Patient not taking: Reported on 12/21/2020)   [DISCONTINUED] gabapentin (NEURONTIN) 400 MG capsule Take 400 mg by mouth daily. (Patient not taking: Reported on 05/28/2023)   [DISCONTINUED] influenza vaccine adjuvanted (FLUAD QUADRIVALENT) 0.5 ML injection Inject into the muscle. (Patient not taking: Reported on 01/02/2022)   [DISCONTINUED] traMADol (ULTRAM) 50 MG tablet Take 50 mg by mouth every 12 (twelve) hours as needed for moderate pain.   No facility-administered encounter medications on file as of 05/28/2023.    Social History: Social History   Tobacco Use   Smoking status: Former    Current packs/day: 0.00    Types: Cigarettes    Quit date: 2004    Years since quitting: 21.2   Smokeless tobacco: Never  Vaping Use   Vaping status: Never Used  Substance Use Topics   Alcohol use: Not Currently   Drug use: Not Currently    Family Medical History: Family History  Problem Relation Age of Onset   Diabetes Mother    Hypertension Mother    Hypertension Father    Cancer Brother     Physical Examination:  General: Patient is well developed, well nourished, calm, collected, and in no apparent distress. Attention to examination is appropriate.  Psychiatric: Patient is non-anxious.  Head:  Pupils equal, round, and reactive to light.  ENT:  Oral mucosa appears well hydrated.  Neck:   Supple.  Full range of motion.  Respiratory: Patient is breathing without any difficulty.  Extremities: No edema.  Vascular: Palpable dorsal pedal pulses.  Skin:   On exposed skin, there are no abnormal skin lesions.  NEUROLOGICAL:     Patient does seem delayed in answering some of my questions.  She is alert and oriented, but acknowledges that she has been  having some memory issues.  Cranial Nerves: Pupils equal round and reactive to light.  Facial tone is symmetric.   ROM of spine: Decreased range of motion in her cervical spine.  Mild tenderness to palpation over cervical paraspinals.  Strength:  Largely 3-4/5 in bilateral upper extremities that was limited secondary to pain.  Reflexes are 2+ and symmetric at the biceps, triceps, brachioradialis Hoffman's is absent.  Bilateral upper and lower extremity sensation is intact to light touch.    Patient in wheelchair.  Medical Decision Making  Imaging: MRI cervical spine 05/01/2023: IMPRESSION: 1. No acute ligamentous injury detected. C1-2 malalignment is associated with severe left facet arthritis with underlying degenerative edema and left C2 impingement. No cervicomedullary impingement. 2. Generalized cervical spine degeneration with multilevel foraminal impingement.  CT cervical spine:  IMPRESSION: 1. No evidence of acute abnormality intracranially or in the cervical spine. 2. Widening of the atlantodental interval (4 mm) appears mildly progressed in comparison to 2019. Given chronicity, this likely represents  atlantoaxial instability/laxity in the setting of severe craniocervical degenerative change. Resulting potentially severe C1-C2 foraminal stenosis and likely moderate canal stenosis. An MRI of the cervical spine could further characterize if clinically warranted.     I have personally reviewed the images and agree with the above interpretation.  Assessment and Plan: Sherry [name] is a pleasant 89 y.o. female  is here today with a chief complaint of neck pain and bilateral shoulder pain.  This is present at baseline but has become significantly worse after suffering from a fall about a month ago.  She states the pain extends to primarily her shoulders at times will go down to her elbows.  She unable to say if it is worse on 1 arm compared to the other but also feels as  though she has some pain into her jaw and back of her head.  She feels weak all over and is concerned with feeling dizzy often and having falls frequently.  She is unable to answer whether her gait has significantly changed, but states that since she has felt more weak recently she has been using a wheelchair as opposed to a cane.  MRI shows severe arthritis as well as left C2 impingement including expected generalized degenerative changes and multilevel foraminal impingement.  On examination patient does have some tenderness to palpation.  She is also weak in bilateral upper extremities.  Imaging was reviewed with her.  She also demonstrated some issues with her memory which she acknowledged.  Patient states that she would not want any surgery and would just like her neck to try to feel better.  Her biggest concern is not having help around her home and potential falls while she is alone at home.  I have ordered home health for her for both physical therapy to increase strength as well as a home health aide to help with activities of daily living.  In addition, I have also sent a referral to our geriatric team for her to be established with them.  Lastly, patient does have some renal insufficiency would like her PCP or geriatric team to be on board with pain medicines.  However, I did order lidocaine patches for her to place on her neck to help with her pain in the meantime.  Plan to see back in the next 6 to 8 weeks.      Thank you for involving me in the care of this patient.   Joan Flores, PA-C Dept. of Neurosurgery

## 2023-05-28 ENCOUNTER — Other Ambulatory Visit (HOSPITAL_COMMUNITY): Payer: Self-pay

## 2023-05-28 ENCOUNTER — Ambulatory Visit (INDEPENDENT_AMBULATORY_CARE_PROVIDER_SITE_OTHER): Payer: Medicare HMO | Admitting: Physician Assistant

## 2023-05-28 VITALS — BP 130/78 | Ht 65.0 in | Wt 172.0 lb

## 2023-05-28 DIAGNOSIS — M25512 Pain in left shoulder: Secondary | ICD-10-CM | POA: Diagnosis not present

## 2023-05-28 DIAGNOSIS — M25511 Pain in right shoulder: Secondary | ICD-10-CM

## 2023-05-28 DIAGNOSIS — W19XXXA Unspecified fall, initial encounter: Secondary | ICD-10-CM | POA: Diagnosis not present

## 2023-05-28 DIAGNOSIS — M5412 Radiculopathy, cervical region: Secondary | ICD-10-CM

## 2023-05-28 DIAGNOSIS — R413 Other amnesia: Secondary | ICD-10-CM

## 2023-05-28 DIAGNOSIS — R531 Weakness: Secondary | ICD-10-CM | POA: Diagnosis not present

## 2023-05-28 DIAGNOSIS — M48062 Spinal stenosis, lumbar region with neurogenic claudication: Secondary | ICD-10-CM | POA: Diagnosis not present

## 2023-05-28 DIAGNOSIS — R42 Dizziness and giddiness: Secondary | ICD-10-CM | POA: Diagnosis not present

## 2023-05-28 DIAGNOSIS — R6884 Jaw pain: Secondary | ICD-10-CM

## 2023-05-28 DIAGNOSIS — M5030 Other cervical disc degeneration, unspecified cervical region: Secondary | ICD-10-CM

## 2023-05-28 DIAGNOSIS — M5031 Other cervical disc degeneration,  high cervical region: Secondary | ICD-10-CM

## 2023-05-28 DIAGNOSIS — H9203 Otalgia, bilateral: Secondary | ICD-10-CM

## 2023-05-28 MED ORDER — LIDOCAINE 5 % EX PTCH: 1.0000 | MEDICATED_PATCH | Freq: Two times a day (BID) | CUTANEOUS | 11 refills | Status: DC

## 2023-06-01 ENCOUNTER — Telehealth: Payer: Self-pay | Admitting: Physician Assistant

## 2023-06-01 NOTE — Telephone Encounter (Signed)
 Patient seen Sherry [name] on 05/28/23 and she prescribed her lidocaine patches. [facility] will not send her out the patches because Brooke did not sigh the prescription. Patient has been waiting on this prescription every since. Can someone please call and see what they are needed.

## 2023-06-01 NOTE — Telephone Encounter (Signed)
 I spoke to the pharmacy and they needed clarification on the RX. I spoke to [place] and the RX should read apply 1 patch for 12 hours on and 12 hours off. I called [facility] back and gave a verbal order. Patient should be getting the patches in the mail soon.

## 2023-06-18 ENCOUNTER — Other Ambulatory Visit (HOSPITAL_COMMUNITY): Payer: Self-pay

## 2023-06-28 ENCOUNTER — Other Ambulatory Visit: Payer: Self-pay

## 2023-06-28 ENCOUNTER — Ambulatory Visit
Admission: RE | Admit: 2023-06-28 | Discharge: 2023-06-28 | Disposition: A | Discharge status: Home / Self Care | Source: Ambulatory Visit | Attending: Orthopedic Surgery | Admitting: Orthopedic Surgery

## 2023-06-28 ENCOUNTER — Ambulatory Visit (INDEPENDENT_AMBULATORY_CARE_PROVIDER_SITE_OTHER): Admitting: Orthopedic Surgery

## 2023-06-28 VITALS — BP 122/82 | Ht 65.0 in | Wt 172.0 lb

## 2023-06-28 DIAGNOSIS — M5412 Radiculopathy, cervical region: Secondary | ICD-10-CM

## 2023-06-28 DIAGNOSIS — M25511 Pain in right shoulder: Secondary | ICD-10-CM

## 2023-06-28 DIAGNOSIS — I890 Lymphedema, not elsewhere classified: Secondary | ICD-10-CM | POA: Diagnosis not present

## 2023-06-28 DIAGNOSIS — M532X1 Spinal instabilities, occipito-atlanto-axial region: Secondary | ICD-10-CM | POA: Diagnosis not present

## 2023-06-28 DIAGNOSIS — M5030 Other cervical disc degeneration, unspecified cervical region: Secondary | ICD-10-CM | POA: Diagnosis not present

## 2023-06-28 DIAGNOSIS — M4802 Spinal stenosis, cervical region: Secondary | ICD-10-CM

## 2023-06-28 DIAGNOSIS — M47812 Spondylosis without myelopathy or radiculopathy, cervical region: Secondary | ICD-10-CM

## 2023-06-28 DIAGNOSIS — M4722 Other spondylosis with radiculopathy, cervical region: Secondary | ICD-10-CM | POA: Diagnosis not present

## 2023-06-28 NOTE — Patient Instructions (Addendum)
 It was so nice to see you today. Thank you so much for coming in.    You have a lot of arthritis in your neck along with some instability at C1-C2.   The instability can be fixed with a surgery (fusion of C1-C2). You may still have some neck pain from your arthritis.   We can also consider injections in your neck to help with the pain.   I will look into home health PT and send some orders to another company. You saw Frances Furbish previously.   I will send a message to Dr. Letitia Libra about your leg swelling and further treatment options.   I think your right shoulder pain is from your shoulder. I want you to see ortho at the [facility] clinic for evaluation of your shoulders. I want you to see Micah Noel. They should call you to schedule an appointment or you can call them at [phone].   Nehemiah Settle will see you back in 6-8 weeks. Please do not hesitate to call if you have any questions or concerns. You can also message me in MyChart.   Drake Leach PA-C [phone]     The physicians and staff at [facility] at [facility] are committed to providing excellent care. You may receive a survey asking for feedback about your experience at our office. We value you your feedback and appreciate you taking the time to to fill it out. The [facility] leadership team is also available to discuss your experience in person, feel free to contact us [phone].
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
## 2023-06-29 ENCOUNTER — Encounter: Payer: Self-pay | Admitting: Orthopedic Surgery

## 2023-07-04 ENCOUNTER — Ambulatory Visit: Admitting: Physician Assistant

## 2023-07-04 ENCOUNTER — Telehealth: Payer: Self-pay | Admitting: Orthopedic Surgery

## 2023-07-04 DIAGNOSIS — N183 Chronic kidney disease, stage 3 unspecified: Secondary | ICD-10-CM | POA: Diagnosis not present

## 2023-07-04 DIAGNOSIS — M4722 Other spondylosis with radiculopathy, cervical region: Secondary | ICD-10-CM | POA: Diagnosis not present

## 2023-07-04 DIAGNOSIS — E213 Hyperparathyroidism, unspecified: Secondary | ICD-10-CM | POA: Diagnosis not present

## 2023-07-04 DIAGNOSIS — I10 Essential (primary) hypertension: Secondary | ICD-10-CM | POA: Diagnosis not present

## 2023-07-04 DIAGNOSIS — E039 Hypothyroidism, unspecified: Secondary | ICD-10-CM | POA: Diagnosis not present

## 2023-07-04 DIAGNOSIS — E1122 Type 2 diabetes mellitus with diabetic chronic kidney disease: Secondary | ICD-10-CM | POA: Diagnosis not present

## 2023-07-04 DIAGNOSIS — M25511 Pain in right shoulder: Secondary | ICD-10-CM | POA: Diagnosis not present

## 2023-07-04 DIAGNOSIS — M532X1 Spinal instabilities, occipito-atlanto-axial region: Secondary | ICD-10-CM | POA: Diagnosis not present

## 2023-07-04 DIAGNOSIS — M4802 Spinal stenosis, cervical region: Secondary | ICD-10-CM | POA: Diagnosis not present

## 2023-07-04 NOTE — Telephone Encounter (Signed)
 Sherry [name] from [place] is calling to request verbal orders.  Physical Therapy  2 w 4 1 w 2  Please call her at [phone], OK to leave a voicemail

## 2023-07-05 DIAGNOSIS — M4802 Spinal stenosis, cervical region: Secondary | ICD-10-CM | POA: Diagnosis not present

## 2023-07-05 DIAGNOSIS — E1122 Type 2 diabetes mellitus with diabetic chronic kidney disease: Secondary | ICD-10-CM | POA: Diagnosis not present

## 2023-07-05 DIAGNOSIS — E213 Hyperparathyroidism, unspecified: Secondary | ICD-10-CM | POA: Diagnosis not present

## 2023-07-05 DIAGNOSIS — I10 Essential (primary) hypertension: Secondary | ICD-10-CM | POA: Diagnosis not present

## 2023-07-05 DIAGNOSIS — M532X1 Spinal instabilities, occipito-atlanto-axial region: Secondary | ICD-10-CM | POA: Diagnosis not present

## 2023-07-05 DIAGNOSIS — M25511 Pain in right shoulder: Secondary | ICD-10-CM | POA: Diagnosis not present

## 2023-07-05 DIAGNOSIS — M4722 Other spondylosis with radiculopathy, cervical region: Secondary | ICD-10-CM | POA: Diagnosis not present

## 2023-07-05 DIAGNOSIS — E039 Hypothyroidism, unspecified: Secondary | ICD-10-CM | POA: Diagnosis not present

## 2023-07-05 DIAGNOSIS — N183 Chronic kidney disease, stage 3 unspecified: Secondary | ICD-10-CM | POA: Diagnosis not present

## 2023-07-05 NOTE — Telephone Encounter (Signed)
 Left voicemail with Samson Croak from [place] to confirm verbal orders as requested.

## 2023-07-06 DIAGNOSIS — I10 Essential (primary) hypertension: Secondary | ICD-10-CM | POA: Diagnosis not present

## 2023-07-06 DIAGNOSIS — M532X1 Spinal instabilities, occipito-atlanto-axial region: Secondary | ICD-10-CM | POA: Diagnosis not present

## 2023-07-06 DIAGNOSIS — E1122 Type 2 diabetes mellitus with diabetic chronic kidney disease: Secondary | ICD-10-CM | POA: Diagnosis not present

## 2023-07-06 DIAGNOSIS — M4802 Spinal stenosis, cervical region: Secondary | ICD-10-CM | POA: Diagnosis not present

## 2023-07-06 DIAGNOSIS — N183 Chronic kidney disease, stage 3 unspecified: Secondary | ICD-10-CM | POA: Diagnosis not present

## 2023-07-06 DIAGNOSIS — M25511 Pain in right shoulder: Secondary | ICD-10-CM | POA: Diagnosis not present

## 2023-07-06 DIAGNOSIS — E039 Hypothyroidism, unspecified: Secondary | ICD-10-CM | POA: Diagnosis not present

## 2023-07-06 DIAGNOSIS — E213 Hyperparathyroidism, unspecified: Secondary | ICD-10-CM | POA: Diagnosis not present

## 2023-07-06 DIAGNOSIS — M4722 Other spondylosis with radiculopathy, cervical region: Secondary | ICD-10-CM | POA: Diagnosis not present

## 2023-07-09 DIAGNOSIS — H538 Other visual disturbances: Secondary | ICD-10-CM | POA: Diagnosis not present

## 2023-07-09 DIAGNOSIS — E119 Type 2 diabetes mellitus without complications: Secondary | ICD-10-CM | POA: Diagnosis not present

## 2023-07-09 DIAGNOSIS — H01003 Unspecified blepharitis right eye, unspecified eyelid: Secondary | ICD-10-CM | POA: Diagnosis not present

## 2023-07-10 DIAGNOSIS — I10 Essential (primary) hypertension: Secondary | ICD-10-CM | POA: Diagnosis not present

## 2023-07-10 DIAGNOSIS — N183 Chronic kidney disease, stage 3 unspecified: Secondary | ICD-10-CM | POA: Diagnosis not present

## 2023-07-10 DIAGNOSIS — M4802 Spinal stenosis, cervical region: Secondary | ICD-10-CM | POA: Diagnosis not present

## 2023-07-10 DIAGNOSIS — M532X1 Spinal instabilities, occipito-atlanto-axial region: Secondary | ICD-10-CM | POA: Diagnosis not present

## 2023-07-10 DIAGNOSIS — M4722 Other spondylosis with radiculopathy, cervical region: Secondary | ICD-10-CM | POA: Diagnosis not present

## 2023-07-10 DIAGNOSIS — E039 Hypothyroidism, unspecified: Secondary | ICD-10-CM | POA: Diagnosis not present

## 2023-07-10 DIAGNOSIS — M25511 Pain in right shoulder: Secondary | ICD-10-CM | POA: Diagnosis not present

## 2023-07-10 DIAGNOSIS — E1122 Type 2 diabetes mellitus with diabetic chronic kidney disease: Secondary | ICD-10-CM | POA: Diagnosis not present

## 2023-07-10 DIAGNOSIS — E213 Hyperparathyroidism, unspecified: Secondary | ICD-10-CM | POA: Diagnosis not present

## 2023-07-11 DIAGNOSIS — N183 Chronic kidney disease, stage 3 unspecified: Secondary | ICD-10-CM | POA: Diagnosis not present

## 2023-07-11 DIAGNOSIS — M4722 Other spondylosis with radiculopathy, cervical region: Secondary | ICD-10-CM | POA: Diagnosis not present

## 2023-07-11 DIAGNOSIS — E213 Hyperparathyroidism, unspecified: Secondary | ICD-10-CM | POA: Diagnosis not present

## 2023-07-11 DIAGNOSIS — M532X1 Spinal instabilities, occipito-atlanto-axial region: Secondary | ICD-10-CM | POA: Diagnosis not present

## 2023-07-11 DIAGNOSIS — E039 Hypothyroidism, unspecified: Secondary | ICD-10-CM | POA: Diagnosis not present

## 2023-07-11 DIAGNOSIS — E1122 Type 2 diabetes mellitus with diabetic chronic kidney disease: Secondary | ICD-10-CM | POA: Diagnosis not present

## 2023-07-11 DIAGNOSIS — M4802 Spinal stenosis, cervical region: Secondary | ICD-10-CM | POA: Diagnosis not present

## 2023-07-11 DIAGNOSIS — I10 Essential (primary) hypertension: Secondary | ICD-10-CM | POA: Diagnosis not present

## 2023-07-11 DIAGNOSIS — M25511 Pain in right shoulder: Secondary | ICD-10-CM | POA: Diagnosis not present

## 2023-07-12 DIAGNOSIS — E1122 Type 2 diabetes mellitus with diabetic chronic kidney disease: Secondary | ICD-10-CM | POA: Diagnosis not present

## 2023-07-12 DIAGNOSIS — E039 Hypothyroidism, unspecified: Secondary | ICD-10-CM | POA: Diagnosis not present

## 2023-07-12 DIAGNOSIS — E213 Hyperparathyroidism, unspecified: Secondary | ICD-10-CM | POA: Diagnosis not present

## 2023-07-12 DIAGNOSIS — M532X1 Spinal instabilities, occipito-atlanto-axial region: Secondary | ICD-10-CM | POA: Diagnosis not present

## 2023-07-12 DIAGNOSIS — N183 Chronic kidney disease, stage 3 unspecified: Secondary | ICD-10-CM | POA: Diagnosis not present

## 2023-07-12 DIAGNOSIS — M25511 Pain in right shoulder: Secondary | ICD-10-CM | POA: Diagnosis not present

## 2023-07-12 DIAGNOSIS — I10 Essential (primary) hypertension: Secondary | ICD-10-CM | POA: Diagnosis not present

## 2023-07-12 DIAGNOSIS — M4802 Spinal stenosis, cervical region: Secondary | ICD-10-CM | POA: Diagnosis not present

## 2023-07-12 DIAGNOSIS — M4722 Other spondylosis with radiculopathy, cervical region: Secondary | ICD-10-CM | POA: Diagnosis not present

## 2023-07-13 DIAGNOSIS — E213 Hyperparathyroidism, unspecified: Secondary | ICD-10-CM | POA: Diagnosis not present

## 2023-07-13 DIAGNOSIS — M4722 Other spondylosis with radiculopathy, cervical region: Secondary | ICD-10-CM | POA: Diagnosis not present

## 2023-07-13 DIAGNOSIS — E039 Hypothyroidism, unspecified: Secondary | ICD-10-CM | POA: Diagnosis not present

## 2023-07-13 DIAGNOSIS — M25511 Pain in right shoulder: Secondary | ICD-10-CM | POA: Diagnosis not present

## 2023-07-13 DIAGNOSIS — M4802 Spinal stenosis, cervical region: Secondary | ICD-10-CM | POA: Diagnosis not present

## 2023-07-13 DIAGNOSIS — E1122 Type 2 diabetes mellitus with diabetic chronic kidney disease: Secondary | ICD-10-CM | POA: Diagnosis not present

## 2023-07-13 DIAGNOSIS — I10 Essential (primary) hypertension: Secondary | ICD-10-CM | POA: Diagnosis not present

## 2023-07-13 DIAGNOSIS — N183 Chronic kidney disease, stage 3 unspecified: Secondary | ICD-10-CM | POA: Diagnosis not present

## 2023-07-13 DIAGNOSIS — M532X1 Spinal instabilities, occipito-atlanto-axial region: Secondary | ICD-10-CM | POA: Diagnosis not present

## 2023-07-17 DIAGNOSIS — I10 Essential (primary) hypertension: Secondary | ICD-10-CM | POA: Diagnosis not present

## 2023-07-17 DIAGNOSIS — M532X1 Spinal instabilities, occipito-atlanto-axial region: Secondary | ICD-10-CM | POA: Diagnosis not present

## 2023-07-17 DIAGNOSIS — M4802 Spinal stenosis, cervical region: Secondary | ICD-10-CM | POA: Diagnosis not present

## 2023-07-17 DIAGNOSIS — M25511 Pain in right shoulder: Secondary | ICD-10-CM | POA: Diagnosis not present

## 2023-07-17 DIAGNOSIS — E213 Hyperparathyroidism, unspecified: Secondary | ICD-10-CM | POA: Diagnosis not present

## 2023-07-17 DIAGNOSIS — M4722 Other spondylosis with radiculopathy, cervical region: Secondary | ICD-10-CM | POA: Diagnosis not present

## 2023-07-17 DIAGNOSIS — E039 Hypothyroidism, unspecified: Secondary | ICD-10-CM | POA: Diagnosis not present

## 2023-07-17 DIAGNOSIS — E1122 Type 2 diabetes mellitus with diabetic chronic kidney disease: Secondary | ICD-10-CM | POA: Diagnosis not present

## 2023-07-17 DIAGNOSIS — N183 Chronic kidney disease, stage 3 unspecified: Secondary | ICD-10-CM | POA: Diagnosis not present

## 2023-07-18 DIAGNOSIS — M25511 Pain in right shoulder: Secondary | ICD-10-CM | POA: Diagnosis not present

## 2023-07-18 DIAGNOSIS — E039 Hypothyroidism, unspecified: Secondary | ICD-10-CM | POA: Diagnosis not present

## 2023-07-18 DIAGNOSIS — E1122 Type 2 diabetes mellitus with diabetic chronic kidney disease: Secondary | ICD-10-CM | POA: Diagnosis not present

## 2023-07-18 DIAGNOSIS — I10 Essential (primary) hypertension: Secondary | ICD-10-CM | POA: Diagnosis not present

## 2023-07-18 DIAGNOSIS — M532X1 Spinal instabilities, occipito-atlanto-axial region: Secondary | ICD-10-CM | POA: Diagnosis not present

## 2023-07-18 DIAGNOSIS — E213 Hyperparathyroidism, unspecified: Secondary | ICD-10-CM | POA: Diagnosis not present

## 2023-07-18 DIAGNOSIS — M4722 Other spondylosis with radiculopathy, cervical region: Secondary | ICD-10-CM | POA: Diagnosis not present

## 2023-07-18 DIAGNOSIS — N183 Chronic kidney disease, stage 3 unspecified: Secondary | ICD-10-CM | POA: Diagnosis not present

## 2023-07-18 DIAGNOSIS — M4802 Spinal stenosis, cervical region: Secondary | ICD-10-CM | POA: Diagnosis not present

## 2023-07-19 DIAGNOSIS — E213 Hyperparathyroidism, unspecified: Secondary | ICD-10-CM | POA: Diagnosis not present

## 2023-07-19 DIAGNOSIS — M532X1 Spinal instabilities, occipito-atlanto-axial region: Secondary | ICD-10-CM | POA: Diagnosis not present

## 2023-07-19 DIAGNOSIS — I10 Essential (primary) hypertension: Secondary | ICD-10-CM | POA: Diagnosis not present

## 2023-07-19 DIAGNOSIS — E1122 Type 2 diabetes mellitus with diabetic chronic kidney disease: Secondary | ICD-10-CM | POA: Diagnosis not present

## 2023-07-19 DIAGNOSIS — M25511 Pain in right shoulder: Secondary | ICD-10-CM | POA: Diagnosis not present

## 2023-07-19 DIAGNOSIS — N183 Chronic kidney disease, stage 3 unspecified: Secondary | ICD-10-CM | POA: Diagnosis not present

## 2023-07-19 DIAGNOSIS — M4722 Other spondylosis with radiculopathy, cervical region: Secondary | ICD-10-CM | POA: Diagnosis not present

## 2023-07-19 DIAGNOSIS — E039 Hypothyroidism, unspecified: Secondary | ICD-10-CM | POA: Diagnosis not present

## 2023-07-19 DIAGNOSIS — M4802 Spinal stenosis, cervical region: Secondary | ICD-10-CM | POA: Diagnosis not present

## 2023-07-20 DIAGNOSIS — I10 Essential (primary) hypertension: Secondary | ICD-10-CM | POA: Diagnosis not present

## 2023-07-20 DIAGNOSIS — E039 Hypothyroidism, unspecified: Secondary | ICD-10-CM | POA: Diagnosis not present

## 2023-07-20 DIAGNOSIS — N183 Chronic kidney disease, stage 3 unspecified: Secondary | ICD-10-CM | POA: Diagnosis not present

## 2023-07-20 DIAGNOSIS — E213 Hyperparathyroidism, unspecified: Secondary | ICD-10-CM | POA: Diagnosis not present

## 2023-07-20 DIAGNOSIS — M4722 Other spondylosis with radiculopathy, cervical region: Secondary | ICD-10-CM | POA: Diagnosis not present

## 2023-07-20 DIAGNOSIS — M25511 Pain in right shoulder: Secondary | ICD-10-CM | POA: Diagnosis not present

## 2023-07-20 DIAGNOSIS — E1122 Type 2 diabetes mellitus with diabetic chronic kidney disease: Secondary | ICD-10-CM | POA: Diagnosis not present

## 2023-07-20 DIAGNOSIS — M532X1 Spinal instabilities, occipito-atlanto-axial region: Secondary | ICD-10-CM | POA: Diagnosis not present

## 2023-07-20 DIAGNOSIS — M4802 Spinal stenosis, cervical region: Secondary | ICD-10-CM | POA: Diagnosis not present

## 2023-07-25 DIAGNOSIS — E1122 Type 2 diabetes mellitus with diabetic chronic kidney disease: Secondary | ICD-10-CM | POA: Diagnosis not present

## 2023-07-25 DIAGNOSIS — M25511 Pain in right shoulder: Secondary | ICD-10-CM | POA: Diagnosis not present

## 2023-07-25 DIAGNOSIS — M4802 Spinal stenosis, cervical region: Secondary | ICD-10-CM | POA: Diagnosis not present

## 2023-07-25 DIAGNOSIS — E039 Hypothyroidism, unspecified: Secondary | ICD-10-CM | POA: Diagnosis not present

## 2023-07-25 DIAGNOSIS — M4722 Other spondylosis with radiculopathy, cervical region: Secondary | ICD-10-CM | POA: Diagnosis not present

## 2023-07-25 DIAGNOSIS — M532X1 Spinal instabilities, occipito-atlanto-axial region: Secondary | ICD-10-CM | POA: Diagnosis not present

## 2023-07-25 DIAGNOSIS — N183 Chronic kidney disease, stage 3 unspecified: Secondary | ICD-10-CM | POA: Diagnosis not present

## 2023-07-25 DIAGNOSIS — I10 Essential (primary) hypertension: Secondary | ICD-10-CM | POA: Diagnosis not present

## 2023-07-25 DIAGNOSIS — E213 Hyperparathyroidism, unspecified: Secondary | ICD-10-CM | POA: Diagnosis not present

## 2023-07-26 DIAGNOSIS — E1122 Type 2 diabetes mellitus with diabetic chronic kidney disease: Secondary | ICD-10-CM | POA: Diagnosis not present

## 2023-07-26 DIAGNOSIS — M4802 Spinal stenosis, cervical region: Secondary | ICD-10-CM | POA: Diagnosis not present

## 2023-07-26 DIAGNOSIS — M25511 Pain in right shoulder: Secondary | ICD-10-CM | POA: Diagnosis not present

## 2023-07-26 DIAGNOSIS — E039 Hypothyroidism, unspecified: Secondary | ICD-10-CM | POA: Diagnosis not present

## 2023-07-26 DIAGNOSIS — N183 Chronic kidney disease, stage 3 unspecified: Secondary | ICD-10-CM | POA: Diagnosis not present

## 2023-07-26 DIAGNOSIS — M532X1 Spinal instabilities, occipito-atlanto-axial region: Secondary | ICD-10-CM | POA: Diagnosis not present

## 2023-07-26 DIAGNOSIS — E213 Hyperparathyroidism, unspecified: Secondary | ICD-10-CM | POA: Diagnosis not present

## 2023-07-26 DIAGNOSIS — I10 Essential (primary) hypertension: Secondary | ICD-10-CM | POA: Diagnosis not present

## 2023-07-26 DIAGNOSIS — M4722 Other spondylosis with radiculopathy, cervical region: Secondary | ICD-10-CM | POA: Diagnosis not present

## 2023-07-27 DIAGNOSIS — N183 Chronic kidney disease, stage 3 unspecified: Secondary | ICD-10-CM | POA: Diagnosis not present

## 2023-07-27 DIAGNOSIS — E213 Hyperparathyroidism, unspecified: Secondary | ICD-10-CM | POA: Diagnosis not present

## 2023-07-27 DIAGNOSIS — M4722 Other spondylosis with radiculopathy, cervical region: Secondary | ICD-10-CM | POA: Diagnosis not present

## 2023-07-27 DIAGNOSIS — M25511 Pain in right shoulder: Secondary | ICD-10-CM | POA: Diagnosis not present

## 2023-07-27 DIAGNOSIS — E039 Hypothyroidism, unspecified: Secondary | ICD-10-CM | POA: Diagnosis not present

## 2023-07-27 DIAGNOSIS — E1122 Type 2 diabetes mellitus with diabetic chronic kidney disease: Secondary | ICD-10-CM | POA: Diagnosis not present

## 2023-07-27 DIAGNOSIS — I10 Essential (primary) hypertension: Secondary | ICD-10-CM | POA: Diagnosis not present

## 2023-07-27 DIAGNOSIS — M4802 Spinal stenosis, cervical region: Secondary | ICD-10-CM | POA: Diagnosis not present

## 2023-07-27 DIAGNOSIS — M532X1 Spinal instabilities, occipito-atlanto-axial region: Secondary | ICD-10-CM | POA: Diagnosis not present

## 2023-07-30 DIAGNOSIS — M7582 Other shoulder lesions, left shoulder: Secondary | ICD-10-CM | POA: Diagnosis not present

## 2023-07-30 DIAGNOSIS — M75101 Unspecified rotator cuff tear or rupture of right shoulder, not specified as traumatic: Secondary | ICD-10-CM | POA: Diagnosis not present

## 2023-07-30 DIAGNOSIS — G8929 Other chronic pain: Secondary | ICD-10-CM | POA: Diagnosis not present

## 2023-07-30 DIAGNOSIS — M12811 Other specific arthropathies, not elsewhere classified, right shoulder: Secondary | ICD-10-CM | POA: Diagnosis not present

## 2023-08-02 ENCOUNTER — Telehealth: Payer: Self-pay | Admitting: Orthopedic Surgery

## 2023-08-02 DIAGNOSIS — M25511 Pain in right shoulder: Secondary | ICD-10-CM | POA: Diagnosis not present

## 2023-08-02 DIAGNOSIS — E213 Hyperparathyroidism, unspecified: Secondary | ICD-10-CM | POA: Diagnosis not present

## 2023-08-02 DIAGNOSIS — I10 Essential (primary) hypertension: Secondary | ICD-10-CM | POA: Diagnosis not present

## 2023-08-02 DIAGNOSIS — E039 Hypothyroidism, unspecified: Secondary | ICD-10-CM | POA: Diagnosis not present

## 2023-08-02 DIAGNOSIS — N183 Chronic kidney disease, stage 3 unspecified: Secondary | ICD-10-CM | POA: Diagnosis not present

## 2023-08-02 DIAGNOSIS — M4802 Spinal stenosis, cervical region: Secondary | ICD-10-CM | POA: Diagnosis not present

## 2023-08-02 DIAGNOSIS — M532X1 Spinal instabilities, occipito-atlanto-axial region: Secondary | ICD-10-CM | POA: Diagnosis not present

## 2023-08-02 DIAGNOSIS — E1122 Type 2 diabetes mellitus with diabetic chronic kidney disease: Secondary | ICD-10-CM | POA: Diagnosis not present

## 2023-08-02 DIAGNOSIS — M4722 Other spondylosis with radiculopathy, cervical region: Secondary | ICD-10-CM | POA: Diagnosis not present

## 2023-08-02 NOTE — Telephone Encounter (Signed)
 Requesting Verbal Orders for physical therapy   1 w 3   Also wants to report patients pain was a 7/10 as of today.  Sherline Distel- [phone]

## 2023-08-02 NOTE — Telephone Encounter (Signed)
 I spoke to [facility] and gave verbal for PT for 1x3weeks

## 2023-08-08 ENCOUNTER — Ambulatory Visit: Admitting: Physician Assistant

## 2023-08-08 DIAGNOSIS — M25511 Pain in right shoulder: Secondary | ICD-10-CM | POA: Diagnosis not present

## 2023-08-08 DIAGNOSIS — M4802 Spinal stenosis, cervical region: Secondary | ICD-10-CM | POA: Diagnosis not present

## 2023-08-08 DIAGNOSIS — N183 Chronic kidney disease, stage 3 unspecified: Secondary | ICD-10-CM | POA: Diagnosis not present

## 2023-08-08 DIAGNOSIS — E213 Hyperparathyroidism, unspecified: Secondary | ICD-10-CM | POA: Diagnosis not present

## 2023-08-08 DIAGNOSIS — M4722 Other spondylosis with radiculopathy, cervical region: Secondary | ICD-10-CM | POA: Diagnosis not present

## 2023-08-08 DIAGNOSIS — M532X1 Spinal instabilities, occipito-atlanto-axial region: Secondary | ICD-10-CM | POA: Diagnosis not present

## 2023-08-08 DIAGNOSIS — E1122 Type 2 diabetes mellitus with diabetic chronic kidney disease: Secondary | ICD-10-CM | POA: Diagnosis not present

## 2023-08-08 DIAGNOSIS — I10 Essential (primary) hypertension: Secondary | ICD-10-CM | POA: Diagnosis not present

## 2023-08-08 DIAGNOSIS — E039 Hypothyroidism, unspecified: Secondary | ICD-10-CM | POA: Diagnosis not present

## 2023-08-09 DIAGNOSIS — K219 Gastro-esophageal reflux disease without esophagitis: Secondary | ICD-10-CM | POA: Diagnosis not present

## 2023-08-09 DIAGNOSIS — I10 Essential (primary) hypertension: Secondary | ICD-10-CM | POA: Diagnosis not present

## 2023-08-09 DIAGNOSIS — E669 Obesity, unspecified: Secondary | ICD-10-CM | POA: Diagnosis not present

## 2023-08-09 DIAGNOSIS — R600 Localized edema: Secondary | ICD-10-CM | POA: Diagnosis not present

## 2023-08-09 DIAGNOSIS — I890 Lymphedema, not elsewhere classified: Secondary | ICD-10-CM | POA: Diagnosis not present

## 2023-08-09 DIAGNOSIS — M21619 Bunion of unspecified foot: Secondary | ICD-10-CM | POA: Diagnosis not present

## 2023-08-09 DIAGNOSIS — R0602 Shortness of breath: Secondary | ICD-10-CM | POA: Diagnosis not present

## 2023-08-09 DIAGNOSIS — E119 Type 2 diabetes mellitus without complications: Secondary | ICD-10-CM | POA: Diagnosis not present

## 2023-08-09 DIAGNOSIS — N183 Chronic kidney disease, stage 3 unspecified: Secondary | ICD-10-CM | POA: Diagnosis not present

## 2023-08-15 DIAGNOSIS — M532X1 Spinal instabilities, occipito-atlanto-axial region: Secondary | ICD-10-CM | POA: Diagnosis not present

## 2023-08-15 DIAGNOSIS — E039 Hypothyroidism, unspecified: Secondary | ICD-10-CM | POA: Diagnosis not present

## 2023-08-15 DIAGNOSIS — I10 Essential (primary) hypertension: Secondary | ICD-10-CM | POA: Diagnosis not present

## 2023-08-15 DIAGNOSIS — M4802 Spinal stenosis, cervical region: Secondary | ICD-10-CM | POA: Diagnosis not present

## 2023-08-15 DIAGNOSIS — E213 Hyperparathyroidism, unspecified: Secondary | ICD-10-CM | POA: Diagnosis not present

## 2023-08-15 DIAGNOSIS — N183 Chronic kidney disease, stage 3 unspecified: Secondary | ICD-10-CM | POA: Diagnosis not present

## 2023-08-15 DIAGNOSIS — E1122 Type 2 diabetes mellitus with diabetic chronic kidney disease: Secondary | ICD-10-CM | POA: Diagnosis not present

## 2023-08-15 DIAGNOSIS — M25511 Pain in right shoulder: Secondary | ICD-10-CM | POA: Diagnosis not present

## 2023-08-15 DIAGNOSIS — M4722 Other spondylosis with radiculopathy, cervical region: Secondary | ICD-10-CM | POA: Diagnosis not present

## 2023-08-16 ENCOUNTER — Ambulatory Visit: Admitting: Physician Assistant

## 2023-08-17 ENCOUNTER — Telehealth: Payer: Self-pay | Admitting: Orthopedic Surgery

## 2023-08-17 NOTE — Telephone Encounter (Signed)
 Erika from [place]  Verbal order to move OT discharge to next week. Phone [phone]

## 2023-08-17 NOTE — Telephone Encounter (Signed)
 Spoke with Antony Baumgartner at [place] and gave the verbal order. She verbalized understanding.

## 2023-08-22 ENCOUNTER — Emergency Department

## 2023-08-22 ENCOUNTER — Emergency Department
Admission: EM | Admit: 2023-08-22 | Discharge: 2023-08-22 | Disposition: A | Discharge status: Home / Self Care | Attending: Emergency Medicine | Admitting: Emergency Medicine

## 2023-08-22 ENCOUNTER — Other Ambulatory Visit: Payer: Self-pay

## 2023-08-22 DIAGNOSIS — R1030 Lower abdominal pain, unspecified: Secondary | ICD-10-CM | POA: Insufficient documentation

## 2023-08-22 DIAGNOSIS — E1122 Type 2 diabetes mellitus with diabetic chronic kidney disease: Secondary | ICD-10-CM | POA: Diagnosis not present

## 2023-08-22 DIAGNOSIS — I129 Hypertensive chronic kidney disease with stage 1 through stage 4 chronic kidney disease, or unspecified chronic kidney disease: Secondary | ICD-10-CM | POA: Insufficient documentation

## 2023-08-22 DIAGNOSIS — N189 Chronic kidney disease, unspecified: Secondary | ICD-10-CM | POA: Diagnosis not present

## 2023-08-22 DIAGNOSIS — N939 Abnormal uterine and vaginal bleeding, unspecified: Secondary | ICD-10-CM | POA: Diagnosis not present

## 2023-08-22 LAB — CBC WITH DIFFERENTIAL/PLATELET
Abs Immature Granulocytes: 0.04 10*3/uL (ref 0.00–0.07)
Basophils Absolute: 0.1 10*3/uL (ref 0.0–0.1)
Basophils Relative: 1 %
Eosinophils Absolute: 0.1 10*3/uL (ref 0.0–0.5)
Eosinophils Relative: 2 %
HCT: 33 % — ABNORMAL LOW (ref 36.0–46.0)
Hemoglobin: 10.4 g/dL — ABNORMAL LOW (ref 12.0–15.0)
Immature Granulocytes: 1 %
Lymphocytes Relative: 20 %
Lymphs Abs: 1.7 10*3/uL (ref 0.7–4.0)
MCH: 27.2 pg (ref 26.0–34.0)
MCHC: 31.5 g/dL (ref 30.0–36.0)
MCV: 86.2 fL (ref 80.0–100.0)
Monocytes Absolute: 0.6 10*3/uL (ref 0.1–1.0)
Monocytes Relative: 8 %
Neutro Abs: 5.6 10*3/uL (ref 1.7–7.7)
Neutrophils Relative %: 68 %
Platelets: 226 10*3/uL (ref 150–400)
RBC: 3.83 MIL/uL — ABNORMAL LOW (ref 3.87–5.11)
RDW: 14.7 % (ref 11.5–15.5)
WBC: 8.1 10*3/uL (ref 4.0–10.5)
nRBC: 0 % (ref 0.0–0.2)

## 2023-08-22 LAB — BASIC METABOLIC PANEL WITH GFR
Anion gap: 5 (ref 5–15)
BUN: 40 mg/dL — ABNORMAL HIGH (ref 8–23)
CO2: 26 mmol/L (ref 22–32)
Calcium: 11.1 mg/dL — ABNORMAL HIGH (ref 8.9–10.3)
Chloride: 107 mmol/L (ref 98–111)
Creatinine, Ser: 1.45 mg/dL — ABNORMAL HIGH (ref 0.44–1.00)
GFR, Estimated: 35 mL/min — ABNORMAL LOW (ref >60)
Glucose, Bld: 128 mg/dL — ABNORMAL HIGH (ref 70–99)
Potassium: 4.8 mmol/L (ref 3.5–5.1)
Sodium: 138 mmol/L (ref 135–145)

## 2023-08-22 NOTE — ED Provider Notes (Signed)
 [facility] Provider Note    Event Date/Time   First MD Initiated Contact with Patient 08/22/23 1727     (approximate)   History   Chief Complaint: Vaginal Bleeding   HPI  Sherry [name] is a 89 y.o. female with a history of hypertension, diabetes, CKD who comes ED complaining of vaginal bleeding that started yesterday, passed a large clot yesterday and has some scant spotting throughout the day today with wiping.  No black or bloody bowel movements, no diarrhea, no vomiting, no fever.  Complains of some vague lower abdominal discomfort as well.  Denies any history of vaginal bleeding after menopause.        Past Medical History:  Diagnosis Date   Allergy    Diabetes mellitus without complication (HCC)    Renal insufficiency    Thyroid  disease     Current Outpatient Rx   Order #: [id number] Class: Historical Med   Order #: [id number] Class: Historical Med   Order #: [id number] Class: Historical Med   Order #: [id number] Class: Historical Med   Order #: [id number] Class: Historical Med   Order #: [id number] Class: Historical Med   Order #: [id number] Class: Historical Med   Order #: [id number] Class: No Print   Order #: [id number] Class: Normal   Order #: [id number] Class: Historical Med   Order #: [id number] Class: No Print   Order #: [id number] Class: Historical Med   Order #: [id number] Class: Historical Med   Order #: [id number] Class: Historical Med   Order #: [id number] Class: Historical Med    Past Surgical History:  Procedure Laterality Date   CHOLECYSTECTOMY      Physical Exam   Triage Vital Signs: ED Triage Vitals  Encounter Vitals Group     BP 08/22/23 1717 (!) 141/89     Systolic BP Percentile --      Diastolic BP Percentile --      Pulse Rate 08/22/23 1717 74     Resp 08/22/23 1717 18     Temp 08/22/23 1717 98 F (36.7 C)     Temp src --      SpO2 08/22/23 1717 95 %     Weight 08/22/23 1717 172 lb (78 kg)     Height --      Head  Circumference --      Peak Flow --      Pain Score 08/22/23 1716 3     Pain Loc --      Pain Education --      Exclude from Growth Chart --     Most recent vital signs: Vitals:   08/22/23 1717  BP: (!) 141/89  Pulse: 74  Resp: 18  Temp: 98 F (36.7 C)  SpO2: 95%    General: Awake, no distress.  CV:  Good peripheral perfusion.  Regular rate and rhythm Resp:  Normal effort.  Abd:  No distention.  Soft nontender Other:  With dry mucosa.  No rash   ED Results / Procedures / Treatments   Labs (all labs ordered are listed, but only abnormal results are displayed) Labs Reviewed  BASIC METABOLIC PANEL WITH GFR  CBC WITH DIFFERENTIAL/PLATELET     EKG    RADIOLOGY Ultrasound pelvis unremarkable   PROCEDURES:  Procedures   MEDICATIONS ORDERED IN ED: Medications - No data to display   IMPRESSION / MDM / ASSESSMENT AND PLAN / ED COURSE  I reviewed the triage vital signs and the nursing notes.  DDx: Vaginal irritation, thrombocytopenia, anemia, cervical cancer, uterine cancer  Patient's presentation  is most consistent with acute presentation with potential threat to life or bodily function.  Patient presents with vaginal bleeding in  89 year old postmenopausal female.  Serum labs and pelvic ultrasound unremarkable.  She is nontoxic, bleeding is not severe or brisk.  Vital signs and abdominal exam are reassuring.  Stable for outpatient follow-up with gynecology for further assessment.       FINAL CLINICAL IMPRESSION(S) / ED DIAGNOSES   Final diagnoses:  None     Rx / DC Orders   ED Discharge Orders     None        Note:  This document was prepared using Dragon voice recognition software and may include unintentional dictation errors.   Jacquie Maudlin, MD 08/22/23 2123

## 2023-08-22 NOTE — ED Triage Notes (Signed)
 Pt arrives with c/o vaginal bleeding that started yesterday. Per pt, she passed a large clot yesterday. Pt is having lower ABD pressure.

## 2023-08-24 DIAGNOSIS — E1122 Type 2 diabetes mellitus with diabetic chronic kidney disease: Secondary | ICD-10-CM | POA: Diagnosis not present

## 2023-08-24 DIAGNOSIS — E213 Hyperparathyroidism, unspecified: Secondary | ICD-10-CM | POA: Diagnosis not present

## 2023-08-24 DIAGNOSIS — I10 Essential (primary) hypertension: Secondary | ICD-10-CM | POA: Diagnosis not present

## 2023-08-24 DIAGNOSIS — M25511 Pain in right shoulder: Secondary | ICD-10-CM | POA: Diagnosis not present

## 2023-08-24 DIAGNOSIS — E039 Hypothyroidism, unspecified: Secondary | ICD-10-CM | POA: Diagnosis not present

## 2023-08-24 DIAGNOSIS — M532X1 Spinal instabilities, occipito-atlanto-axial region: Secondary | ICD-10-CM | POA: Diagnosis not present

## 2023-08-24 DIAGNOSIS — N183 Chronic kidney disease, stage 3 unspecified: Secondary | ICD-10-CM | POA: Diagnosis not present

## 2023-08-24 DIAGNOSIS — M4722 Other spondylosis with radiculopathy, cervical region: Secondary | ICD-10-CM | POA: Diagnosis not present

## 2023-08-24 DIAGNOSIS — M4802 Spinal stenosis, cervical region: Secondary | ICD-10-CM | POA: Diagnosis not present

## 2023-08-28 DIAGNOSIS — M532X1 Spinal instabilities, occipito-atlanto-axial region: Secondary | ICD-10-CM | POA: Diagnosis not present

## 2023-08-28 DIAGNOSIS — E213 Hyperparathyroidism, unspecified: Secondary | ICD-10-CM | POA: Diagnosis not present

## 2023-08-28 DIAGNOSIS — N183 Chronic kidney disease, stage 3 unspecified: Secondary | ICD-10-CM | POA: Diagnosis not present

## 2023-08-28 DIAGNOSIS — M25511 Pain in right shoulder: Secondary | ICD-10-CM | POA: Diagnosis not present

## 2023-08-28 DIAGNOSIS — M4722 Other spondylosis with radiculopathy, cervical region: Secondary | ICD-10-CM | POA: Diagnosis not present

## 2023-08-28 DIAGNOSIS — E1122 Type 2 diabetes mellitus with diabetic chronic kidney disease: Secondary | ICD-10-CM | POA: Diagnosis not present

## 2023-08-28 DIAGNOSIS — E039 Hypothyroidism, unspecified: Secondary | ICD-10-CM | POA: Diagnosis not present

## 2023-08-28 DIAGNOSIS — I10 Essential (primary) hypertension: Secondary | ICD-10-CM | POA: Diagnosis not present

## 2023-08-28 DIAGNOSIS — M4802 Spinal stenosis, cervical region: Secondary | ICD-10-CM | POA: Diagnosis not present

## 2023-08-30 ENCOUNTER — Encounter: Payer: Self-pay | Admitting: Physician Assistant

## 2023-09-03 DIAGNOSIS — N950 Postmenopausal bleeding: Secondary | ICD-10-CM | POA: Diagnosis not present

## 2023-09-03 DIAGNOSIS — N858 Other specified noninflammatory disorders of uterus: Secondary | ICD-10-CM | POA: Diagnosis not present

## 2023-09-03 DIAGNOSIS — Z124 Encounter for screening for malignant neoplasm of cervix: Secondary | ICD-10-CM | POA: Diagnosis not present

## 2023-09-05 DIAGNOSIS — G8929 Other chronic pain: Secondary | ICD-10-CM | POA: Diagnosis not present

## 2023-09-05 DIAGNOSIS — E1142 Type 2 diabetes mellitus with diabetic polyneuropathy: Secondary | ICD-10-CM | POA: Diagnosis not present

## 2023-09-05 DIAGNOSIS — M2141 Flat foot [pes planus] (acquired), right foot: Secondary | ICD-10-CM | POA: Diagnosis not present

## 2023-09-05 DIAGNOSIS — I890 Lymphedema, not elsewhere classified: Secondary | ICD-10-CM | POA: Diagnosis not present

## 2023-09-05 DIAGNOSIS — M25572 Pain in left ankle and joints of left foot: Secondary | ICD-10-CM | POA: Diagnosis not present

## 2023-09-05 DIAGNOSIS — M2142 Flat foot [pes planus] (acquired), left foot: Secondary | ICD-10-CM | POA: Diagnosis not present

## 2023-09-05 DIAGNOSIS — M545 Low back pain, unspecified: Secondary | ICD-10-CM | POA: Diagnosis not present

## 2023-09-05 DIAGNOSIS — M79672 Pain in left foot: Secondary | ICD-10-CM | POA: Diagnosis not present

## 2023-09-12 DIAGNOSIS — I890 Lymphedema, not elsewhere classified: Secondary | ICD-10-CM | POA: Diagnosis not present

## 2023-09-12 DIAGNOSIS — M2142 Flat foot [pes planus] (acquired), left foot: Secondary | ICD-10-CM | POA: Diagnosis not present

## 2023-09-12 DIAGNOSIS — M79672 Pain in left foot: Secondary | ICD-10-CM | POA: Diagnosis not present

## 2023-09-12 DIAGNOSIS — M76829 Posterior tibial tendinitis, unspecified leg: Secondary | ICD-10-CM | POA: Diagnosis not present

## 2023-09-12 DIAGNOSIS — M2141 Flat foot [pes planus] (acquired), right foot: Secondary | ICD-10-CM | POA: Diagnosis not present

## 2023-09-12 DIAGNOSIS — M545 Low back pain, unspecified: Secondary | ICD-10-CM | POA: Diagnosis not present

## 2023-09-12 DIAGNOSIS — M25572 Pain in left ankle and joints of left foot: Secondary | ICD-10-CM | POA: Diagnosis not present

## 2023-09-12 DIAGNOSIS — G8929 Other chronic pain: Secondary | ICD-10-CM | POA: Diagnosis not present

## 2023-09-12 DIAGNOSIS — E1142 Type 2 diabetes mellitus with diabetic polyneuropathy: Secondary | ICD-10-CM | POA: Diagnosis not present

## 2023-09-19 DIAGNOSIS — I890 Lymphedema, not elsewhere classified: Secondary | ICD-10-CM | POA: Diagnosis not present

## 2023-09-19 DIAGNOSIS — M76829 Posterior tibial tendinitis, unspecified leg: Secondary | ICD-10-CM | POA: Diagnosis not present

## 2023-09-19 DIAGNOSIS — M76822 Posterior tibial tendinitis, left leg: Secondary | ICD-10-CM | POA: Diagnosis not present

## 2023-09-19 DIAGNOSIS — E1142 Type 2 diabetes mellitus with diabetic polyneuropathy: Secondary | ICD-10-CM | POA: Diagnosis not present

## 2023-09-19 DIAGNOSIS — M2141 Flat foot [pes planus] (acquired), right foot: Secondary | ICD-10-CM | POA: Diagnosis not present

## 2023-09-19 DIAGNOSIS — M2142 Flat foot [pes planus] (acquired), left foot: Secondary | ICD-10-CM | POA: Diagnosis not present

## 2023-09-26 DIAGNOSIS — B351 Tinea unguium: Secondary | ICD-10-CM | POA: Diagnosis not present

## 2023-09-26 DIAGNOSIS — M2142 Flat foot [pes planus] (acquired), left foot: Secondary | ICD-10-CM | POA: Diagnosis not present

## 2023-09-26 DIAGNOSIS — E1142 Type 2 diabetes mellitus with diabetic polyneuropathy: Secondary | ICD-10-CM | POA: Diagnosis not present

## 2023-09-26 DIAGNOSIS — M2141 Flat foot [pes planus] (acquired), right foot: Secondary | ICD-10-CM | POA: Diagnosis not present

## 2023-09-26 DIAGNOSIS — E1122 Type 2 diabetes mellitus with diabetic chronic kidney disease: Secondary | ICD-10-CM | POA: Diagnosis not present

## 2023-09-26 DIAGNOSIS — N1832 Chronic kidney disease, stage 3b: Secondary | ICD-10-CM | POA: Diagnosis not present

## 2023-09-26 DIAGNOSIS — E7800 Pure hypercholesterolemia, unspecified: Secondary | ICD-10-CM | POA: Diagnosis not present

## 2023-09-26 DIAGNOSIS — N183 Chronic kidney disease, stage 3 unspecified: Secondary | ICD-10-CM | POA: Diagnosis not present

## 2023-09-26 DIAGNOSIS — M76829 Posterior tibial tendinitis, unspecified leg: Secondary | ICD-10-CM | POA: Diagnosis not present

## 2023-09-26 DIAGNOSIS — M76822 Posterior tibial tendinitis, left leg: Secondary | ICD-10-CM | POA: Diagnosis not present

## 2023-09-26 DIAGNOSIS — D631 Anemia in chronic kidney disease: Secondary | ICD-10-CM | POA: Diagnosis not present

## 2023-09-26 DIAGNOSIS — I890 Lymphedema, not elsewhere classified: Secondary | ICD-10-CM | POA: Diagnosis not present

## 2023-09-26 DIAGNOSIS — I129 Hypertensive chronic kidney disease with stage 1 through stage 4 chronic kidney disease, or unspecified chronic kidney disease: Secondary | ICD-10-CM | POA: Diagnosis not present

## 2023-09-26 DIAGNOSIS — Z87891 Personal history of nicotine dependence: Secondary | ICD-10-CM | POA: Diagnosis not present

## 2023-09-26 DIAGNOSIS — M25572 Pain in left ankle and joints of left foot: Secondary | ICD-10-CM | POA: Diagnosis not present

## 2023-09-26 DIAGNOSIS — E039 Hypothyroidism, unspecified: Secondary | ICD-10-CM | POA: Diagnosis not present

## 2023-09-26 DIAGNOSIS — G8929 Other chronic pain: Secondary | ICD-10-CM | POA: Diagnosis not present

## 2023-10-01 ENCOUNTER — Encounter: Payer: Self-pay | Admitting: Physician Assistant

## 2023-10-01 ENCOUNTER — Ambulatory Visit: Admitting: Physician Assistant

## 2023-10-01 VITALS — BP 132/62 | Ht 65.0 in | Wt 172.0 lb

## 2023-10-01 DIAGNOSIS — M47812 Spondylosis without myelopathy or radiculopathy, cervical region: Secondary | ICD-10-CM

## 2023-10-01 DIAGNOSIS — M4802 Spinal stenosis, cervical region: Secondary | ICD-10-CM

## 2023-10-01 DIAGNOSIS — M532X1 Spinal instabilities, occipito-atlanto-axial region: Secondary | ICD-10-CM | POA: Diagnosis not present
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
## 2023-10-04 ENCOUNTER — Encounter (INDEPENDENT_AMBULATORY_CARE_PROVIDER_SITE_OTHER): Payer: Self-pay | Admitting: Nurse Practitioner

## 2023-10-04 ENCOUNTER — Ambulatory Visit (INDEPENDENT_AMBULATORY_CARE_PROVIDER_SITE_OTHER): Admitting: Nurse Practitioner

## 2023-10-04 VITALS — BP 124/73 | HR 62 | Resp 16

## 2023-10-04 DIAGNOSIS — I890 Lymphedema, not elsewhere classified: Secondary | ICD-10-CM | POA: Diagnosis not present

## 2023-10-04 DIAGNOSIS — M48062 Spinal stenosis, lumbar region with neurogenic claudication: Secondary | ICD-10-CM

## 2023-10-04 DIAGNOSIS — N183 Chronic kidney disease, stage 3 unspecified: Secondary | ICD-10-CM

## 2023-10-04 DIAGNOSIS — E1122 Type 2 diabetes mellitus with diabetic chronic kidney disease: Secondary | ICD-10-CM | POA: Diagnosis not present

## 2023-10-04 DIAGNOSIS — I10 Essential (primary) hypertension: Secondary | ICD-10-CM | POA: Diagnosis not present

## 2023-10-08 NOTE — Progress Notes (Signed)
 Subjective:    Patient ID: Sherry [name], female    DOB: [birth year], 89 y.o.   MRN: [id number] Chief Complaint  Patient presents with   Establish Care    Last seen 12/2021 consult Lymphedema Ref. Baker    The patient returns to the office for followup evaluation regarding leg swelling.  The swelling has persisted and the pain associated with swelling continues. There have not been any interval development of a ulcerations or wounds.  The patient returns today for noninvasive studies for further evaluation of the leg swelling.  She also endorses having some balance issues and has known back issues.  Since the previous visit the patient has been wearing graduated compression stockings and has noted little if any improvement in the lymphedema. The patient has been using compression routinely morning until night.  The patient also states elevation during the day and exercise is being done too.    Noninvasive studies show no evidence of DVT or superficial thrombophlebitis bilaterally.  No evidence of deep venous insufficiency.  There is evidence of reflux in the left small saphenous vein.  No evidence of reflux in the left lower extremity.     Review of Systems  Cardiovascular:  Positive for leg swelling.  Musculoskeletal:  Positive for back pain.  Neurological:  Positive for weakness.  All other systems reviewed and are negative.      Objective:   Physical Exam Vitals reviewed.  HENT:     Head: Normocephalic.  Cardiovascular:     Rate and Rhythm: Normal rate.  Pulmonary:     Effort: Pulmonary effort is normal.  Musculoskeletal:     Right lower leg: 2+ Edema present.     Left lower leg: 2+ Edema present.  Skin:    General: Skin is warm and dry.     Comments: Dermal thickening bilaterally with stasis dermatitis  Hyperpigmentation present   Neurological:     Mental Status: She is alert and oriented to person, place, and time.     Motor: Weakness present.     Gait: Gait  abnormal.  Psychiatric:        Mood and Affect: Mood normal.        Behavior: Behavior normal.        Thought Content: Thought content normal.        Judgment: Judgment normal.     BP 124/73 (BP Location: Left Arm, Patient Position: Sitting, Cuff Size: Normal)   Pulse 62   Resp 16   Past Medical History:  Diagnosis Date   Allergy    Diabetes mellitus without complication (HCC)    Renal insufficiency    Thyroid  disease     Social History   Socioeconomic History   Marital status: Widowed    Spouse name: Not on file   Number of children: 2   Years of education: 12   Highest education level: High school graduate  Occupational History   Occupation: Retired  Tobacco Use   Smoking status: Former    Current packs/day: 0.00    Types: Cigarettes    Quit date: 2004    Years since quitting: 21.5   Smokeless tobacco: Never  Vaping Use   Vaping status: Never Used  Substance and Sexual Activity   Alcohol use: Not Currently   Drug use: Not Currently   Sexual activity: Never  Other Topics Concern   Not on file  Social History Narrative   Not on file   Social Drivers of Health  Financial Resource Strain: Low Risk  (09/05/2023)   Received from [facility]   Overall Financial Resource Strain (CARDIA)    Difficulty of Paying Living Expenses: Not hard at all  Food Insecurity: No Food Insecurity (09/05/2023)   Received from [facility]   Hunger Vital Sign    Within the past 12 months, you worried that your food would run out before you got the money to buy more.: Never true    Within the past 12 months, the food you bought just didn't last and you didn't have money to get more.: Never true  Transportation Needs: No Transportation Needs (09/05/2023)   Received from [facility] - Transportation    In the past 12 months, has lack of transportation kept you from medical appointments or from getting medications?: No     Lack of Transportation (Non-Medical): No  Physical Activity: Inactive (06/04/2019)   Exercise Vital Sign    Days of Exercise per Week: 0 days    Minutes of Exercise per Session: 0 min  Stress: No Stress Concern Present (06/04/2019)   [employer] of Occupational Health - Occupational Stress Questionnaire    Feeling of Stress : Only a little  Social Connections: Moderately Integrated (06/04/2019)   Social Connection and Isolation Panel    Frequency of Communication with Friends and Family: More than three times a week    Frequency of Social Gatherings with Friends and Family: More than three times a week    Attends Religious Services: More than 4 times per year    Active Member of [employer] or Organizations: Yes    Attends Banker Meetings: More than 4 times per year    Marital Status: Widowed  Intimate Partner Violence: Not At Risk (06/04/2019)   Humiliation, Afraid, Rape, and Kick questionnaire    Fear of Current or Ex-Partner: No    Emotionally Abused: No    Physically Abused: No    Sexually Abused: No    Past Surgical History:  Procedure Laterality Date   CHOLECYSTECTOMY      Family History  Problem Relation Age of Onset   Diabetes Mother    Hypertension Mother    Hypertension Father    Cancer Brother     Allergies  Allergen Reactions   Penicillins Swelling and Rash    Has patient had a PCN reaction causing immediate rash, facial/tongue/throat swelling, SOB or lightheadedness with hypotension: No Has patient had a PCN reaction causing severe rash involving mucus membranes or skin necrosis: No Has patient had a PCN reaction that required hospitalization: No Has patient had a PCN reaction occurring within the last 10 years: No If all of the above answers are NO, then may proceed with Cephalosporin use.       Latest Ref Rng & Units 08/22/2023    6:08 PM 05/01/2023    1:54 AM 07/11/2022    6:04 PM  CBC  WBC 4.0 - 10.5 K/uL 8.1  8.8  7.1   Hemoglobin 12.0 -  15.0 g/dL 89.5  88.1  89.4   Hematocrit 36.0 - 46.0 % 33.0  37.8  33.8   Platelets 150 - 400 K/uL 226  277  260       CMP     Component Value Date/Time   NA 138 08/22/2023 1808   NA 139 06/19/2014 1924   K 4.8 08/22/2023 1808   K 4.2 06/19/2014 1924   CL 107 08/22/2023 1808  CL 103 06/19/2014 1924   CO2 26 08/22/2023 1808   CO2 29 06/19/2014 1924   GLUCOSE 128 (H) 08/22/2023 1808   GLUCOSE 156 (H) 06/19/2014 1924   BUN 40 (H) 08/22/2023 1808   BUN 33 (H) 06/19/2014 1924   CREATININE 1.45 (H) 08/22/2023 1808   CREATININE 1.37 (H) 06/14/2022 1024   CREATININE 1.65 (H) 06/19/2014 1924   CALCIUM 11.1 (H) 08/22/2023 1808   CALCIUM 11.0 (H) 06/19/2014 1924   PROT 6.8 03/27/2017 1857   PROT 5.8 (L) 08/12/2013 0451   ALBUMIN 3.5 03/27/2017 1857   ALBUMIN 2.8 (L) 08/12/2013 0451   AST 16 03/27/2017 1857   AST 24 08/12/2013 0451   ALT 12 (L) 03/27/2017 1857   ALT 57 08/12/2013 0451   ALKPHOS 53 03/27/2017 1857   ALKPHOS 78 08/12/2013 0451   BILITOT 1.3 (H) 03/27/2017 1857   BILITOT 0.3 08/12/2013 0451   GFRNONAA 35 (L) 08/22/2023 1808   GFRNONAA 38 (L) 06/14/2022 1024   GFRNONAA 29 (L) 06/19/2014 1924   GFRAA 42 (L) 11/13/2019 1446   GFRAA 34 (L) 06/19/2014 1924     No results found.     Assessment & Plan:   1. Lymphedema Recommend:  No surgery or intervention at this point in time.   The Patient is CEAP [id number].  The patient has been wearing compression for more than 12 weeks with no or little benefit.  The patient has been exercising daily for more than 12 weeks. The patient has been elevating and taking OTC pain medications for more than 12 weeks.  None of these have have eliminated the pain related to the lymphedema or the discomfort regarding excessive swelling and venous congestion.    I have reviewed my discussion with the patient regarding lymphedema and why it  causes symptoms.  Patient will continue wearing graduated compression on a daily basis. The  patient should put the compression on first thing in the morning and removing them in the evening. The patient should not sleep in the compression.   In addition, behavioral modification throughout the day will be continued.  This will include frequent elevation (such as in a recliner), use of over the counter pain medications as needed and exercise such as walking.  The systemic causes for chronic edema such as liver, kidney and cardiac etiologies do not appear to have significant changed over the past year.    The patient has chronic , severe lymphedema with hyperpigmentation of the skin and has done MLD, skin care, medication, diet, exercise, elevation and compression for 4 weeks with no improvement,  I am recommending a lymphedema pump.  The patient still has stage 3 lymphedema and therefore, I believe that a lymph pump is needed to improve the control of the patient's lymphedema and improve the quality of life.  Additionally, a lymph pump is warranted because it will reduce the risk of cellulitis and ulceration in the future.  Patient should follow-up in six months    2. Lumbar stenosis with neurogenic claudication This likely plays a factor in the patient's balance and lower extremity pain issues.  3. Diabetes mellitus with stage 3 chronic kidney disease (HCC) Continue hypoglycemic medications as already ordered, these medications have been reviewed and there are no changes at this time.  Hgb A1C to be monitored as already arranged by primary service   4. HTN (hypertension), benign Continue antihypertensive medications as already ordered, these medications have been reviewed and there are no changes at this time.  Current Outpatient Medications on File Prior to Visit  Medication Sig Dispense Refill   ACCU-CHEK AVIVA PLUS test strip      albuterol (VENTOLIN HFA) 108 (90 Base) MCG/ACT inhaler Inhale 2 puffs into the lungs every 6 (six) hours as needed.     atenolol (TENORMIN) 25 MG  tablet Take 25 mg by mouth daily.     Blood Glucose Calibration (ACCU-CHEK AVIVA) SOLN      Blood Glucose Monitoring Suppl (ACCU-CHEK AVIVA PLUS) w/Device KIT See admin instructions.     diclofenac  Sodium (VOLTAREN ) 1 % GEL Apply 2 g topically as needed.     doxycycline (VIBRAMYCIN) 100 MG capsule Take 100 mg by mouth 2 (two) times daily.     estradiol (ESTRACE) 0.5 MG tablet Take 0.5 mg by mouth daily.      guaiFENesin-codeine 100-10 MG/5ML syrup Take 2.5 mLs by mouth every 6 (six) hours as needed for cough.     levothyroxine  (SYNTHROID , LEVOTHROID) 25 MCG tablet TAKE 25 mcg TABLET EVERY DAY ON AN EMPTY STOMACH WITH A GLASS OF WATER AT LEAST 30 TO 60 MINUTES BEFORE BREAKFAST (Patient taking differently: Take 25 mcg by mouth daily before breakfast.)     lidocaine  (LIDODERM ) 5 % Place 1 patch onto the skin every 12 (twelve) hours. Remove & Discard patch within 12 hours or as directed by MD 60 patch 11   losartan  (COZAAR ) 50 MG tablet Take 50 mg by mouth daily.     metFORMIN  (GLUCOPHAGE ) 500 MG tablet TAKE 500 mg TABLET TWICE DAILY WITH MEALS  Do not take on 1/10 or 03/30/17 due to contrast given for CT scan. Restart taking on 03/31/17. (Patient taking differently: Take 500 mg by mouth 2 (two) times daily with a meal.)     montelukast  (SINGULAIR ) 10 MG tablet Take 10 mg by mouth daily.     neomycin-polymyxin b-dexamethasone  (MAXITROL) 3.5-10000-0.1 SUSP Apply 1 drop to eye.     pantoprazole (PROTONIX) 40 MG tablet Take 40 mg by mouth.     predniSONE (DELTASONE) 20 MG tablet Take 20 mg by mouth daily with breakfast.     progesterone  (PROMETRIUM ) 200 MG capsule Take 200 mg by mouth at bedtime.      torsemide (DEMADEX) 20 MG tablet Take 20 mg by mouth daily.     omeprazole (PRILOSEC) 40 MG capsule Take 40 mg by mouth daily. (Patient not taking: Reported on 10/01/2023)     No current facility-administered medications on file prior to visit.    There are no Patient Instructions on file for this  visit. No follow-ups on file.   Kirsten Spearing E Magally Vahle, NP

## 2023-12-10 ENCOUNTER — Other Ambulatory Visit

## 2023-12-10 ENCOUNTER — Emergency Department

## 2023-12-10 ENCOUNTER — Observation Stay
Admission: EM | Admit: 2023-12-10 | Discharge: 2023-12-18 | Disposition: A | Discharge status: Skilled Nursing Facility | Attending: Student | Admitting: Student

## 2023-12-10 ENCOUNTER — Observation Stay

## 2023-12-10 ENCOUNTER — Other Ambulatory Visit: Payer: Self-pay

## 2023-12-10 DIAGNOSIS — Z87891 Personal history of nicotine dependence: Secondary | ICD-10-CM | POA: Diagnosis not present

## 2023-12-10 DIAGNOSIS — N183 Chronic kidney disease, stage 3 unspecified: Secondary | ICD-10-CM

## 2023-12-10 DIAGNOSIS — R911 Solitary pulmonary nodule: Secondary | ICD-10-CM | POA: Diagnosis not present

## 2023-12-10 DIAGNOSIS — Z7984 Long term (current) use of oral hypoglycemic drugs: Secondary | ICD-10-CM | POA: Insufficient documentation

## 2023-12-10 DIAGNOSIS — M79604 Pain in right leg: Secondary | ICD-10-CM | POA: Diagnosis not present

## 2023-12-10 DIAGNOSIS — R590 Localized enlarged lymph nodes: Secondary | ICD-10-CM | POA: Insufficient documentation

## 2023-12-10 DIAGNOSIS — I10 Essential (primary) hypertension: Secondary | ICD-10-CM | POA: Diagnosis not present

## 2023-12-10 DIAGNOSIS — N1832 Chronic kidney disease, stage 3b: Secondary | ICD-10-CM | POA: Insufficient documentation

## 2023-12-10 DIAGNOSIS — M5021 Other cervical disc displacement,  high cervical region: Secondary | ICD-10-CM | POA: Diagnosis not present

## 2023-12-10 DIAGNOSIS — M542 Cervicalgia: Secondary | ICD-10-CM | POA: Diagnosis not present

## 2023-12-10 DIAGNOSIS — E1122 Type 2 diabetes mellitus with diabetic chronic kidney disease: Secondary | ICD-10-CM | POA: Diagnosis not present

## 2023-12-10 DIAGNOSIS — R1310 Dysphagia, unspecified: Secondary | ICD-10-CM | POA: Diagnosis not present

## 2023-12-10 DIAGNOSIS — R059 Cough, unspecified: Secondary | ICD-10-CM | POA: Diagnosis not present

## 2023-12-10 DIAGNOSIS — E538 Deficiency of other specified B group vitamins: Secondary | ICD-10-CM | POA: Diagnosis not present

## 2023-12-10 DIAGNOSIS — R634 Abnormal weight loss: Secondary | ICD-10-CM | POA: Diagnosis not present

## 2023-12-10 DIAGNOSIS — M47811 Spondylosis without myelopathy or radiculopathy, occipito-atlanto-axial region: Secondary | ICD-10-CM | POA: Diagnosis not present

## 2023-12-10 DIAGNOSIS — R531 Weakness: Secondary | ICD-10-CM | POA: Diagnosis not present

## 2023-12-10 DIAGNOSIS — K5730 Diverticulosis of large intestine without perforation or abscess without bleeding: Secondary | ICD-10-CM | POA: Diagnosis not present

## 2023-12-10 DIAGNOSIS — R600 Localized edema: Secondary | ICD-10-CM | POA: Diagnosis not present

## 2023-12-10 DIAGNOSIS — R262 Difficulty in walking, not elsewhere classified: Secondary | ICD-10-CM | POA: Diagnosis not present

## 2023-12-10 DIAGNOSIS — I700 Atherosclerosis of aorta: Secondary | ICD-10-CM | POA: Diagnosis not present

## 2023-12-10 DIAGNOSIS — J189 Pneumonia, unspecified organism: Secondary | ICD-10-CM | POA: Diagnosis not present

## 2023-12-10 DIAGNOSIS — I129 Hypertensive chronic kidney disease with stage 1 through stage 4 chronic kidney disease, or unspecified chronic kidney disease: Secondary | ICD-10-CM | POA: Insufficient documentation

## 2023-12-10 DIAGNOSIS — R918 Other nonspecific abnormal finding of lung field: Secondary | ICD-10-CM | POA: Diagnosis not present

## 2023-12-10 DIAGNOSIS — W19XXXA Unspecified fall, initial encounter: Secondary | ICD-10-CM | POA: Diagnosis not present

## 2023-12-10 DIAGNOSIS — N132 Hydronephrosis with renal and ureteral calculous obstruction: Secondary | ICD-10-CM | POA: Diagnosis not present

## 2023-12-10 DIAGNOSIS — M79605 Pain in left leg: Secondary | ICD-10-CM | POA: Diagnosis not present

## 2023-12-10 DIAGNOSIS — M47812 Spondylosis without myelopathy or radiculopathy, cervical region: Secondary | ICD-10-CM | POA: Diagnosis not present

## 2023-12-10 DIAGNOSIS — J168 Pneumonia due to other specified infectious organisms: Secondary | ICD-10-CM | POA: Diagnosis not present

## 2023-12-10 DIAGNOSIS — I6782 Cerebral ischemia: Secondary | ICD-10-CM | POA: Diagnosis not present

## 2023-12-10 LAB — URINALYSIS, ROUTINE W REFLEX MICROSCOPIC
Bilirubin Urine: NEGATIVE
Glucose, UA: NEGATIVE mg/dL
Hgb urine dipstick: NEGATIVE
Ketones, ur: NEGATIVE mg/dL
Leukocytes,Ua: NEGATIVE
Nitrite: NEGATIVE
Protein, ur: 30 mg/dL — AB
Specific Gravity, Urine: 1.016 (ref 1.005–1.030)
pH: 6 (ref 5.0–8.0)

## 2023-12-10 LAB — CBC WITH DIFFERENTIAL/PLATELET
Abs Immature Granulocytes: 0.05 K/uL (ref 0.00–0.07)
Basophils Absolute: 0.1 K/uL (ref 0.0–0.1)
Basophils Relative: 1 %
Eosinophils Absolute: 0.1 K/uL (ref 0.0–0.5)
Eosinophils Relative: 2 %
HCT: 32.5 % — ABNORMAL LOW (ref 36.0–46.0)
Hemoglobin: 10 g/dL — ABNORMAL LOW (ref 12.0–15.0)
Immature Granulocytes: 1 %
Lymphocytes Relative: 17 %
Lymphs Abs: 1.4 K/uL (ref 0.7–4.0)
MCH: 27.2 pg (ref 26.0–34.0)
MCHC: 30.8 g/dL (ref 30.0–36.0)
MCV: 88.6 fL (ref 80.0–100.0)
Monocytes Absolute: 0.7 K/uL (ref 0.1–1.0)
Monocytes Relative: 8 %
Neutro Abs: 6.3 K/uL (ref 1.7–7.7)
Neutrophils Relative %: 71 %
Platelets: 289 K/uL (ref 150–400)
RBC: 3.67 MIL/uL — ABNORMAL LOW (ref 3.87–5.11)
RDW: 13.5 % (ref 11.5–15.5)
WBC: 8.7 K/uL (ref 4.0–10.5)
nRBC: 0 % (ref 0.0–0.2)

## 2023-12-10 LAB — COMPREHENSIVE METABOLIC PANEL WITH GFR
ALT: 10 U/L (ref 0–44)
AST: 17 U/L (ref 15–41)
Albumin: 3.6 g/dL (ref 3.5–5.0)
Alkaline Phosphatase: 48 U/L (ref 38–126)
Anion gap: 8 (ref 5–15)
BUN: 22 mg/dL (ref 8–23)
CO2: 25 mmol/L (ref 22–32)
Calcium: 11.4 mg/dL — ABNORMAL HIGH (ref 8.9–10.3)
Chloride: 105 mmol/L (ref 98–111)
Creatinine, Ser: 1.21 mg/dL — ABNORMAL HIGH (ref 0.44–1.00)
GFR, Estimated: 43 mL/min — ABNORMAL LOW (ref >60)
Glucose, Bld: 134 mg/dL — ABNORMAL HIGH (ref 70–99)
Potassium: 4.1 mmol/L (ref 3.5–5.1)
Sodium: 138 mmol/L (ref 135–145)
Total Bilirubin: 1 mg/dL (ref 0.0–1.2)
Total Protein: 7.3 g/dL (ref 6.5–8.1)

## 2023-12-10 LAB — TROPONIN I (HIGH SENSITIVITY): Troponin I (High Sensitivity): 17 ng/L (ref <18)

## 2023-12-10 LAB — HEMOGLOBIN A1C
Hgb A1c MFr Bld: 6.2 % — ABNORMAL HIGH (ref 4.8–5.6)
Mean Plasma Glucose: 131.24 mg/dL

## 2023-12-10 LAB — STREP PNEUMONIAE URINARY ANTIGEN: Strep Pneumo Urinary Antigen: NEGATIVE

## 2023-12-10 LAB — CBG MONITORING, ED: Glucose-Capillary: 155 mg/dL — ABNORMAL HIGH (ref 70–99)

## 2023-12-10 LAB — GLUCOSE, CAPILLARY: Glucose-Capillary: 124 mg/dL — ABNORMAL HIGH (ref 70–99)

## 2023-12-10 LAB — CK: Total CK: 167 U/L (ref 38–234)

## 2023-12-10 LAB — PROCALCITONIN: Procalcitonin: <0.1 ng/mL

## 2023-12-10 MED ORDER — ACETAMINOPHEN 325 MG PO TABS: 650.0000 mg | ORAL_TABLET | Freq: Four times a day (QID) | ORAL | Status: DC | PRN

## 2023-12-10 MED ORDER — PNEUMOCOCCAL 20-VAL CONJ VACC 0.5 ML IM SUSY: 0.5000 mL | PREFILLED_SYRINGE | INTRAMUSCULAR | Status: DC

## 2023-12-10 MED ORDER — ATENOLOL 25 MG PO TABS
25.0000 mg | ORAL_TABLET | Freq: Every day | ORAL | Status: DC
  Administered 2023-12-10 – 2023-12-18 (×9): 25 mg via ORAL
  Filled 2023-12-10 (×9): qty 1

## 2023-12-10 MED ORDER — ENOXAPARIN SODIUM 40 MG/0.4ML IJ SOSY
40.0000 mg | PREFILLED_SYRINGE | INTRAMUSCULAR | Status: DC
  Administered 2023-12-10 – 2023-12-12 (×3): 40 mg via SUBCUTANEOUS
  Filled 2023-12-10 (×3): qty 0.4

## 2023-12-10 MED ORDER — PANTOPRAZOLE SODIUM 40 MG PO TBEC
40.0000 mg | DELAYED_RELEASE_TABLET | Freq: Every day | ORAL | Status: DC
  Administered 2023-12-10 – 2023-12-18 (×9): 40 mg via ORAL
  Filled 2023-12-10 (×9): qty 1

## 2023-12-10 MED ORDER — AZITHROMYCIN 500 MG PO TABS
500.0000 mg | ORAL_TABLET | Freq: Every day | ORAL | Status: DC
  Administered 2023-12-11 – 2023-12-17 (×7): 500 mg via ORAL
  Filled 2023-12-10 (×7): qty 1

## 2023-12-10 MED ORDER — SODIUM CHLORIDE 0.9 % IV SOLN
2.0000 g | Freq: Once | INTRAVENOUS | Status: AC
  Administered 2023-12-10: 2 g via INTRAVENOUS
  Filled 2023-12-10: qty 20

## 2023-12-10 MED ORDER — DOXYCYCLINE HYCLATE 100 MG PO TABS
100.0000 mg | ORAL_TABLET | Freq: Once | ORAL | Status: AC
  Administered 2023-12-10: 100 mg via ORAL
  Filled 2023-12-10: qty 1

## 2023-12-10 MED ORDER — ALBUTEROL SULFATE (2.5 MG/3ML) 0.083% IN NEBU: 2.5000 mg | INHALATION_SOLUTION | Freq: Four times a day (QID) | RESPIRATORY_TRACT | Status: DC | PRN

## 2023-12-10 MED ORDER — ENSURE PLUS HIGH PROTEIN PO LIQD
237.0000 mL | Freq: Two times a day (BID) | ORAL | Status: DC
  Administered 2023-12-11 – 2023-12-18 (×9): 237 mL via ORAL

## 2023-12-10 MED ORDER — INFLUENZA VAC SPLIT HIGH-DOSE 0.5 ML IM SUSY
0.5000 mL | PREFILLED_SYRINGE | INTRAMUSCULAR | Status: DC
  Filled 2023-12-10: qty 0.5

## 2023-12-10 MED ORDER — INSULIN ASPART 100 UNIT/ML IJ SOLN
0.0000 [IU] | Freq: Three times a day (TID) | INTRAMUSCULAR | Status: DC
  Administered 2023-12-11: 3 [IU] via SUBCUTANEOUS
  Administered 2023-12-11 – 2023-12-12 (×2): 1 [IU] via SUBCUTANEOUS
  Administered 2023-12-12 – 2023-12-13 (×4): 2 [IU] via SUBCUTANEOUS
  Administered 2023-12-13 – 2023-12-14 (×2): 1 [IU] via SUBCUTANEOUS
  Administered 2023-12-15: 2 [IU] via SUBCUTANEOUS
  Administered 2023-12-16 (×2): 1 [IU] via SUBCUTANEOUS
  Administered 2023-12-17 – 2023-12-18 (×3): 2 [IU] via SUBCUTANEOUS
  Filled 2023-12-10 (×15): qty 1

## 2023-12-10 MED ORDER — ONDANSETRON HCL 4 MG/2ML IJ SOLN: 4.0000 mg | Freq: Four times a day (QID) | INTRAMUSCULAR | Status: DC | PRN

## 2023-12-10 MED ORDER — GUAIFENESIN ER 600 MG PO TB12
600.0000 mg | ORAL_TABLET | Freq: Two times a day (BID) | ORAL | Status: DC
  Administered 2023-12-10 – 2023-12-18 (×16): 600 mg via ORAL
  Filled 2023-12-10 (×16): qty 1

## 2023-12-10 MED ORDER — ONDANSETRON HCL 4 MG PO TABS: 4.0000 mg | ORAL_TABLET | Freq: Four times a day (QID) | ORAL | Status: DC | PRN

## 2023-12-10 MED ORDER — LEVOTHYROXINE SODIUM 25 MCG PO TABS
25.0000 ug | ORAL_TABLET | Freq: Every day | ORAL | Status: DC
  Administered 2023-12-11 – 2023-12-18 (×8): 25 ug via ORAL
  Filled 2023-12-10 (×8): qty 1

## 2023-12-10 MED ORDER — GUAIFENESIN-CODEINE 100-10 MG/5ML PO SOLN: 2.5000 mL | Freq: Four times a day (QID) | ORAL | Status: DC | PRN

## 2023-12-10 MED ORDER — SODIUM CHLORIDE 0.9 % IV SOLN
2.0000 g | INTRAVENOUS | Status: AC
  Administered 2023-12-11 – 2023-12-14 (×4): 2 g via INTRAVENOUS
  Filled 2023-12-10 (×4): qty 20

## 2023-12-10 MED ORDER — ACETAMINOPHEN 650 MG RE SUPP: 650.0000 mg | Freq: Four times a day (QID) | RECTAL | Status: DC | PRN

## 2023-12-10 MED ORDER — MONTELUKAST SODIUM 10 MG PO TABS: 10.0000 mg | ORAL_TABLET | Freq: Every day | ORAL | Status: DC

## 2023-12-10 MED ORDER — LOSARTAN POTASSIUM 50 MG PO TABS
50.0000 mg | ORAL_TABLET | Freq: Every day | ORAL | Status: DC
  Administered 2023-12-11 – 2023-12-18 (×8): 50 mg via ORAL
  Filled 2023-12-10 (×8): qty 1

## 2023-12-10 MED ORDER — SODIUM CHLORIDE 0.9 % IV SOLN: INTRAVENOUS | Status: AC

## 2023-12-10 MED ORDER — BENZONATATE 100 MG PO CAPS: 100.0000 mg | ORAL_CAPSULE | Freq: Three times a day (TID) | ORAL | Status: DC | PRN

## 2023-12-10 NOTE — Discharge Instructions (Addendum)
 No evidence of an acute intracranial abnormality. 2. Parenchymal atrophy and chronic small vessel ischemic disease.   CT cervical spine:   1. No evidence of an acute cervical spine fracture. 2. 3 mm C2-C3 grade 1 anterolisthesis. 3. Nonspecific reversal of the expected cervical lordosis. 4. Levocurvature of the cervical spine. 5. Cervical spondylosis as described.  1. No acute traumatic injury in the abdomen or pelvis. 2. Very large sigmoid diverticulum demonstrates marked mural thickening, which may represent sequela of chronic diverticulitis. Recommend correlation with any history of recent colonoscopy. 3. Nonobstructing right lower pole renal stones measuring up to 4 mm. 4. Aortic Atherosclerosis (ICD10-I70.0). Coronary artery calcifications. Assessment for potential risk factor modification, dietary therapy or pharmacologic therapy may be warranted, if clinically indicated.

## 2023-12-10 NOTE — H&P (Addendum)
 History and Physical    Sherry [name] FMW:[id number] DOB: [birth year] DOA: 12/10/2023  PCP: Rudolpho Norleen BIRCH, MD (Confirm with patient/family/NH records and if not entered, this has to be entered at [facility] point of entry) Patient coming from: Home  I have personally briefly reviewed patient's old medical records in [facility]  Chief Complaint: Cough, SOB, weak and fall  HPI: Sherry [name] is a 89 y.o. female with medical history significant of HTN, IIDM, HLD, hypothyroidism, CKD stage IIIb, presented with worsening of productive cough, weakness and fall.  Symptoms started about 3 weeks ago, patient started to develop productive cough with whitish phlegm, episode chills but no fever.  No chest pains.  She has been taking OTC medications with some relief.  She denies any cough or choking after eat or drink.  But she does complain of feeling  something in the throat and feeling sore while swallowing solid food.  She became so weak over the weekend and sustained a fall this morning, feeling so weak she could not stand up on her own.  Denied any LOC, no head or neck injury denied any abdominal pain no urinary symptoms..  ED Course: Temperature 98.1 blood pressure 150/95 oxygen 90% on room air.  Chest x-ray showed right lower lobe infiltrates versus atelectasis.  Blood work showed WBC 8.7 hemoglobin 10.0 BUN 22 creatinine 1.2 bicarb 25K 4.1 calcium 11.4 albumin 3.6 glucose 134.  CT head and neck negative for fracture or dislocation.  UA negative for UTI  Patient was given ceftriaxone  and doxycycline  in the ED.  Review of Systems: As per HPI otherwise 14 point review of systems negative.    Past Medical History:  Diagnosis Date   Allergy    Diabetes mellitus without complication (HCC)    Renal insufficiency    Thyroid  disease     Past Surgical History:  Procedure Laterality Date   CHOLECYSTECTOMY       reports that she quit smoking about 21 years ago. Her smoking use  included cigarettes. She has never used smokeless tobacco. She reports that she does not currently use alcohol. She reports that she does not currently use drugs.  Allergies  Allergen Reactions   Penicillins Swelling and Rash    Has patient had a PCN reaction causing immediate rash, facial/tongue/throat swelling, SOB or lightheadedness with hypotension: No Has patient had a PCN reaction causing severe rash involving mucus membranes or skin necrosis: No Has patient had a PCN reaction that required hospitalization: No Has patient had a PCN reaction occurring within the last 10 years: No If all of the above answers are NO, then may proceed with Cephalosporin use.    Family History  Problem Relation Age of Onset   Diabetes Mother    Hypertension Mother    Hypertension Father    Cancer Brother      Prior to Admission medications   Medication Sig Start Date End Date Taking? Authorizing Provider  ACCU-CHEK AVIVA PLUS test strip  02/06/20   [provider]  albuterol  (VENTOLIN  HFA) 108 (90 Base) MCG/ACT inhaler Inhale 2 puffs into the lungs every 6 (six) hours as needed. 01/06/19   [provider]  atenolol  (TENORMIN ) 25 MG tablet Take 25 mg by mouth daily. 08/19/19   [provider]  Blood Glucose Calibration (ACCU-CHEK AVIVA) SOLN  10/17/17   [provider]  Blood Glucose Monitoring Suppl (ACCU-CHEK AVIVA PLUS) w/Device KIT See admin instructions. 01/06/19   [provider]  diclofenac   Sodium (VOLTAREN ) 1 % GEL Apply 2 g topically as needed. 01/06/19   [provider]  estradiol (ESTRACE) 0.5 MG tablet Take 0.5 mg by mouth daily.  04/07/16   [provider]  guaiFENesin -codeine  100-10 MG/5ML syrup Take 2.5 mLs by mouth every 6 (six) hours as needed for cough. 10/03/23   [provider]  levothyroxine  (SYNTHROID , LEVOTHROID) 25 MCG tablet TAKE 25 mcg TABLET EVERY DAY ON AN EMPTY STOMACH WITH A GLASS OF WATER AT LEAST 30 TO  60 MINUTES BEFORE BREAKFAST Patient taking differently: Take 25 mcg by mouth daily before breakfast. 03/29/17   Hongalgi, Anand D, MD  lidocaine  (LIDODERM ) 5 % Place 1 patch onto the skin every 12 (twelve) hours. Remove & Discard patch within 12 hours or as directed by MD 05/28/23 05/27/24  Ulis Bottcher, PA-C  losartan  (COZAAR ) 50 MG tablet Take 50 mg by mouth daily. 09/14/16   [provider]  metFORMIN  (GLUCOPHAGE ) 500 MG tablet TAKE 500 mg TABLET TWICE DAILY WITH MEALS  Do not take on 1/10 or 03/30/17 due to contrast given for CT scan. Restart taking on 03/31/17. Patient taking differently: Take 500 mg by mouth 2 (two) times daily with a meal. 03/29/17   Hongalgi, Trenda BIRCH, MD  montelukast  (SINGULAIR ) 10 MG tablet Take 10 mg by mouth daily. 11/14/16   [provider]  neomycin-polymyxin b-dexamethasone  (MAXITROL) 3.5-10000-0.1 SUSP Apply 1 drop to eye. 07/19/23   [provider]  omeprazole (PRILOSEC) 40 MG capsule Take 40 mg by mouth daily. Patient not taking: Reported on 10/01/2023 11/11/19   [provider]  pantoprazole  (PROTONIX ) 40 MG tablet Take 40 mg by mouth. 09/28/23 09/27/24  [provider]  predniSONE (DELTASONE) 20 MG tablet Take 20 mg by mouth daily with breakfast. 10/03/23   [provider]  progesterone  (PROMETRIUM ) 200 MG capsule Take 200 mg by mouth at bedtime.  09/14/16   [provider]  torsemide (DEMADEX) 20 MG tablet Take 20 mg by mouth daily. 04/30/23   [provider]    Physical Exam: Vitals:   12/10/23 1212 12/10/23 1216  BP: (!) 150/95   Pulse: 88   Resp: 18   Temp: 98.1 F (36.7 C)   TempSrc: Oral   SpO2: 99%   Weight:  78 kg  Height:  5' 5 (1.651 m)    Constitutional: NAD, calm, comfortable Vitals:   12/10/23 1212 12/10/23 1216  BP: (!) 150/95   Pulse: 88   Resp: 18   Temp: 98.1 F (36.7 C)   TempSrc: Oral   SpO2: 99%   Weight:  78 kg  Height:  5' 5 (1.651 m)   Eyes: PERRL, lids  and conjunctivae normal ENMT: Mucous membranes are dry. Posterior pharynx clear of any exudate or lesions.Normal dentition.  Neck: normal, supple, no masses, no thyromegaly Respiratory: clear to auscultation bilaterally, no wheezing, no crackles. Normal respiratory effort. No accessory muscle use.  Cardiovascular: Regular rate and rhythm, no murmurs / rubs / gallops. No extremity edema. 2+ pedal pulses. No carotid bruits.  Abdomen: no tenderness, no masses palpated. No hepatosplenomegaly. Bowel sounds positive.  Musculoskeletal: no clubbing / cyanosis. No joint deformity upper and lower extremities. Good ROM, no contractures. Normal muscle tone.  Skin: no rashes, lesions, ulcers. No induration Neurologic: CN 2-12 grossly intact. Sensation intact, DTR normal. Strength 5/5 in all 4.  Psychiatric: Normal judgment and insight. Alert and oriented x 3. Normal mood.     Labs on Admission: I have personally  reviewed following labs and imaging studies  CBC: Recent Labs  Lab 12/10/23 1245  WBC 8.7  NEUTROABS 6.3  HGB 10.0*  HCT 32.5*  MCV 88.6  PLT 289   Basic Metabolic Panel: Recent Labs  Lab 12/10/23 1245  NA 138  K 4.1  CL 105  CO2 25  GLUCOSE 134*  BUN 22  CREATININE 1.21*  CALCIUM 11.4*   GFR: Estimated Creatinine Clearance: 33.8 mL/min (A) (by C-G formula based on SCr of 1.21 mg/dL (H)). Liver Function Tests: Recent Labs  Lab 12/10/23 1245  AST 17  ALT 10  ALKPHOS 48  BILITOT 1.0  PROT 7.3  ALBUMIN 3.6   No results for input(s): LIPASE, AMYLASE in the last 168 hours. No results for input(s): AMMONIA in the last 168 hours. Coagulation Profile: No results for input(s): INR, PROTIME in the last 168 hours. Cardiac Enzymes: Recent Labs  Lab 12/10/23 1245  CKTOTAL 167   BNP (last 3 results) No results for input(s): PROBNP in the last 8760 hours. HbA1C: No results for input(s): HGBA1C in the last 72 hours. CBG: No results for input(s): GLUCAP in  the last 168 hours. Lipid Profile: No results for input(s): CHOL, HDL, LDLCALC, TRIG, CHOLHDL, LDLDIRECT in the last 72 hours. Thyroid  Function Tests: No results for input(s): TSH, T4TOTAL, FREET4, T3FREE, THYROIDAB in the last 72 hours. Anemia Panel: No results for input(s): VITAMINB12, FOLATE, FERRITIN, TIBC, IRON, RETICCTPCT in the last 72 hours. Urine analysis:    Component Value Date/Time   COLORURINE YELLOW (A) 12/10/2023 1245   APPEARANCEUR CLEAR (A) 12/10/2023 1245   APPEARANCEUR Hazy 06/19/2014 1924   LABSPEC 1.016 12/10/2023 1245   LABSPEC 1.010 06/19/2014 1924   PHURINE 6.0 12/10/2023 1245   GLUCOSEU NEGATIVE 12/10/2023 1245   GLUCOSEU Negative 06/19/2014 1924   HGBUR NEGATIVE 12/10/2023 1245   BILIRUBINUR NEGATIVE 12/10/2023 1245   BILIRUBINUR Negative 06/19/2014 1924   KETONESUR NEGATIVE 12/10/2023 1245   PROTEINUR 30 (A) 12/10/2023 1245   NITRITE NEGATIVE 12/10/2023 1245   LEUKOCYTESUR NEGATIVE 12/10/2023 1245   LEUKOCYTESUR 3+ 06/19/2014 1924    Radiological Exams on Admission: CT HEAD WO CONTRAST ( ) Result Date: 12/10/2023 CLINICAL DATA:  Provided history: Head trauma, minor. Neck trauma. Mechanical fall. EXAM: CT HEAD WITHOUT CONTRAST CT CERVICAL SPINE WITHOUT CONTRAST TECHNIQUE: Multidetector CT imaging of the head and cervical spine was performed following the standard protocol without intravenous contrast. Multiplanar CT image reconstructions of the cervical spine were also generated. RADIATION DOSE REDUCTION: This exam was performed according to the departmental dose-optimization program which includes automated exposure control, adjustment of the mA and/or kV according to patient size and/or use of iterative reconstruction technique. COMPARISON:  Head CT 05/01/2023. Brain MRI 06/09/2015. Cervical spine radiograph 06/28/2023. FINDINGS: CT HEAD FINDINGS Brain: Generalized cerebral atrophy. Patchy and ill-defined hypoattenuation  within the cerebral white matter, nonspecific but compatible with moderate chronic small vessel ischemic disease. There is no acute intracranial hemorrhage. No demarcated cortical infarct. No extra-axial fluid collection. No evidence of an intracranial mass. No midline shift. Vascular: No hyperdense vessel.  Atherosclerotic calcifications. Skull: No calvarial fracture or aggressive osseous lesion. Sinuses/Orbits: No mass or acute finding within the imaged orbits. No significant paranasal sinus disease at the imaged levels. CT CERVICAL SPINE FINDINGS Alignment: Nonspecific reversal of the expected cervical lordosis. Levocurvature of the cervical spine. 3 mm C2-C3 grade 1 retrolisthesis. Skull base and vertebrae: The basion-dental and atlanto-dental intervals are maintained.No evidence of acute fracture to the cervical spine. Soft tissues and  spinal canal: No prevertebral fluid or swelling. No visible canal hematoma. Disc levels: Cervical spondylosis with multilevel disc space narrowing, disc bulges/central disc protrusions, posterior disc osteophyte complexes, endplate spurring, uncovertebral hypertrophy and facet arthropathy. Disc space narrowing is greatest at C3-C4, C4-C5, C5-C6, C6-C7 and C7-T1 (advanced at these levels). No appreciable high-grade spinal canal stenosis. Multilevel bony neural foraminal narrowing. Multilevel ventral osteophytes (some bridging). Degenerative changes also present at the C1-C2 articulation. Upper chest: No consolidation within the imaged lung apices. No visible pneumothorax. IMPRESSION: CT head: 1.  No evidence of an acute intracranial abnormality. 2. Parenchymal atrophy and chronic small vessel ischemic disease. CT cervical spine: 1. No evidence of an acute cervical spine fracture. 2. 3 mm C2-C3 grade 1 anterolisthesis. 3. Nonspecific reversal of the expected cervical lordosis. 4. Levocurvature of the cervical spine. 5. Cervical spondylosis as described. Electronically Signed   By:  Rockey Childs D.O.   On: 12/10/2023 14:05   CT Cervical Spine Wo Contrast Result Date: 12/10/2023 CLINICAL DATA:  Provided history: Head trauma, minor. Neck trauma. Mechanical fall. EXAM: CT HEAD WITHOUT CONTRAST CT CERVICAL SPINE WITHOUT CONTRAST TECHNIQUE: Multidetector CT imaging of the head and cervical spine was performed following the standard protocol without intravenous contrast. Multiplanar CT image reconstructions of the cervical spine were also generated. RADIATION DOSE REDUCTION: This exam was performed according to the departmental dose-optimization program which includes automated exposure control, adjustment of the mA and/or kV according to patient size and/or use of iterative reconstruction technique. COMPARISON:  Head CT 05/01/2023. Brain MRI 06/09/2015. Cervical spine radiograph 06/28/2023. FINDINGS: CT HEAD FINDINGS Brain: Generalized cerebral atrophy. Patchy and ill-defined hypoattenuation within the cerebral white matter, nonspecific but compatible with moderate chronic small vessel ischemic disease. There is no acute intracranial hemorrhage. No demarcated cortical infarct. No extra-axial fluid collection. No evidence of an intracranial mass. No midline shift. Vascular: No hyperdense vessel.  Atherosclerotic calcifications. Skull: No calvarial fracture or aggressive osseous lesion. Sinuses/Orbits: No mass or acute finding within the imaged orbits. No significant paranasal sinus disease at the imaged levels. CT CERVICAL SPINE FINDINGS Alignment: Nonspecific reversal of the expected cervical lordosis. Levocurvature of the cervical spine. 3 mm C2-C3 grade 1 retrolisthesis. Skull base and vertebrae: The basion-dental and atlanto-dental intervals are maintained.No evidence of acute fracture to the cervical spine. Soft tissues and spinal canal: No prevertebral fluid or swelling. No visible canal hematoma. Disc levels: Cervical spondylosis with multilevel disc space narrowing, disc bulges/central  disc protrusions, posterior disc osteophyte complexes, endplate spurring, uncovertebral hypertrophy and facet arthropathy. Disc space narrowing is greatest at C3-C4, C4-C5, C5-C6, C6-C7 and C7-T1 (advanced at these levels). No appreciable high-grade spinal canal stenosis. Multilevel bony neural foraminal narrowing. Multilevel ventral osteophytes (some bridging). Degenerative changes also present at the C1-C2 articulation. Upper chest: No consolidation within the imaged lung apices. No visible pneumothorax. IMPRESSION: CT head: 1.  No evidence of an acute intracranial abnormality. 2. Parenchymal atrophy and chronic small vessel ischemic disease. CT cervical spine: 1. No evidence of an acute cervical spine fracture. 2. 3 mm C2-C3 grade 1 anterolisthesis. 3. Nonspecific reversal of the expected cervical lordosis. 4. Levocurvature of the cervical spine. 5. Cervical spondylosis as described. Electronically Signed   By: Rockey Childs D.O.   On: 12/10/2023 14:05   DG Chest 2 View Result Date: 12/10/2023 CLINICAL DATA:  Cough EXAM: CHEST - 2 VIEW COMPARISON:  Chest radiograph dated 11/26/2013 FINDINGS: Normal lung volumes. Patchy right basilar opacity. No pleural effusion or pneumothorax. Similar mildly enlarged cardiomediastinal  silhouette. No acute osseous abnormality. IMPRESSION: Patchy right basilar opacity, which may represent atelectasis, aspiration, or pneumonia. Electronically Signed   By: Limin  Xu M.D.   On: 12/10/2023 14:02   CT ABDOMEN PELVIS WO CONTRAST Result Date: 12/10/2023 CLINICAL DATA:  Status post mechanical fall and unable to stand EXAM: CT ABDOMEN AND PELVIS WITHOUT CONTRAST TECHNIQUE: Multidetector CT imaging of the abdomen and pelvis was performed following the standard protocol without IV contrast. RADIATION DOSE REDUCTION: This exam was performed according to the departmental dose-optimization program which includes automated exposure control, adjustment of the mA and/or kV according to  patient size and/or use of iterative reconstruction technique. COMPARISON:  MRI abdomen dated 08/12/2011 FINDINGS: Lower chest: No focal consolidation or pulmonary nodule in the lung bases. No pleural effusion or pneumothorax demonstrated. Partially imaged heart size is normal. Coronary artery calcifications. Hepatobiliary: Segment 2/4 and 6 hypodensities in keeping with nonenhancing cysts on prior MRI. The segment 6 lesion has increased in size to 1.8 x 1.5 cm, previously 1.0 by 0.8 cm. No intra or extrahepatic biliary ductal dilation. Cholecystectomy. Pancreas: No focal lesions or main ductal dilation. Spleen: Normal in size without focal abnormality. Adrenals/Urinary Tract: Unchanged 1.4 cm left adrenal nodule (3:30), in keeping with adenoma. No specific follow-up imaging recommended. No right adrenal nodule. No suspicious renal masses by noncontrast technique. 3.4 cm simple cyst in the anterior interpolar right kidney. Nonobstructing right lower pole stones measuring up to 4 mm. Hydronephrosis. No focal bladder wall thickening. Stomach/Bowel: Normal appearance of the stomach. Very large sigmoid diverticulum demonstrates marked mural thickening (3:63). Appendix is not discretely seen. Vascular/Lymphatic: Aortic atherosclerosis. No enlarged abdominal or pelvic lymph nodes. Reproductive: No adnexal masses. Other: No free fluid, fluid collection, or free air. Musculoskeletal: No acute or abnormal lytic or blastic osseous lesions. Multilevel degenerative changes of the partially imaged thoracic and lumbar spine. IMPRESSION: 1. No acute traumatic injury in the abdomen or pelvis. 2. Very large sigmoid diverticulum demonstrates marked mural thickening, which may represent sequela of chronic diverticulitis. Recommend correlation with any history of recent colonoscopy. 3. Nonobstructing right lower pole renal stones measuring up to 4 mm. 4. Aortic Atherosclerosis (ICD10-I70.0). Coronary artery calcifications. Assessment  for potential risk factor modification, dietary therapy or pharmacologic therapy may be warranted, if clinically indicated. Electronically Signed   By: Limin  Xu M.D.   On: 12/10/2023 13:49    EKG: Independently reviewed.  Sinus rhythm, no acute ST changes.  Assessment/Plan Principal Problem:   PNA (pneumonia) Active Problems:   Impaired ambulation   CAP (community acquired pneumonia)  (please populate well all problems here in Problem List. (For example, if patient is on BP meds at home and you resume or decide to hold them, it is a problem that needs to be her. Same for CAD, COPD, HLD and so on)  Acute ambulation impairment Generalized weakness -Secondary to pneumonia -CT head and neck negative for fracture or dislocations.  CK level within normal limits. -PT evaluation  CAP, bacterial -Continue current CAP coverage with ceftriaxone  and azithromycin  -Breathing treatment -Robitussin and Tessalon  -CT chest without contrast to further characterize pneumonia  Dysphagia - Appears to be at laryngeal-upper esophageal level, will consult speech therapist.  Hypercalcemia -IV hydration and reevaluate  CKD stage IIIb - Appears to be volume contracted, creatinine level stable - IV fluid, recheck kidney function tomorrow  HTN - Stable, continue atenolol   IIDM -SSI for now.  DVT prophylaxis: Lovenox  Code Status: Full code Family Communication: None at bedside Disposition Plan:  Expect less than 2 midnight hospital stay Consults called: None Admission status: Telemetry observation   Cort ONEIDA Mana MD [facility] Pager [phone]  12/10/2023, 3:55 PM

## 2023-12-10 NOTE — ED Triage Notes (Signed)
 Pt to ED via [facility] from home. Pt states she had a mechanical fall at 0200 this morning and could not get up. Pt denies any new complaints. Pt states she has chronic foot pain, leg pain, and difficulty walking. Per EMS pt did not hit her head.

## 2023-12-10 NOTE — ED Provider Notes (Signed)
 [facility] Provider Note    Event Date/Time   First MD Initiated Contact with Patient 12/10/23 1207     (approximate)   History   Fall   HPI  Sherry [name] is a 89 y.o. female with known history of lymphedema, diabetes, hypertension who comes in for a fall.  Unclear why she fell.  She reports no new pain in her hips.  She does not know if she hit her head.  When asked her why she fell she did not know.  Patient states that she fell last night and that she has been on the ground since then.  She reports that she does live at home with her husband.  She uses a walker to ambulate.  I reviewed the notes the patient does have a history of lymphedema.  She is supposed to wear compression stocks for it.  She has had this worked up previously She denies this worsening.   Physical Exam   Triage Vital Signs: ED Triage Vitals  Encounter Vitals Group     BP 12/10/23 1212 (!) 150/95     Girls Systolic BP Percentile --      Girls Diastolic BP Percentile --      Boys Systolic BP Percentile --      Boys Diastolic BP Percentile --      Pulse Rate 12/10/23 1212 88     Resp --      Temp 12/10/23 1212 98.1 F (36.7 C)     Temp Source 12/10/23 1212 Oral     SpO2 12/10/23 1212 99 %     Weight 12/10/23 1216 171 lb 15.3 oz (78 kg)     Height 12/10/23 1216 5' 5 (1.651 m)     Head Circumference --      Peak Flow --      Pain Score --      Pain Loc --      Pain Education --      Exclude from Growth Chart --     Most recent vital signs: Vitals:   12/10/23 1212  BP: (!) 150/95  Pulse: 88  Temp: 98.1 F (36.7 C)  SpO2: 99%     General: Awake, no distress.  CV:  Good peripheral perfusion.  Resp:  Normal effort.  Abd:  No distention.  Slightly tender Other:  Swelling noted in the legs which is at baseline per patient No obvious trauma noted  ED Results / Procedures / Treatments   Labs (all labs ordered are listed, but only abnormal results are  displayed) Labs Reviewed  CBC WITH DIFFERENTIAL/PLATELET - Abnormal; Notable for the following components:      Result Value   RBC 3.67 (*)    Hemoglobin 10.0 (*)    HCT 32.5 (*)    All other components within normal limits  COMPREHENSIVE METABOLIC PANEL WITH GFR - Abnormal; Notable for the following components:   Glucose, Bld 134 (*)    Creatinine, Ser 1.21 (*)    Calcium 11.4 (*)    GFR, Estimated 43 (*)    All other components within normal limits  CK  URINALYSIS, ROUTINE W REFLEX MICROSCOPIC  TROPONIN I (HIGH SENSITIVITY)  TROPONIN I (HIGH SENSITIVITY)     EKG  My interpretation of EKG:  Normal sinus rate of 80 without any ST elevation, T wave version in V3, right bundle branch block.  Similar to prior EKG  RADIOLOGY I have reviewed the ct personally and interpreted no  evidence of intercranial hemorrhage   PROCEDURES:  Critical Care performed: No  Procedures   MEDICATIONS ORDERED IN ED: Medications - No data to display   IMPRESSION / MDM / ASSESSMENT AND PLAN / ED COURSE  I reviewed the triage vital signs and the nursing notes.   Patient's presentation is most consistent with acute presentation with potential threat to life or bodily function.   Patient comes in for a fall.  I reviewed some notes where patient has a history of chronic lymphedema and I also reviewed a note from neurosurgery onset 10/01/2023 where she has chronic difficulties with ambulating.  She does come from a facility.  Neurosurgery evaluated her for possible C1-C2 fusion but patient had declined.  Troponin is negative and symptoms happened many hours ago.  She has no chest pain.  CK normal.  Hemoglobin is at baseline.  CMP shows creatinine at baseline.  CT head without evidence of acute injury and she has got no pain in her neck upon evaluation.  Patient's x-ray does concern show concerns for pneumonia versus atelectasis.  She does report 2 weeks of coughing with productive sputum.   Discussed with patient given her fall where she was so weak that she could not get up off the ground for over 8 hours and in the setting of this cough with pneumonia I did recommend admission to the hospital due to new acute weakness in the setting of pneumonia.  She is not hypoxic.  I will get COVID test as well will cover with antibiotics.  Patient does have some swelling her legs I will get ultrasound just to ensure no DVTs.  Will discuss the hospital team for admission  The patient is on the cardiac monitor to evaluate for evidence of arrhythmia and/or significant heart rate changes.      FINAL CLINICAL IMPRESSION(S) / ED DIAGNOSES   Final diagnoses:  Fall, initial encounter  Weakness  Pneumonia due to infectious organism, unspecified laterality, unspecified part of lung     Rx / DC Orders   ED Discharge Orders     None        Note:  This document was prepared using Dragon voice recognition software and may include unintentional dictation errors.   Ernest Ronal BRAVO, MD 12/10/23 [phone]

## 2023-12-10 NOTE — ED Notes (Signed)
 Pt to [place] at this time.

## 2023-12-11 ENCOUNTER — Observation Stay

## 2023-12-11 DIAGNOSIS — R221 Localized swelling, mass and lump, neck: Secondary | ICD-10-CM | POA: Diagnosis not present

## 2023-12-11 DIAGNOSIS — R262 Difficulty in walking, not elsewhere classified: Secondary | ICD-10-CM

## 2023-12-11 DIAGNOSIS — R634 Abnormal weight loss: Secondary | ICD-10-CM | POA: Diagnosis present

## 2023-12-11 DIAGNOSIS — J189 Pneumonia, unspecified organism: Secondary | ICD-10-CM | POA: Diagnosis not present

## 2023-12-11 LAB — CBC
HCT: 31.8 % — ABNORMAL LOW (ref 36.0–46.0)
Hemoglobin: 10.1 g/dL — ABNORMAL LOW (ref 12.0–15.0)
MCH: 27.9 pg (ref 26.0–34.0)
MCHC: 31.8 g/dL (ref 30.0–36.0)
MCV: 87.8 fL (ref 80.0–100.0)
Platelets: 291 K/uL (ref 150–400)
RBC: 3.62 MIL/uL — ABNORMAL LOW (ref 3.87–5.11)
RDW: 13.6 % (ref 11.5–15.5)
WBC: 10.5 K/uL (ref 4.0–10.5)
nRBC: 0 % (ref 0.0–0.2)

## 2023-12-11 LAB — BASIC METABOLIC PANEL WITH GFR
Anion gap: 7 (ref 5–15)
BUN: 23 mg/dL (ref 8–23)
CO2: 25 mmol/L (ref 22–32)
Calcium: 11 mg/dL — ABNORMAL HIGH (ref 8.9–10.3)
Chloride: 107 mmol/L (ref 98–111)
Creatinine, Ser: 1.16 mg/dL — ABNORMAL HIGH (ref 0.44–1.00)
GFR, Estimated: 46 mL/min — ABNORMAL LOW (ref >60)
Glucose, Bld: 152 mg/dL — ABNORMAL HIGH (ref 70–99)
Potassium: 4.5 mmol/L (ref 3.5–5.1)
Sodium: 139 mmol/L (ref 135–145)

## 2023-12-11 LAB — GLUCOSE, CAPILLARY
Glucose-Capillary: 136 mg/dL — ABNORMAL HIGH (ref 70–99)
Glucose-Capillary: 205 mg/dL — ABNORMAL HIGH (ref 70–99)
Glucose-Capillary: 115 mg/dL — ABNORMAL HIGH (ref 70–99)

## 2023-12-11 MED ORDER — HYDROCOD POLI-CHLORPHE POLI ER 10-8 MG/5ML PO SUER: 5.0000 mL | Freq: Two times a day (BID) | ORAL | Status: DC | PRN

## 2023-12-11 NOTE — Plan of Care (Signed)
   Problem: Clinical Measurements: Goal: Respiratory complications will improve Outcome: Progressing

## 2023-12-11 NOTE — Care Management Obs Status (Signed)
 MEDICARE OBSERVATION STATUS NOTIFICATION   Patient Details  Name: Sherry [name] MRN: [id number] Date of Birth: 02-27-1935   Medicare Observation Status Notification Given:  Yes    Khaden Gater W, CMA 12/11/2023, 11:12 AM

## 2023-12-11 NOTE — Evaluation (Signed)
 Physical Therapy Evaluation Patient Details Name: Sherry [name] MRN: [id number] DOB: October 30, 1934 Today's Date: 12/11/2023  History of Present Illness  Sherry [name] is a 89 y.o. female with medical history significant of HTN, IIDM, HLD, hypothyroidism, CKD stage IIIb, presented with worsening of productive cough, weakness and fall.  Clinical Impression  Pt is a pleasant 89 year old female who was admitted for weakness and fall. Pt lives alone. Prior to hospitalization, pt amb using rollator and able to perform ADLs independently. Pt reports needing to take frequent seated breaks when ambulating. Pt performs bed mobility with supervision using bed features. She required increased time to move from supine>sit however no physical assistance. Pt performed transfer and ambulation with CGA-min A. Pt requires min A for STS for lift, especially from low toilet height. She amb at dec gait speed and relies heavily on RW for balance. Pt demonstrates deficits with strength/balance/activity tolerance. Would benefit from skilled PT to address above deficits and promote optimal return to PLOF.        If plan is discharge home, recommend the following: A little help with walking and/or transfers;A little help with bathing/dressing/bathroom;Assistance with cooking/housework;Help with stairs or ramp for entrance;Assist for transportation   Can travel by private vehicle        Equipment Recommendations None recommended by PT  Recommendations for Other Services       Functional Status Assessment Patient has had a recent decline in their functional status and demonstrates the ability to make significant improvements in function in a reasonable and predictable amount of time.     Precautions / Restrictions Precautions Precautions: Fall Recall of Precautions/Restrictions: Intact Restrictions Weight Bearing Restrictions Per Provider Order: No      Mobility  Bed Mobility Overal bed mobility:  Needs Assistance Bed Mobility: Supine to Sit     Supine to sit: Supervision, HOB elevated, Used rails     General bed mobility comments: Able to rmove from supine>sit with increased time. Use of bed rails and HOB elevated.    Transfers Overall transfer level: Needs assistance Equipment used: Rolling walker (2 wheels) Transfers: Sit to/from Stand, Bed to chair/wheelchair/BSC Sit to Stand: Min assist   Step pivot transfers: Contact guard assist       General transfer comment: Min A for lift from lowest bed height and from toilet.    Ambulation/Gait Ambulation/Gait assistance: Contact guard assist Gait Distance (Feet): 30 Feet Assistive device: Rolling walker (2 wheels) Gait Pattern/deviations: Step-through pattern, Decreased dorsiflexion - right, Decreased dorsiflexion - left Gait velocity: decreased     General Gait Details: Pt demonstrates decreased DF on BLEs and almost shuffles to amb.  Stairs            Wheelchair Mobility     Tilt Bed    Modified Rankin (Stroke Patients Only)       Balance Overall balance assessment: Needs assistance Sitting-balance support: Feet supported Sitting balance-Leahy Scale: Good Sitting balance - Comments: able to maintain seated balance at EOB and seated on toilet. Able to assist with donning new gown     Standing balance-Leahy Scale: Fair Standing balance comment: Heavy reliance on RW for balance. No LOB noted.                             Pertinent Vitals/Pain Pain Assessment Pain Assessment: Faces Faces Pain Scale: Hurts a little bit Pain Location: R shoulder Pain Descriptors / Indicators: Discomfort Pain Intervention(s): Monitored  during session    Home Living Family/patient expects to be discharged to:: Private residence Living Arrangements: Alone Available Help at Discharge: Family;Available PRN/intermittently Type of Home: House Home Access: Stairs to enter Entrance Stairs-Rails:  None Entrance Stairs-Number of Steps: 2-3 Alternate Level Stairs-Number of Steps: 17 Home Layout: Two level;Able to live on main level with bedroom/bathroom Home Equipment: Rolling Walker (2 wheels);Rollator (4 wheels);Cane - single point;BSC/3in1;Grab bars - toilet;Grab bars - tub/shower      Prior Function Prior Level of Function : Independent/Modified Independent             Mobility Comments: Uses rollator for ambulation. Reports frequent seated breaks on rollator when amb. ADLs Comments: independent     Extremity/Trunk Assessment   Upper Extremity Assessment Upper Extremity Assessment: Generalized weakness    Lower Extremity Assessment Lower Extremity Assessment: Generalized weakness    Cervical / Trunk Assessment Cervical / Trunk Assessment: Normal  Communication   Communication Communication: No apparent difficulties    Cognition Arousal: Alert Behavior During Therapy: WFL for tasks assessed/performed   PT - Cognitive impairments: No apparent impairments                       PT - Cognition Comments: pleasant and agreeable to PT session. A&Ox3 Following commands: Impaired Following commands impaired: Follows one step commands inconsistently, Follows one step commands with increased time     Cueing Cueing Techniques: Verbal cues, Visual cues     General Comments      Exercises Other Exercises Other Exercises: Assistance to bathroom. Min A to stand from low toilet height. SBA for hygeine   Assessment/Plan    PT Assessment Patient needs continued PT services  PT Problem List Decreased strength;Decreased activity tolerance;Decreased balance;Decreased mobility       PT Treatment Interventions Gait training;Stair training;Functional mobility training;Therapeutic activities;Therapeutic exercise;Balance training;Neuromuscular re-education;Patient/family education    PT Goals (Current goals can be found in the Care Plan section)  Acute Rehab PT  Goals Patient Stated Goal: to go home PT Goal Formulation: With patient Time For Goal Achievement: 12/25/23 Potential to Achieve Goals: Fair    Frequency Min 1X/week     Co-evaluation               AM-PAC PT 6 Clicks Mobility  Outcome Measure Help needed turning from your back to your side while in a flat bed without using bedrails?: None Help needed moving from lying on your back to sitting on the side of a flat bed without using bedrails?: None Help needed moving to and from a bed to a chair (including a wheelchair)?: A Little Help needed standing up from a chair using your arms (e.g., wheelchair or bedside chair)?: A Little Help needed to walk in hospital room?: A Little Help needed climbing 3-5 steps with a railing? : A Lot 6 Click Score: 19    End of Session Equipment Utilized During Treatment: Gait belt Activity Tolerance: Patient tolerated treatment well Patient left: in chair;with call bell/phone within reach;with chair alarm set Nurse Communication: Mobility status PT Visit Diagnosis: Unsteadiness on feet (R26.81);Muscle weakness (generalized) (M62.81);Difficulty in walking, not elsewhere classified (R26.2)    Time: 9043-8968 PT Time Calculation (min) (ACUTE ONLY): 35 min   Charges:                 Rhiley Solem, SPT   Pailynn Vahey 12/11/2023, 11:29 AM

## 2023-12-11 NOTE — Plan of Care (Signed)

## 2023-12-11 NOTE — Progress Notes (Signed)
 Progress Note   Patient: Sherry [name] FMW:[id number] DOB: Jul 21, 1934 DOA: 12/10/2023     0 DOS: the patient was seen and examined on 12/11/2023   Brief hospital course: SUZETTE FLAGLER is a 89 y.o. female with medical history significant of HTN, IIDM, HLD, hypothyroidism, CKD stage IIIb, presented with worsening of productive cough, weakness and fall.   Symptoms started about 3 weeks ago, patient started to develop productive cough with whitish phlegm, episode chills but no fever.  No chest pains.  She has been taking OTC medications with some relief.  She denies any cough or choking after eat or drink.  But she does complain of feeling  something in the throat and feeling sore while swallowing solid food.  She became so weak over the weekend and sustained a fall this morning, feeling so weak she could not stand up on her own.  Denied any LOC, no head or neck injury denied any abdominal pain no urinary symptoms..   ED Course: Temperature 98.1 blood pressure 150/95 oxygen 90% on room air.  Chest x-ray showed right lower lobe infiltrates versus atelectasis.  Blood work showed WBC 8.7 hemoglobin 10.0 BUN 22 creatinine 1.2 bicarb 25K 4.1 calcium 11.4 albumin 3.6 glucose 134.  CT head and neck negative for fracture or dislocation.  UA negative for UTI   Patient was given ceftriaxone  and doxycycline  in the ED.  Patient was admitted to the hospital and started on empiric IV antibiotics for pneumonia pending further evaluation.    Further hospital course and management as outlined below.   Assessment and Plan:  Acute ambulation impairment Generalized weakness Secondary to pneumonia most likely. CT head and C-spine were negative for acute findings.  CK level within normal limits. -PT evaluation -Fall precautions   CAP, bacterial Cough - 4 months -Continue ceftriaxone  and azithromycin  -Bronchodilators, Robitussin and Tessalon  -Add Tussionex, above not helping per pt -CT chest  without contrast was obtained on admission to further characterize pneumonia, shows new suspicious nodule as below.  New spiculated pulmonary nodule Concerning in setting of recent wt loss, cough of 4 months, hypercalcemia.   Hx of pulmonary nodules  Radiology recommendations for follow up: Consider one of the following in 3 months for both low-risk and high-risk individuals: (a) repeat chest CT, (b) follow-up PET-CT, or (c) tissue sampling  --Consult Pulmonology for input  Dysphagia - Appears to be at laryngeal-upper esophageal level, will consult speech therapist.   Hypercalcemia - etiology to be determined, but based on CT chest finding on new spiculated nodule, concerning for malignancy. Ca 11.4 >> 11.0 this AM -IV hydration  -Monitor BMP  Unintentional weight loss - pt reports 30# wt loss in recent 4 months, she attributes poor appetite, swallowing issues. Given CT chest    CKD stage IIIb Pt clinically volume depleted on admission., Renal function stable Cr 1.21 >> 1.16 today - Monitor BMP   HTN - Stable, continue atenolol    IIDM -sliding scale novolog       Subjective: Pt seen up in recliner this AM.  She reports her cough has been worsening over past 4 months.  States she's lost 30# due to poor appetite and coughing.  States Mucinex  does not help, current cough meds being given not really helpful.  She denies fever but has intermittent chills.  No other acute complaints.   Physical Exam: Vitals:   12/10/23 1747 12/10/23 1957 12/11/23 0428 12/11/23 0846  BP: (!) 146/56 (!) 153/59 (!) 140/53 (!) 131/52  Pulse: 77  77 73 70  Resp: 17 20 16 16   Temp: 97.9 F (36.6 C) 97.7 F (36.5 C) 98.6 F (37 C) 98.3 F (36.8 C)  TempSrc: Oral Oral  Oral  SpO2: 98% 99% 97% 99%  Weight:      Height:       General exam: awake, alert, no acute distress HEENT: moist mucus membranes, hearing grossly normal  Respiratory system: CTAB, no wheezes, rales or rhonchi, normal  respiratory effort. Cardiovascular system: normal S1/S2, RRR Gastrointestinal system: soft, NT, ND Central nervous system: A&O x3. no gross focal neurologic deficits, normal speech Skin: dry, intact, normal temperature Psychiatry: normal mood, congruent affect, judgement and insight appear normal    Data Reviewed:  Notable labs---  Glucose 152 Cr improved 1.16 Ca 11.4 >> 11.0 Hbg 10.1  Family Communication: None present on rounds. Will attempt to call as time allows.   Disposition: Status is: Observation Remains admitted on IV antibiotics and fluids pending further improvement, electrolyte improvement, ongoing evaluation    Planned Discharge Destination: Home    Time spent: 45 minutes  Author: Burnard DELENA Cunning, DO 12/11/2023 3:10 PM  For on call review [email or web address].

## 2023-12-12 ENCOUNTER — Encounter: Payer: Self-pay | Admitting: Internal Medicine

## 2023-12-12 DIAGNOSIS — J189 Pneumonia, unspecified organism: Secondary | ICD-10-CM | POA: Diagnosis not present

## 2023-12-12 DIAGNOSIS — J9621 Acute and chronic respiratory failure with hypoxia: Secondary | ICD-10-CM | POA: Diagnosis not present

## 2023-12-12 LAB — BASIC METABOLIC PANEL WITH GFR
Anion gap: 7 (ref 5–15)
BUN: 22 mg/dL (ref 8–23)
CO2: 23 mmol/L (ref 22–32)
Calcium: 10.5 mg/dL — ABNORMAL HIGH (ref 8.9–10.3)
Chloride: 107 mmol/L (ref 98–111)
Creatinine, Ser: 1.4 mg/dL — ABNORMAL HIGH (ref 0.44–1.00)
GFR, Estimated: 36 mL/min — ABNORMAL LOW (ref >60)
Glucose, Bld: 138 mg/dL — ABNORMAL HIGH (ref 70–99)
Potassium: 4.3 mmol/L (ref 3.5–5.1)
Sodium: 137 mmol/L (ref 135–145)

## 2023-12-12 LAB — GLUCOSE, CAPILLARY
Glucose-Capillary: 137 mg/dL — ABNORMAL HIGH (ref 70–99)
Glucose-Capillary: 174 mg/dL — ABNORMAL HIGH (ref 70–99)
Glucose-Capillary: 163 mg/dL — ABNORMAL HIGH (ref 70–99)
Glucose-Capillary: 127 mg/dL — ABNORMAL HIGH (ref 70–99)

## 2023-12-12 LAB — CBC
HCT: 28.6 % — ABNORMAL LOW (ref 36.0–46.0)
Hemoglobin: 9 g/dL — ABNORMAL LOW (ref 12.0–15.0)
MCH: 28 pg (ref 26.0–34.0)
MCHC: 31.5 g/dL (ref 30.0–36.0)
MCV: 88.8 fL (ref 80.0–100.0)
Platelets: 268 K/uL (ref 150–400)
RBC: 3.22 MIL/uL — ABNORMAL LOW (ref 3.87–5.11)
RDW: 13.8 % (ref 11.5–15.5)
WBC: 7.8 K/uL (ref 4.0–10.5)
nRBC: 0 % (ref 0.0–0.2)

## 2023-12-12 LAB — FOLATE: Folate: 16.1 ng/mL (ref >5.9)

## 2023-12-12 LAB — IRON AND TIBC
Iron: 54 ug/dL (ref 28–170)
Saturation Ratios: 24 % (ref 10.4–31.8)
TIBC: 230 ug/dL — ABNORMAL LOW (ref 250–450)
UIBC: 176 ug/dL

## 2023-12-12 LAB — SEDIMENTATION RATE: Sed Rate: 54 mm/h — ABNORMAL HIGH (ref 0–30)

## 2023-12-12 LAB — C-REACTIVE PROTEIN: CRP: 2.2 mg/dL — ABNORMAL HIGH (ref <1.0)

## 2023-12-12 LAB — PHOSPHORUS: Phosphorus: 2.6 mg/dL (ref 2.5–4.6)

## 2023-12-12 LAB — VITAMIN B12: Vitamin B-12: 362 pg/mL (ref 180–914)

## 2023-12-12 LAB — MYCOPLASMA PNEUMONIAE ANTIBODY, IGM: Mycoplasma pneumo IgM: <770 U/mL (ref 0–769)

## 2023-12-12 LAB — MAGNESIUM: Magnesium: 1.7 mg/dL (ref 1.7–2.4)

## 2023-12-12 MED ORDER — ACETAMINOPHEN 325 MG PO TABS
650.0000 mg | ORAL_TABLET | Freq: Once | ORAL | Status: AC
  Administered 2023-12-12: 650 mg via ORAL
  Filled 2023-12-12: qty 2

## 2023-12-12 MED ORDER — METHOCARBAMOL 500 MG PO TABS: 500.0000 mg | ORAL_TABLET | Freq: Three times a day (TID) | ORAL | Status: DC | PRN

## 2023-12-12 MED ORDER — SODIUM CHLORIDE 0.9 % IV SOLN: INTRAVENOUS | Status: AC

## 2023-12-12 MED ORDER — VITAMIN B-12 1000 MCG PO TABS
500.0000 ug | ORAL_TABLET | Freq: Every day | ORAL | Status: DC
Start: 2023-12-12 — End: 2023-12-19
  Administered 2023-12-12 – 2023-12-18 (×7): 500 ug via ORAL
  Filled 2023-12-12 (×7): qty 1

## 2023-12-12 MED ORDER — METHOCARBAMOL 500 MG PO TABS
500.0000 mg | ORAL_TABLET | Freq: Once | ORAL | Status: AC
  Administered 2023-12-12: 500 mg via ORAL
  Filled 2023-12-12: qty 1

## 2023-12-12 NOTE — Consult Note (Signed)
 PULMONOLOGY         Date: 12/12/2023,   MRN# [id number] Sherry [name] 09-10-1934     AdmissionWeight: 78 kg                 CurrentWeight: 78 kg  Referring provider: Dr Kandis   CHIEF COMPLAINT:   Acute on chronic hypoxemic respiratory failure   HISTORY OF PRESENT ILLNESS   Sherry [name] is a 89 y.o. female with medical history significant of HTN, IIDM, HLD, hypothyroidism, CKD stage IIIb, presented with worsening of productive cough, weakness and fall. Sherry [name] also has chronic cough and lung nodules.  Sherry [name] has dysphagia and expresses aspiration concerns. Her bloodwork was fairly stable compared to recent with CKD and no alarming findings.  Sherry [name] had chronic LLL atelectasis.  Sherry [name] has Ggo of left upper lobe which may be malignant.      PAST MEDICAL HISTORY   Past Medical History:  Diagnosis Date   Allergy    Diabetes mellitus without complication (HCC)    Renal insufficiency    Thyroid  disease      SURGICAL HISTORY   Past Surgical History:  Procedure Laterality Date   CHOLECYSTECTOMY       FAMILY HISTORY   Family History  Problem Relation Age of Onset   Diabetes Mother    Hypertension Mother    Hypertension Father    Cancer Brother      SOCIAL HISTORY   Social History   Tobacco Use   Smoking status: Former    Current packs/day: 0.00    Types: Cigarettes    Quit date: 2004    Years since quitting: 21.7   Smokeless tobacco: Never  Vaping Use   Vaping status: Never Used  Substance Use Topics   Alcohol use: Not Currently   Drug use: Not Currently     MEDICATIONS    Home Medication:    Current Medication:  Current Facility-Administered Medications:    0.9 %  sodium chloride  infusion, , Intravenous, Continuous, Von Bellis, MD, Last Rate: 75 mL/hr at 12/12/23 1154, New Bag at 12/12/23 1154   acetaminophen  (TYLENOL ) tablet 650 mg, 650 mg, Oral, Q6H PRN **OR** acetaminophen  (TYLENOL ) suppository 650 mg, 650 mg, Rectal, Q6H PRN,  Laurita Manor T, MD   acetaminophen  (TYLENOL ) tablet 650 mg, 650 mg, Oral, Once, Von Bellis, MD   albuterol  (PROVENTIL ) (2.5 MG/3ML) 0.083% nebulizer solution 2.5 mg, 2.5 mg, Inhalation, Q6H PRN, Laurita Manor T, MD   atenolol  (TENORMIN ) tablet 25 mg, 25 mg, Oral, Daily, Laurita, Ping T, MD, 25 mg at 12/12/23 1041   azithromycin  (ZITHROMAX ) tablet 500 mg, 500 mg, Oral, Daily, Laurita, Ping T, MD, 500 mg at 12/12/23 1042   benzonatate  (TESSALON ) capsule 100 mg, 100 mg, Oral, TID PRN, Zhang, Ping T, MD   cefTRIAXone  (ROCEPHIN ) 2 g in sodium chloride  0.9 % 100 mL IVPB, 2 g, Intravenous, Q24H, Laurita Manor T, MD, Last Rate: 200 mL/hr at 12/12/23 1055, 2 g at 12/12/23 1055   chlorpheniramine-HYDROcodone (TUSSIONEX) 10-8 MG/5ML suspension 5 mL, 5 mL, Oral, Q12H PRN, Fausto Sor A, DO   enoxaparin  (LOVENOX ) injection 40 mg, 40 mg, Subcutaneous, Q24H, Zhang, Ping T, MD, 40 mg at 12/11/23 1649   feeding supplement (ENSURE PLUS HIGH PROTEIN) liquid 237 mL, 237 mL, Oral, BID BM, Mansy, Jan A, MD, 237 mL at 12/12/23 1044   guaiFENesin  (MUCINEX ) 12 hr tablet 600 mg, 600 mg, Oral, BID, Laurita Manor T, MD, 600 mg at 12/12/23 1042  Influenza vac split trivalent PF (FLUZONE HIGH-DOSE) injection 0.5 mL, 0.5 mL, Intramuscular, Tomorrow-1000, Mansy, Jan A, MD   insulin  aspart (novoLOG ) injection 0-9 Units, 0-9 Units, Subcutaneous, TID WC, Laurita Cort DASEN, MD, 1 Units at 12/12/23 9191   levothyroxine  (SYNTHROID ) tablet 25 mcg, 25 mcg, Oral, QAC breakfast, Laurita Cort T, MD, 25 mcg at 12/12/23 0541   losartan  (COZAAR ) tablet 50 mg, 50 mg, Oral, Daily, Laurita Cort T, MD, 50 mg at 12/12/23 1043   methocarbamol  (ROBAXIN ) tablet 500 mg, 500 mg, Oral, Once, Von Bellis, MD   methocarbamol  (ROBAXIN ) tablet 500 mg, 500 mg, Oral, Q8H PRN, Von Bellis, MD   ondansetron  (ZOFRAN ) tablet 4 mg, 4 mg, Oral, Q6H PRN **OR** ondansetron  (ZOFRAN ) injection 4 mg, 4 mg, Intravenous, Q6H PRN, Laurita Cort DASEN, MD   pantoprazole  (PROTONIX ) EC  tablet 40 mg, 40 mg, Oral, Daily, Laurita, Ping T, MD, 40 mg at 12/12/23 1043   pneumococcal 20-valent conjugate vaccine (PREVNAR 20) injection 0.5 mL, 0.5 mL, Intramuscular, Tomorrow-1000, Mansy, Jan A, MD    ALLERGIES   Penicillins     REVIEW OF SYSTEMS    Review of Systems:  Gen:  Denies  fever, sweats, chills weigh loss  HEENT: Denies blurred vision, double vision, ear pain, eye pain, hearing loss, nose bleeds, sore throat Cardiac:  No dizziness, chest pain or heaviness, chest tightness,edema Resp:   reports dyspnea chronically  Gi: Denies swallowing difficulty, stomach pain, nausea or vomiting, diarrhea, constipation, bowel incontinence Gu:  Denies bladder incontinence, burning urine Ext:   Denies Joint pain, stiffness or swelling Skin: Denies  skin rash, easy bruising or bleeding or hives Endoc:  Denies polyuria, polydipsia , polyphagia or weight change Psych:   Denies depression, insomnia or hallucinations   Other:  All other systems negative   VS: BP (!) 134/57 (BP Location: Right Arm) Comment: Nurse notified  Pulse 65   Temp 98 F (36.7 C) (Oral)   Resp 16   Ht 5' 5 (1.651 m)   Wt 78 kg   SpO2 100%   BMI 28.62 kg/m      PHYSICAL EXAM    GENERAL:NAD, no fevers, chills, no weakness no fatigue HEAD: Normocephalic, atraumatic.  EYES: Pupils equal, round, reactive to light. Extraocular muscles intact. No scleral icterus.  MOUTH: Moist mucosal membrane. Dentition intact. No abscess noted.  EAR, NOSE, THROAT: Clear without exudates. No external lesions.  NECK: Supple. No thyromegaly. No nodules. No JVD.  PULMONARY: decreased breath sounds with mild rhonchi worse at bases bilaterally.  CARDIOVASCULAR: S1 and S2. Regular rate and rhythm. No murmurs, rubs, or gallops. No edema. Pedal pulses 2+ bilaterally.  GASTROINTESTINAL: Soft, nontender, nondistended. No masses. Positive bowel sounds. No hepatosplenomegaly.  MUSCULOSKELETAL: No swelling, clubbing, or edema.  Range of motion full in all extremities.  NEUROLOGIC: Cranial nerves II through XII are intact. No gross focal neurological deficits. Sensation intact. Reflexes intact.  SKIN: No ulceration, lesions, rashes, or cyanosis. Skin warm and dry. Turgor intact.  PSYCHIATRIC: Mood, affect within normal limits. The patient is awake, alert and oriented x 3. Insight, judgment intact.       IMAGING   @IMAGES @   Narrative & Impression CLINICAL DATA:  Complication from pneumonia.  Fall.   EXAM: CT CHEST WITHOUT CONTRAST   TECHNIQUE: Multidetector CT imaging of the chest was performed following the standard protocol without IV contrast.   RADIATION DOSE REDUCTION: This exam was performed according to the departmental dose-optimization program which includes automated exposure control, adjustment  of the mA and/or kV according to patient size and/or use of iterative reconstruction technique.   COMPARISON:  Chest x-ray same day.  CT chest 12/18/2019.   FINDINGS: Cardiovascular: The heart is moderately enlarged. There is no pericardial effusion. Aorta is normal in size. There are atherosclerotic calcifications of the aorta.   Mediastinum/Nodes: No enlarged mediastinal or axillary lymph nodes. Thyroid  gland, trachea, and esophagus demonstrate no significant findings.   Lungs/Pleura: There is a new slightly spiculated nodule in the right upper lobe anteriorly measuring 6 x 8 mm image 4/61. Focal ground-glass nodular opacity in the left upper lobe measures 1.7 x 1.2 cm and has minimally increased in size and density peripherally. There is a new ground-glass nodule in the left upper lobe measuring 5 mm image 4/63. There is no lung consolidation, pleural effusion or pneumothorax. There is a calcified granuloma in the right upper lobe.   Upper Abdomen: Hepatic cysts are unchanged. Cystic area in the right kidney is partially imaged.   Musculoskeletal: No chest wall mass or suspicious bone  lesions identified.   IMPRESSION: 1. New slightly spiculated nodule in the right upper lobe measuring 6 x 8 mm. Malignancy not excluded. 2. Focal ground-glass nodular opacity in the left upper lobe has minimally increased in size and density peripherally. Slow growing malignancy can not be excluded. Consider one of the following in 3 months for both low-risk and high-risk individuals: (a) repeat chest CT, (b) follow-up PET-CT, or (c) tissue sampling. This recommendation follows the consensus statement: Guidelines for Management of Incidental Pulmonary Nodules Detected on CT Images: From the Fleischner Society 2017; Radiology 2017; 284:228-243. 3. New ground-glass nodule in the left upper lobe measuring 5 mm, indeterminate. 4. Stable cardiomegaly. 5. Aortic atherosclerosis.   Aortic Atherosclerosis (ICD10-I70.0).     Electronically Signed   By: Greig Pique M.D.   On: 12/10/2023 18:45     Result History ASSESSMENT/PLAN   Left lower lobe pneumonia      Improved and is able to treat with outpatient antibiotics     - currently on zithromax  and rocephin  IV    - can transition to PO augmentin 875/125 bid x 5 more day    Left upper lobe ground glass nodule and right upper lobe semisolid nodule     - patient would need lung nodule biopsy and fiducial marker placement for treatment     - will discuss with patient regarding biopsy inpatient vs waiting until outpatient        Thank you for allowing me to participate in the care of this patient.   Patient/Family are satisfied with care plan and all questions have been answered.    Provider disclosure: Patient with at least one acute or chronic illness or injury that poses a threat to life or bodily function and is being managed actively during this encounter.  All of the below services have been performed independently by signing provider:  review of prior documentation from internal and or external health records.  Review of  previous and current lab results.  Interview and comprehensive assessment during patient visit today. Review of current and previous chest radiographs/CT scans. Discussion of management and test interpretation with health care team and patient/family.   This document was prepared using Dragon voice recognition software and may include unintentional dictation errors.     Carleah Yablonski, M.D.  Division of Pulmonary & Critical Care Medicine

## 2023-12-12 NOTE — Progress Notes (Signed)
 [facility] Progress Note  Patient: Sherry [name]    FMW:[id number]  DOA: 12/10/2023     Date of Service: the patient was seen and examined on 12/12/2023  Chief Complaint  Patient presents with   Fall   Brief hospital course: Sherry [name] is a 89 y.o. female with medical history significant of HTN, IIDM, HLD, hypothyroidism, CKD stage IIIb, presented with worsening of productive cough, weakness and fall.   Symptoms started about 3 weeks ago, patient started to develop productive cough with whitish phlegm, episode chills but no fever.  No chest pains.  She has been taking OTC medications with some relief.  She denies any cough or choking after eat or drink.  But she does complain of feeling  something in the throat and feeling sore while swallowing solid food.  She became so weak over the weekend and sustained a fall this morning, feeling so weak she could not stand up on her own.  Denied any LOC, no head or neck injury denied any abdominal pain no urinary symptoms..   ED Course: Temperature 98.1 blood pressure 150/95 oxygen 90% on room air.  Chest x-ray showed right lower lobe infiltrates versus atelectasis.  Blood work showed WBC 8.7 hemoglobin 10.0 BUN 22 creatinine 1.2 bicarb 25K 4.1 calcium 11.4 albumin 3.6 glucose 134.  CT head and neck negative for fracture or dislocation.  UA negative for UTI   Patient was given ceftriaxone  and doxycycline  in the ED.   Patient was admitted to the hospital and started on empiric IV antibiotics for pneumonia pending further evaluation.     Further hospital course and management as outlined below.   Assessment and Plan:  Acute ambulation impairment Generalized weakness Secondary to pneumonia most likely. CT head and C-spine were negative for acute findings.  CK level within normal limits. -PT evaluation -Fall precautions   CAP, bacterial Cough - 4 months -Continue ceftriaxone  and azithromycin  -Bronchodilators, Robitussin and  Tessalon  -Add Tussionex, above not helping per pt -CT chest without contrast was obtained on admission to further characterize pneumonia, shows new suspicious nodule as below.   New spiculated pulmonary nodule Concerning in setting of recent wt loss, cough of 4 months, hypercalcemia.   Hx of pulmonary nodules  Radiology recommendations for follow up: Consider one of the following in 3 months for both low-risk and high-risk individuals: (a) repeat chest CT, (b) follow-up PET-CT, or (c) tissue sampling  --Consult Pulmonology for input 9/24 plan for bronchoscopy on Friday   Dysphagia - Appears to be at laryngeal-upper esophageal level, will consult speech therapist.   Hypercalcemia - etiology to be determined, but based on CT chest finding on new spiculated nodule, concerning for malignancy. Ca 11.4 >> 11.0>>10.5 this AM -IV hydration  -Monitor BMP   Unintentional weight loss - pt reports 30# wt loss in recent 4 months, she attributes poor appetite, swallowing issues. Given CT chest    CKD stage IIIb Pt clinically volume depleted on admission., Renal function stable Cr 1.21 >> 1.16>1.4 today - Monitor BMP   HTN - Stable, continue atenolol  and losartan    IIDM -sliding scale novolog    Vitamin B12 level 362, goal is >400, started vitamin B12 oral supplement   Body mass index is 28.62 kg/m.  Interventions:  Diet: Carb modified diet DVT Prophylaxis: Subcutaneous Lovenox    Advance goals of care discussion: Full code  Family Communication: family was not present at bedside, at the time of interview.  The pt provided permission to discuss medical  plan with the family. Opportunity was given to ask question and all questions were answered satisfactorily.   Disposition:  Pt is from home, admitted with pneumonia, pulmonary nodule, needs bronchoscopy as per pulmonary, planned for bronc on 9/26, which precludes a safe discharge. Discharge to SNF, when stable.  Subjective: No  significant events overnight, patient was complaining of headache mostly in the backside and neck pain with bilateral ear ringing, mild productive cough.  No any other complaints.  Physical Exam: General: NAD, lying comfortably Appear in no distress, affect appropriate Eyes: PERRLA ENT: Oral Mucosa Clear, moist  Neck: no JVD,  Cardiovascular: S1 and S2 Present, no Murmur,  Respiratory: good respiratory effort, Bilateral Air entry equal and Decreased, no Crackles, no wheezes Abdomen: Bowel Sound present, Soft and no tenderness,  Skin: no rashes Extremities: no Pedal edema, no calf tenderness Neurologic: without any new focal findings Gait not checked due to patient safety concerns  Vitals:   12/12/23 0433 12/12/23 0747 12/12/23 0958 12/12/23 1013  BP: (!) 115/48 (!) 123/45 (!) 110/94 (!) 134/57  Pulse: 61 66 67 65  Resp: 16     Temp: 97.9 F (36.6 C) 98 F (36.7 C)    TempSrc: Oral Oral    SpO2: 98% 99% 100% 100%  Weight:      Height:        Intake/Output Summary (Last 24 hours) at 12/12/2023 1456 Last data filed at 12/12/2023 1300 Gross per 24 hour  Intake 1020 ml  Output 798 ml  Net 222 ml   Filed Weights   12/10/23 1216  Weight: 78 kg    Data Reviewed: I have personally reviewed and interpreted daily labs, tele strips, imagings as discussed above. I reviewed all nursing notes, pharmacy notes, vitals, pertinent old records I have discussed plan of care as described above with RN and patient/family.  CBC: Recent Labs  Lab 12/10/23 1245 12/11/23 0519 12/12/23 0543  WBC 8.7 10.5 7.8  NEUTROABS 6.3  --   --   HGB 10.0* 10.1* 9.0*  HCT 32.5* 31.8* 28.6*  MCV 88.6 87.8 88.8  PLT 289 291 268   Basic Metabolic Panel: Recent Labs  Lab 12/10/23 1245 12/11/23 0519 12/12/23 0543 12/12/23 0822  NA 138 139 137  --   K 4.1 4.5 4.3  --   CL 105 107 107  --   CO2 25 25 23   --   GLUCOSE 134* 152* 138*  --   BUN 22 23 22   --   CREATININE 1.21* 1.16* 1.40*  --    CALCIUM 11.4* 11.0* 10.5*  --   MG  --   --   --  1.7  PHOS  --   --  2.6  --     Studies: No results found.  Scheduled Meds:  atenolol   25 mg Oral Daily   azithromycin   500 mg Oral Daily   enoxaparin  (LOVENOX ) injection  40 mg Subcutaneous Q24H   feeding supplement  237 mL Oral BID BM   guaiFENesin   600 mg Oral BID   Influenza vac split trivalent PF  0.5 mL Intramuscular Tomorrow-1000   insulin  aspart  0-9 Units Subcutaneous TID WC   levothyroxine   25 mcg Oral QAC breakfast   losartan   50 mg Oral Daily   pantoprazole   40 mg Oral Daily   pneumococcal 20-valent conjugate vaccine  0.5 mL Intramuscular Tomorrow-1000   Continuous Infusions:  sodium chloride  75 mL/hr at 12/12/23 1154   cefTRIAXone  (ROCEPHIN )  IV 2  g (12/12/23 1055)   PRN Meds: acetaminophen  **OR** acetaminophen , albuterol , benzonatate , chlorpheniramine-HYDROcodone, methocarbamol , ondansetron  **OR** ondansetron  (ZOFRAN ) IV  Time spent: 55 minutes  Author: ELVAN SOR. MD [facility] 12/12/2023 2:56 PM  To reach On-call, see care teams to locate the attending and reach out to them via [email or web address]. If 7PM-7AM, please contact night-coverage If you still have difficulty reaching the attending provider, please page the [facility] (Director on Call) for [facility] on amion for assistance.

## 2023-12-12 NOTE — Progress Notes (Signed)
 Mobility Specialist Progress Note:    12/12/23 1530  Mobility  Activity Ambulated with assistance;Pivoted/transferred from chair to bed  Level of Assistance Contact guard assist, steadying assist  Assistive Device Front wheel walker  Distance Ambulated (ft) 75 ft  Range of Motion/Exercises Active;All extremities  Activity Response Tolerated well  Mobility visit 1 Mobility  Mobility Specialist Start Time (ACUTE ONLY) 1447  Mobility Specialist Stop Time (ACUTE ONLY) 1530  Mobility Specialist Time Calculation (min) (ACUTE ONLY) 43 min   Pt received in chair, agreeable to mobility. Required CGA to stand and ambulate with RW. Tolerated well, c/o fatigue near EOS. Returned to room, left supine. Nurse in room, all needs met.  Sherrilee Ditty Mobility Specialist Please contact via Special educational needs teacher or  Rehab office at [phone]

## 2023-12-12 NOTE — Evaluation (Signed)
 Occupational Therapy Evaluation Patient Details Name: Sherry [name] MRN: [id number] DOB: 1934-04-27 Today's Date: 12/12/2023   History of Present Illness   Sherry [name] is a 89 y.o. female with medical history significant of HTN, IIDM, HLD, hypothyroidism, CKD stage IIIb, presented with worsening of productive cough, weakness and fall.   Clinical Impressions Ms Hucker was seen for OT evaluation this date. Prior to hospital admission, pt was MOD I using rollator. Pt lives alone in 2 level home, lives on main level. Pt currently requires SUPERVISION + RW for toilet t/f and standing grooming tasks. Educated on ECS, DME recs, and d/c recs, all education complete, will sign off. Upon hospital discharge, recommend OT follow up.    If plan is discharge home, recommend the following:   Help with stairs or ramp for entrance     Functional Status Assessment   Patient has had a recent decline in their functional status and demonstrates the ability to make significant improvements in function in a reasonable and predictable amount of time.     Equipment Recommendations   BSC/3in1     Recommendations for Other Services         Precautions/Restrictions   Precautions Precautions: Fall Recall of Precautions/Restrictions: Intact Restrictions Weight Bearing Restrictions Per Provider Order: No     Mobility Bed Mobility               General bed mobility comments: not tested    Transfers Overall transfer level: Needs assistance Equipment used: Rolling walker (2 wheels) Transfers: Sit to/from Stand Sit to Stand: Supervision                  Balance Overall balance assessment: Needs assistance Sitting-balance support: Feet supported Sitting balance-Leahy Scale: Good     Standing balance support: No upper extremity supported, During functional activity Standing balance-Leahy Scale: Good                             ADL either  performed or assessed with clinical judgement   ADL Overall ADL's : Needs assistance/impaired                                       General ADL Comments: SUPERVISION + RW for toilet t/f and standing grooming tasks      Pertinent Vitals/Pain Pain Assessment Pain Assessment: No/denies pain     Extremity/Trunk Assessment Upper Extremity Assessment Upper Extremity Assessment: Overall WFL for tasks assessed   Lower Extremity Assessment Lower Extremity Assessment: Generalized weakness       Communication Communication Communication: No apparent difficulties   Cognition Arousal: Alert Behavior During Therapy: WFL for tasks assessed/performed Cognition: No apparent impairments                               Following commands: Impaired       Cueing  General Comments   Cueing Techniques: Verbal cues;Visual cues      Exercises     Shoulder Instructions      Home Living Family/patient expects to be discharged to:: Private residence Living Arrangements: Alone Available Help at Discharge: Family;Available PRN/intermittently Type of Home: House Home Access: Stairs to enter [employer] of Steps: 2-3 Entrance Stairs-Rails: None Home Layout: Two level;Able to live on main level with bedroom/bathroom;Laundry or work  area in basement Alternate Level Stairs-Number of Steps: 17 Alternate Level Stairs-Rails: Right;Left;Can reach both           Home Equipment: Rolling Walker (2 wheels);Rollator (4 wheels);Cane - single point;BSC/3in1;Grab bars - toilet;Grab bars - tub/shower          Prior Functioning/Environment Prior Level of Function : Independent/Modified Independent             Mobility Comments: Uses rollator for ambulation. Reports frequent seated breaks on rollator when amb. ADLs Comments: independent    OT Problem List: Decreased activity tolerance   OT Treatment/Interventions:        OT Goals(Current goals  can be found in the care plan section)   Acute Rehab OT Goals Patient Stated Goal: to go home OT Goal Formulation: With patient Time For Goal Achievement: 12/12/23 Potential to Achieve Goals: Good   OT Frequency:       Co-evaluation              AM-PAC OT 6 Clicks Daily Activity     Outcome Measure Help from another person eating meals?: None Help from another person taking care of personal grooming?: None Help from another person toileting, which includes using toliet, bedpan, or urinal?: A Little Help from another person bathing (including washing, rinsing, drying)?: A Little Help from another person to put on and taking off regular upper body clothing?: None Help from another person to put on and taking off regular lower body clothing?: A Little 6 Click Score: 21   End of Session Equipment Utilized During Treatment: Rolling walker (2 wheels)  Activity Tolerance: Patient tolerated treatment well Patient left:  (in bathroom with nursing student)  OT Visit Diagnosis: Other abnormalities of gait and mobility (R26.89);Muscle weakness (generalized) (M62.81)                Time: 9087-9064 OT Time Calculation (min): 23 min Charges:  OT General Charges $OT Visit: 1 Visit OT Evaluation $OT Eval Low Complexity: 1 Low OT Treatments $Self Care/Home Management : 8-22 mins  Elston Slot, M.S. OTR/L  12/12/23, 11:25 AM  ascom [phone]

## 2023-12-13 DIAGNOSIS — J189 Pneumonia, unspecified organism: Secondary | ICD-10-CM | POA: Diagnosis not present

## 2023-12-13 DIAGNOSIS — J9621 Acute and chronic respiratory failure with hypoxia: Secondary | ICD-10-CM | POA: Diagnosis not present

## 2023-12-13 LAB — CBC
HCT: 29.9 % — ABNORMAL LOW (ref 36.0–46.0)
Hemoglobin: 9.4 g/dL — ABNORMAL LOW (ref 12.0–15.0)
MCH: 27.7 pg (ref 26.0–34.0)
MCHC: 31.4 g/dL (ref 30.0–36.0)
MCV: 88.2 fL (ref 80.0–100.0)
Platelets: 283 K/uL (ref 150–400)
RBC: 3.39 MIL/uL — ABNORMAL LOW (ref 3.87–5.11)
RDW: 13.6 % (ref 11.5–15.5)
WBC: 6.6 K/uL (ref 4.0–10.5)
nRBC: 0 % (ref 0.0–0.2)

## 2023-12-13 LAB — BASIC METABOLIC PANEL WITH GFR
Anion gap: 5 (ref 5–15)
BUN: 19 mg/dL (ref 8–23)
CO2: 26 mmol/L (ref 22–32)
Calcium: 10.6 mg/dL — ABNORMAL HIGH (ref 8.9–10.3)
Chloride: 107 mmol/L (ref 98–111)
Creatinine, Ser: 1.18 mg/dL — ABNORMAL HIGH (ref 0.44–1.00)
GFR, Estimated: 45 mL/min — ABNORMAL LOW (ref >60)
Glucose, Bld: 112 mg/dL — ABNORMAL HIGH (ref 70–99)
Potassium: 4.4 mmol/L (ref 3.5–5.1)
Sodium: 138 mmol/L (ref 135–145)

## 2023-12-13 LAB — PHOSPHORUS: Phosphorus: 2.7 mg/dL (ref 2.5–4.6)

## 2023-12-13 LAB — GLUCOSE, CAPILLARY
Glucose-Capillary: 128 mg/dL — ABNORMAL HIGH (ref 70–99)
Glucose-Capillary: 188 mg/dL — ABNORMAL HIGH (ref 70–99)
Glucose-Capillary: 151 mg/dL — ABNORMAL HIGH (ref 70–99)
Glucose-Capillary: 162 mg/dL — ABNORMAL HIGH (ref 70–99)

## 2023-12-13 LAB — LEGIONELLA PNEUMOPHILA SEROGP 1 UR AG: L. pneumophila Serogp 1 Ur Ag: NEGATIVE

## 2023-12-13 LAB — MAGNESIUM: Magnesium: 1.8 mg/dL (ref 1.7–2.4)

## 2023-12-13 MED ORDER — OXYCODONE HCL 5 MG PO TABS
5.0000 mg | ORAL_TABLET | Freq: Once | ORAL | Status: AC
  Administered 2023-12-13: 5 mg via ORAL
  Filled 2023-12-13: qty 1

## 2023-12-13 MED ORDER — ACETAMINOPHEN 500 MG PO TABS
500.0000 mg | ORAL_TABLET | Freq: Three times a day (TID) | ORAL | Status: DC
  Administered 2023-12-13 – 2023-12-18 (×14): 500 mg via ORAL
  Filled 2023-12-13 (×15): qty 1

## 2023-12-13 MED ORDER — DICLOFENAC SODIUM 1 % EX GEL
2.0000 g | Freq: Three times a day (TID) | CUTANEOUS | Status: DC
  Administered 2023-12-13 – 2023-12-18 (×15): 2 g via TOPICAL
  Filled 2023-12-13: qty 100

## 2023-12-13 MED ORDER — SODIUM CHLORIDE 0.9 % IV SOLN: INTRAVENOUS | Status: DC

## 2023-12-13 MED ORDER — OXYCODONE HCL 5 MG PO TABS
5.0000 mg | ORAL_TABLET | Freq: Four times a day (QID) | ORAL | Status: DC | PRN
Start: 2023-12-13 — End: 2023-12-16

## 2023-12-13 MED ORDER — METHOCARBAMOL 500 MG PO TABS
500.0000 mg | ORAL_TABLET | Freq: Three times a day (TID) | ORAL | Status: DC
Start: 2023-12-13 — End: 2023-12-19
  Administered 2023-12-13 – 2023-12-18 (×15): 500 mg via ORAL
  Filled 2023-12-13 (×15): qty 1

## 2023-12-13 NOTE — Progress Notes (Signed)
 PULMONOLOGY         Date: 12/13/2023,   MRN# [id number] Sherry [name] 19-Aug-1934     AdmissionWeight: 78 kg                 CurrentWeight: 78 kg  Referring provider: Dr Kandis   CHIEF COMPLAINT:   Acute on chronic hypoxemic respiratory failure   HISTORY OF PRESENT ILLNESS   Sherry [name] is a 89 y.o. female with medical history significant of HTN, IIDM, HLD, hypothyroidism, CKD stage IIIb, presented with worsening of productive cough, weakness and fall. She also has chronic cough and lung nodules.  She has dysphagia and expresses aspiration concerns. Her bloodwork was fairly stable compared to recent with CKD and no alarming findings.  She had chronic LLL atelectasis.  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    12/13/23- patient NPO tonight at midnight for bronchoscopy tomorrow at noon.  She is doing well clninically and had no overnight events.   PAST MEDICAL HISTORY   Past Medical History:  Diagnosis Date   Allergy    Diabetes mellitus without complication (HCC)    Renal insufficiency    Thyroid  disease      SURGICAL HISTORY   Past Surgical History:  Procedure Laterality Date   CHOLECYSTECTOMY       FAMILY HISTORY   Family History  Problem Relation Age of Onset   Diabetes Mother    Hypertension Mother    Hypertension Father    Cancer Brother      SOCIAL HISTORY   Social History   Tobacco Use   Smoking status: Former    Current packs/day: 0.00    Types: Cigarettes    Quit date: 2004    Years since quitting: 21.7   Smokeless tobacco: Never  Vaping Use   Vaping status: Never Used  Substance Use Topics   Alcohol use: Not Currently   Drug use: Not Currently     MEDICATIONS    Home Medication:    Current Medication:  Current Facility-Administered Medications:    0.9 %  sodium chloride  infusion, , Intravenous, Continuous, Von Bellis, MD, Last Rate: 75 mL/hr at 12/12/23 1154, New Bag at 12/12/23 1154    acetaminophen  (TYLENOL ) tablet 650 mg, 650 mg, Oral, Q6H PRN **OR** acetaminophen  (TYLENOL ) suppository 650 mg, 650 mg, Rectal, Q6H PRN, Laurita Manor T, MD   albuterol  (PROVENTIL ) (2.5 MG/3ML) 0.083% nebulizer solution 2.5 mg, 2.5 mg, Inhalation, Q6H PRN, Laurita Manor T, MD   atenolol  (TENORMIN ) tablet 25 mg, 25 mg, Oral, Daily, Laurita, Ping T, MD, 25 mg at 12/12/23 1041   azithromycin  (ZITHROMAX ) tablet 500 mg, 500 mg, Oral, Daily, Laurita, Ping T, MD, 500 mg at 12/12/23 1042   benzonatate  (TESSALON ) capsule 100 mg, 100 mg, Oral, TID PRN, Laurita Manor T, MD   cefTRIAXone  (ROCEPHIN ) 2 g in sodium chloride  0.9 % 100 mL IVPB, 2 g, Intravenous, Q24H, Laurita Manor T, MD, Last Rate: 200 mL/hr at 12/12/23 1055, 2 g at 12/12/23 1055   chlorpheniramine-HYDROcodone (TUSSIONEX) 10-8 MG/5ML suspension 5 mL, 5 mL, Oral, Q12H PRN, Fausto Sor A, DO   cyanocobalamin  (VITAMIN B12) tablet 500 mcg, 500 mcg, Oral, Daily, Von Bellis, MD, 500 mcg at 12/12/23 1538   enoxaparin  (LOVENOX ) injection 40 mg, 40 mg, Subcutaneous, Q24H, Zhang, Ping T, MD, 40 mg at 12/12/23 1539   feeding supplement (ENSURE PLUS HIGH PROTEIN) liquid 237 mL, 237 mL, Oral, BID BM, Mansy, Jan A,  MD, 237 mL at 12/12/23 1044   guaiFENesin  (MUCINEX ) 12 hr tablet 600 mg, 600 mg, Oral, BID, Laurita Manor T, MD, 600 mg at 12/12/23 2206   Influenza vac split trivalent PF (FLUZONE HIGH-DOSE) injection 0.5 mL, 0.5 mL, Intramuscular, Tomorrow-1000, Mansy, Jan A, MD   insulin  aspart (novoLOG ) injection 0-9 Units, 0-9 Units, Subcutaneous, TID WC, Laurita Manor DASEN, MD, 2 Units at 12/12/23 1724   levothyroxine  (SYNTHROID ) tablet 25 mcg, 25 mcg, Oral, QAC breakfast, Laurita Manor T, MD, 25 mcg at 12/13/23 9281   losartan  (COZAAR ) tablet 50 mg, 50 mg, Oral, Daily, Laurita Manor T, MD, 50 mg at 12/12/23 1043   methocarbamol  (ROBAXIN ) tablet 500 mg, 500 mg, Oral, Q8H PRN, Von Bellis, MD   ondansetron  (ZOFRAN ) tablet 4 mg, 4 mg, Oral, Q6H PRN **OR** ondansetron  (ZOFRAN )  injection 4 mg, 4 mg, Intravenous, Q6H PRN, Laurita Manor DASEN, MD   pantoprazole  (PROTONIX ) EC tablet 40 mg, 40 mg, Oral, Daily, Laurita, Ping T, MD, 40 mg at 12/12/23 1043   pneumococcal 20-valent conjugate vaccine (PREVNAR 20) injection 0.5 mL, 0.5 mL, Intramuscular, Tomorrow-1000, Mansy, Jan A, MD    ALLERGIES   Penicillins     REVIEW OF SYSTEMS    Review of Systems:  Gen:  Denies  fever, sweats, chills weigh loss  HEENT: Denies blurred vision, double vision, ear pain, eye pain, hearing loss, nose bleeds, sore throat Cardiac:  No dizziness, chest pain or heaviness, chest tightness,edema Resp:   reports dyspnea chronically  Gi: Denies swallowing difficulty, stomach pain, nausea or vomiting, diarrhea, constipation, bowel incontinence Gu:  Denies bladder incontinence, burning urine Ext:   Denies Joint pain, stiffness or swelling Skin: Denies  skin rash, easy bruising or bleeding or hives Endoc:  Denies polyuria, polydipsia , polyphagia or weight change Psych:   Denies depression, insomnia or hallucinations   Other:  All other systems negative   VS: BP (!) 137/48 (BP Location: Right Arm)   Pulse (!) 59   Temp 98 F (36.7 C) (Oral)   Resp 18   Ht 5' 5 (1.651 m)   Wt 78 kg   SpO2 99%   BMI 28.62 kg/m      PHYSICAL EXAM    GENERAL:NAD, no fevers, chills, no weakness no fatigue HEAD: Normocephalic, atraumatic.  EYES: Pupils equal, round, reactive to light. Extraocular muscles intact. No scleral icterus.  MOUTH: Moist mucosal membrane. Dentition intact. No abscess noted.  EAR, NOSE, THROAT: Clear without exudates. No external lesions.  NECK: Supple. No thyromegaly. No nodules. No JVD.  PULMONARY: decreased breath sounds with mild rhonchi worse at bases bilaterally.  CARDIOVASCULAR: S1 and S2. Regular rate and rhythm. No murmurs, rubs, or gallops. No edema. Pedal pulses 2+ bilaterally.  GASTROINTESTINAL: Soft, nontender, nondistended. No masses. Positive bowel sounds. No  hepatosplenomegaly.  MUSCULOSKELETAL: No swelling, clubbing, or edema. Range of motion full in all extremities.  NEUROLOGIC: Cranial nerves II through XII are intact. No gross focal neurological deficits. Sensation intact. Reflexes intact.  SKIN: No ulceration, lesions, rashes, or cyanosis. Skin warm and dry. Turgor intact.  PSYCHIATRIC: Mood, affect within normal limits. The patient is awake, alert and oriented x 3. Insight, judgment intact.       IMAGING   @IMAGES @   Narrative & Impression CLINICAL DATA:  Complication from pneumonia.  Fall.   EXAM: CT CHEST WITHOUT CONTRAST   TECHNIQUE: Multidetector CT imaging of the chest was performed following the standard protocol without IV contrast.   RADIATION DOSE REDUCTION: This  exam was performed according to the departmental dose-optimization program which includes automated exposure control, adjustment of the mA and/or kV according to patient size and/or use of iterative reconstruction technique.   COMPARISON:  Chest x-ray same day.  CT chest 12/18/2019.   FINDINGS: Cardiovascular: The heart is moderately enlarged. There is no pericardial effusion. Aorta is normal in size. There are atherosclerotic calcifications of the aorta.   Mediastinum/Nodes: No enlarged mediastinal or axillary lymph nodes. Thyroid  gland, trachea, and esophagus demonstrate no significant findings.   Lungs/Pleura: There is a new slightly spiculated nodule in the right upper lobe anteriorly measuring 6 x 8 mm image 4/61. Focal ground-glass nodular opacity in the left upper lobe measures 1.7 x 1.2 cm and has minimally increased in size and density peripherally. There is a new ground-glass nodule in the left upper lobe measuring 5 mm image 4/63. There is no lung consolidation, pleural effusion or pneumothorax. There is a calcified granuloma in the right upper lobe.   Upper Abdomen: Hepatic cysts are unchanged. Cystic area in the right kidney is  partially imaged.   Musculoskeletal: No chest wall mass or suspicious bone lesions identified.   IMPRESSION: 1. New slightly spiculated nodule in the right upper lobe measuring 6 x 8 mm. Malignancy not excluded. 2. Focal ground-glass nodular opacity in the left upper lobe has minimally increased in size and density peripherally. Slow growing malignancy can not be excluded. Consider one of the following in 3 months for both low-risk and high-risk individuals: (a) repeat chest CT, (b) follow-up PET-CT, or (c) tissue sampling. This recommendation follows the consensus statement: Guidelines for Management of Incidental Pulmonary Nodules Detected on CT Images: From the Fleischner Society 2017; Radiology 2017; 284:228-243. 3. New ground-glass nodule in the left upper lobe measuring 5 mm, indeterminate. 4. Stable cardiomegaly. 5. Aortic atherosclerosis.   Aortic Atherosclerosis (ICD10-I70.0).     Electronically Signed   By: Greig Pique M.D.   On: 12/10/2023 18:45     Result History ASSESSMENT/PLAN   Left lower lobe pneumonia      Improved and is able to treat with outpatient antibiotics     - currently on zithromax  and rocephin  IV    - can transition to PO augmentin 875/125 bid x 5 more day    Left upper lobe ground glass nodule and right upper lobe semisolid nodule     - lung biopsy via bronchoscopy Friday 12/14/23 Noon     - off lovenox  until after procedure    - NPO post midnight     -electronic consent filled out in EPIC EMR        Thank you for allowing me to participate in the care of this patient.   Patient/Family are satisfied with care plan and all questions have been answered.    Provider disclosure: Patient with at least one acute or chronic illness or injury that poses a threat to life or bodily function and is being managed actively during this encounter.  All of the below services have been performed independently by signing provider:  review of prior  documentation from internal and or external health records.  Review of previous and current lab results.  Interview and comprehensive assessment during patient visit today. Review of current and previous chest radiographs/CT scans. Discussion of management and test interpretation with health care team and patient/family.   This document was prepared using Dragon voice recognition software and may include unintentional dictation errors.     Lakedra Washington, M.D.  Division  of Pulmonary & Critical Care Medicine

## 2023-12-13 NOTE — Progress Notes (Signed)
 [facility] Progress Note  Patient: Sherry [name]    FMW:[id number]  DOA: 12/10/2023     Date of Service: the patient was seen and examined on 12/13/2023  Chief Complaint  Patient presents with   Fall   Brief hospital course: Sherry [name] is a 89 y.o. female with medical history significant of HTN, IIDM, HLD, hypothyroidism, CKD stage IIIb, presented with worsening of productive cough, weakness and fall.   Symptoms started about 3 weeks ago, patient started to develop productive cough with whitish phlegm, episode chills but no fever.  No chest pains.  She has been taking OTC medications with some relief.  She denies any cough or choking after eat or drink.  But she does complain of feeling  something in the throat and feeling sore while swallowing solid food.  She became so weak over the weekend and sustained a fall this morning, feeling so weak she could not stand up on her own.  Denied any LOC, no head or neck injury denied any abdominal pain no urinary symptoms..   ED Course: Temperature 98.1 blood pressure 150/95 oxygen 90% on room air.  Chest x-ray showed right lower lobe infiltrates versus atelectasis.  Blood work showed WBC 8.7 hemoglobin 10.0 BUN 22 creatinine 1.2 bicarb 25K 4.1 calcium 11.4 albumin 3.6 glucose 134.  CT head and neck negative for fracture or dislocation.  UA negative for UTI   Patient was given ceftriaxone  and doxycycline  in the ED.   Patient was admitted to the hospital and started on empiric IV antibiotics for pneumonia pending further evaluation.     Further hospital course and management as outlined below.   Assessment and Plan:  Acute ambulation impairment Generalized weakness Secondary to pneumonia most likely. CT head and C-spine were negative for acute findings.  CK level within normal limits. Neck pain and muscle spasm status post fall Started Tylenol  500 mg p.o. 3 times daily scheduled, Robaxin  40 mg p.o. 3 times daily scheduled and  oxycodone  as needed. -PT evaluation -Fall precautions   CAP, bacterial Cough - 4 months -Continue ceftriaxone  and azithromycin  -Bronchodilators, Robitussin and Tessalon  -Add Tussionex, above not helping per pt -CT chest without contrast was obtained on admission to further characterize pneumonia, shows new suspicious nodule as below.   New spiculated pulmonary nodule Concerning in setting of recent wt loss, cough of 4 months, hypercalcemia.   Hx of pulmonary nodules  Radiology recommendations for follow up: Consider one of the following in 3 months for both low-risk and high-risk individuals: (a) repeat chest CT, (b) follow-up PET-CT, or (c) tissue sampling  --Consult Pulmonology for input 9/25 plan for bronchoscopy on Friday   Dysphagia - Appears to be at laryngeal-upper esophageal level, will consult speech therapist.   Hypercalcemia - etiology to be determined, but based on CT chest finding on new spiculated nodule, concerning for malignancy. Ca 11.4 >> 11.0>>10.6 this AM - Continue IV fluid for hydration  -Monitor BMP   Unintentional weight loss - pt reports 30# wt loss in recent 4 months, she attributes poor appetite, swallowing issues. Given CT chest    CKD stage IIIb Pt clinically volume depleted on admission., Renal function stable Cr 1.21 >> 1.16>1.4 today - Monitor BMP   HTN - Stable, continue atenolol  and losartan    IIDM -sliding scale novolog    Vitamin B12 level 362, goal is >400, started vitamin B12 oral supplement   Body mass index is 28.62 kg/m.  Interventions:  Diet: Carb modified diet DVT Prophylaxis:  Subcutaneous Lovenox    Advance goals of care discussion: Full code  Family Communication: family was not present at bedside, at the time of interview.  The pt provided permission to discuss medical plan with the family. Opportunity was given to ask question and all questions were answered satisfactorily.   Disposition:  Pt is from home, admitted  with pneumonia, pulmonary nodule, needs bronchoscopy as per pulmonary, planned for bronc on 9/26, which precludes a safe discharge. Discharge to SNF, when stable.  Subjective: No significant events overnight.  Patient is still complaining of pain in the neck, having difficulty moving neck from side-to-side.  He has chronic cervical degenerative disc disease.  Which got worse after fall. Patient agreed for bronchoscopy tomorrow a.m.  Physical Exam: General: NAD, lying comfortably Appear in no distress, affect appropriate Eyes: PERRLA ENT: Oral Mucosa Clear, moist  Neck: no JVD,  Cardiovascular: S1 and S2 Present, no Murmur,  Respiratory: good respiratory effort, Bilateral Air entry equal and Decreased, no Crackles, no wheezes Abdomen: Bowel Sound present, Soft and no tenderness,  Skin: no rashes Extremities: no Pedal edema, no calf tenderness Neurologic: without any new focal findings Gait not checked due to patient safety concerns  Vitals:   12/12/23 1945 12/13/23 0344 12/13/23 0806 12/13/23 1603  BP: (!) 130/48 (!) 137/48 (!) 131/52 (!) 123/58  Pulse: 62 (!) 59 65 72  Resp: 20 18 20 18   Temp: (!) 97.4 F (36.3 C) 98 F (36.7 C) 98.2 F (36.8 C) 97.8 F (36.6 C)  TempSrc:  Oral Oral   SpO2: 100% 99% 100% 98%  Weight:      Height:        Intake/Output Summary (Last 24 hours) at 12/13/2023 1632 Last data filed at 12/13/2023 9076 Gross per 24 hour  Intake 2220.28 ml  Output 625 ml  Net 1595.28 ml   Filed Weights   12/10/23 1216  Weight: 78 kg    Data Reviewed: I have personally reviewed and interpreted daily labs, tele strips, imagings as discussed above. I reviewed all nursing notes, pharmacy notes, vitals, pertinent old records I have discussed plan of care as described above with RN and patient/family.  CBC: Recent Labs  Lab 12/10/23 1245 12/11/23 0519 12/12/23 0543 12/13/23 0415  WBC 8.7 10.5 7.8 6.6  NEUTROABS 6.3  --   --   --   HGB 10.0* 10.1* 9.0* 9.4*   HCT 32.5* 31.8* 28.6* 29.9*  MCV 88.6 87.8 88.8 88.2  PLT 289 291 268 283   Basic Metabolic Panel: Recent Labs  Lab 12/10/23 1245 12/11/23 0519 12/12/23 0543 12/12/23 0822 12/13/23 0415  NA 138 139 137  --  138  K 4.1 4.5 4.3  --  4.4  CL 105 107 107  --  107  CO2 25 25 23   --  26  GLUCOSE 134* 152* 138*  --  112*  BUN 22 23 22   --  19  CREATININE 1.21* 1.16* 1.40*  --  1.18*  CALCIUM 11.4* 11.0* 10.5*  --  10.6*  MG  --   --   --  1.7 1.8  PHOS  --   --  2.6  --  2.7    Studies: No results found.  Scheduled Meds:  atenolol   25 mg Oral Daily   azithromycin   500 mg Oral Daily   vitamin B-12  500 mcg Oral Daily   feeding supplement  237 mL Oral BID BM   guaiFENesin   600 mg Oral BID   Influenza  vac split trivalent PF  0.5 mL Intramuscular Tomorrow-1000   insulin  aspart  0-9 Units Subcutaneous TID WC   levothyroxine   25 mcg Oral QAC breakfast   losartan   50 mg Oral Daily   pantoprazole   40 mg Oral Daily   pneumococcal 20-valent conjugate vaccine  0.5 mL Intramuscular Tomorrow-1000   Continuous Infusions:  sodium chloride  75 mL/hr at 12/13/23 0923   cefTRIAXone  (ROCEPHIN )  IV 2 g (12/13/23 0829)   PRN Meds: acetaminophen  **OR** acetaminophen , albuterol , benzonatate , chlorpheniramine-HYDROcodone, methocarbamol , ondansetron  **OR** ondansetron  (ZOFRAN ) IV  Time spent: 55 minutes  Author: ELVAN SOR. MD [facility] 12/13/2023 4:32 PM  To reach On-call, see care teams to locate the attending and reach out to them via [email or web address]. If 7PM-7AM, please contact night-coverage If you still have difficulty reaching the attending provider, please page the [facility] (Director on Call) for [facility] on amion for assistance.

## 2023-12-13 NOTE — Progress Notes (Signed)
 Physical Therapy Treatment Patient Details Name: Sherry [name] MRN: [id number] DOB: [birth year] Today's Date: 12/13/2023   History of Present Illness Sherry [name] is a 89 y.o. female with medical history significant of HTN, IIDM, HLD, hypothyroidism, CKD stage IIIb, presented with worsening of productive cough, weakness and fall.    PT Comments  Pt received in bed, agreeable to PT session. Rollator brought to room (pt's baseline AD). Increased time and heavy reliance on bed rail to transition to EOB. No c/o dizziness or productive cough during session. Pt able to stand from bed with Supervision and complete gait training in hall with Rollator and good safety awareness. Pt appears to progressing back to functional baseline. Initial recs for HHPT remain appropriate.   If plan is discharge home, recommend the following: A little help with walking and/or transfers;A little help with bathing/dressing/bathroom;Assistance with cooking/housework;Help with stairs or ramp for entrance;Assist for transportation   Can travel by private vehicle        Equipment Recommendations  None recommended by PT    Recommendations for Other Services       Precautions / Restrictions Precautions Precautions: Fall Recall of Precautions/Restrictions: Intact Restrictions Weight Bearing Restrictions Per Provider Order: No     Mobility  Bed Mobility Overal bed mobility: Needs Assistance Bed Mobility: Supine to Sit     Supine to sit: Supervision, HOB elevated, Used rails     General bed mobility comments: definite use of railing to transition to sitting EOB    Transfers Overall transfer level: Needs assistance Equipment used: Rollator (4 wheels) Transfers: Sit to/from Stand Sit to Stand: Supervision           General transfer comment:  (Able to stand after 2nd attempt and heavy reliance on UE's to power up)    Ambulation/Gait Ambulation/Gait assistance: Supervision Gait Distance  (Feet): 100 Feet Assistive device: Rollator (4 wheels) Gait Pattern/deviations: Step-through pattern, Decreased dorsiflexion - right, Decreased dorsiflexion - left Gait velocity: decreased     General Gait Details: Pt demonstrates decreased DF on BLEs most likely due to chronic edema   Stairs             Wheelchair Mobility     Tilt Bed    Modified Rankin (Stroke Patients Only)       Balance Overall balance assessment: Needs assistance Sitting-balance support: Feet supported Sitting balance-Leahy Scale: Good     Standing balance support: Bilateral upper extremity supported, During functional activity, Reliant on assistive device for balance Standing balance-Leahy Scale: Fair Standing balance comment: Heavy reliance on Rollator for balance. No LOB noted.                            Communication Communication Communication: No apparent difficulties  Cognition Arousal: Alert Behavior During Therapy: WFL for tasks assessed/performed   PT - Cognitive impairments: No apparent impairments                       PT - Cognition Comments: pleasant and agreeable to PT session. A&Ox3 Following commands: Impaired Following commands impaired: Follows one step commands with increased time    Cueing Cueing Techniques: Verbal cues, Visual cues  Exercises Other Exercises Other Exercises:  (Pt educated on role of acute PT. Discussed current LOF and home set up. Reviewed LE circulation exercises with good understanding.) Other Exercises:  (Pt educated on benefits of airway clearance and mobility)    General Comments  Pertinent Vitals/Pain Pain Assessment Pain Assessment: No/denies pain    Home Living                          Prior Function            PT Goals (current goals can now be found in the care plan section) Acute Rehab PT Goals Patient Stated Goal: to go home Progress towards PT goals: Progressing toward goals     Frequency    Min 1X/week      PT Plan      Co-evaluation              AM-PAC PT 6 Clicks Mobility   Outcome Measure  Help needed turning from your back to your side while in a flat bed without using bedrails?: A Little Help needed moving from lying on your back to sitting on the side of a flat bed without using bedrails?: A Little Help needed moving to and from a bed to a chair (including a wheelchair)?: A Little Help needed standing up from a chair using your arms (e.g., wheelchair or bedside chair)?: A Little Help needed to walk in hospital room?: A Little Help needed climbing 3-5 steps with a railing? : A Lot 6 Click Score: 17    End of Session Equipment Utilized During Treatment: Gait belt Activity Tolerance: Patient tolerated treatment well Patient left: in chair;with call bell/phone within reach;with chair alarm set Nurse Communication: Mobility status PT Visit Diagnosis: Unsteadiness on feet (R26.81);Muscle weakness (generalized) (M62.81);Difficulty in walking, not elsewhere classified (R26.2)     Time: 8775-8751 PT Time Calculation (min) (ACUTE ONLY): 24 min  Charges:    $Gait Training: 8-22 mins $Therapeutic Exercise: 8-22 mins PT General Charges $$ ACUTE PT VISIT: 1 Visit                    Darice Bohr, PTA  [name] 12/13/2023, 1:57 PM

## 2023-12-14 ENCOUNTER — Encounter
Admission: EM | Disposition: A | Discharge status: Skilled Nursing Facility | Payer: Self-pay | Source: Home / Self Care | Attending: Emergency Medicine

## 2023-12-14 ENCOUNTER — Observation Stay

## 2023-12-14 ENCOUNTER — Observation Stay: Admitting: Anesthesiology

## 2023-12-14 DIAGNOSIS — N1832 Chronic kidney disease, stage 3b: Secondary | ICD-10-CM | POA: Diagnosis not present

## 2023-12-14 DIAGNOSIS — R590 Localized enlarged lymph nodes: Secondary | ICD-10-CM | POA: Diagnosis not present

## 2023-12-14 DIAGNOSIS — Z87891 Personal history of nicotine dependence: Secondary | ICD-10-CM | POA: Diagnosis not present

## 2023-12-14 DIAGNOSIS — Z7984 Long term (current) use of oral hypoglycemic drugs: Secondary | ICD-10-CM | POA: Diagnosis not present

## 2023-12-14 DIAGNOSIS — J9621 Acute and chronic respiratory failure with hypoxia: Secondary | ICD-10-CM | POA: Diagnosis not present

## 2023-12-14 DIAGNOSIS — E1122 Type 2 diabetes mellitus with diabetic chronic kidney disease: Secondary | ICD-10-CM | POA: Diagnosis not present

## 2023-12-14 DIAGNOSIS — I129 Hypertensive chronic kidney disease with stage 1 through stage 4 chronic kidney disease, or unspecified chronic kidney disease: Secondary | ICD-10-CM | POA: Diagnosis not present

## 2023-12-14 DIAGNOSIS — D631 Anemia in chronic kidney disease: Secondary | ICD-10-CM | POA: Diagnosis not present

## 2023-12-14 DIAGNOSIS — R911 Solitary pulmonary nodule: Secondary | ICD-10-CM | POA: Diagnosis not present

## 2023-12-14 DIAGNOSIS — Z48813 Encounter for surgical aftercare following surgery on the respiratory system: Secondary | ICD-10-CM | POA: Diagnosis not present

## 2023-12-14 DIAGNOSIS — J189 Pneumonia, unspecified organism: Secondary | ICD-10-CM | POA: Diagnosis not present

## 2023-12-14 DIAGNOSIS — R918 Other nonspecific abnormal finding of lung field: Secondary | ICD-10-CM | POA: Diagnosis not present

## 2023-12-14 DIAGNOSIS — R531 Weakness: Secondary | ICD-10-CM | POA: Diagnosis not present

## 2023-12-14 DIAGNOSIS — I700 Atherosclerosis of aorta: Secondary | ICD-10-CM | POA: Diagnosis not present

## 2023-12-14 HISTORY — PX: VIDEO BRONCHOSCOPY WITH ENDOBRONCHIAL NAVIGATION: SHX6175

## 2023-12-14 LAB — BASIC METABOLIC PANEL WITH GFR
Anion gap: 6 (ref 5–15)
BUN: 22 mg/dL (ref 8–23)
CO2: 23 mmol/L (ref 22–32)
Calcium: 10.2 mg/dL (ref 8.9–10.3)
Chloride: 111 mmol/L (ref 98–111)
Creatinine, Ser: 1.24 mg/dL — ABNORMAL HIGH (ref 0.44–1.00)
GFR, Estimated: 42 mL/min — ABNORMAL LOW (ref >60)
Glucose, Bld: 141 mg/dL — ABNORMAL HIGH (ref 70–99)
Potassium: 4.2 mmol/L (ref 3.5–5.1)
Sodium: 140 mmol/L (ref 135–145)

## 2023-12-14 LAB — CBC
HCT: 27 % — ABNORMAL LOW (ref 36.0–46.0)
Hemoglobin: 8.6 g/dL — ABNORMAL LOW (ref 12.0–15.0)
MCH: 28 pg (ref 26.0–34.0)
MCHC: 31.9 g/dL (ref 30.0–36.0)
MCV: 87.9 fL (ref 80.0–100.0)
Platelets: 277 K/uL (ref 150–400)
RBC: 3.07 MIL/uL — ABNORMAL LOW (ref 3.87–5.11)
RDW: 13.8 % (ref 11.5–15.5)
WBC: 7.3 K/uL (ref 4.0–10.5)
nRBC: 0 % (ref 0.0–0.2)

## 2023-12-14 LAB — GLUCOSE, CAPILLARY
Glucose-Capillary: 115 mg/dL — ABNORMAL HIGH (ref 70–99)
Glucose-Capillary: 130 mg/dL — ABNORMAL HIGH (ref 70–99)
Glucose-Capillary: 145 mg/dL — ABNORMAL HIGH (ref 70–99)
Glucose-Capillary: 190 mg/dL — ABNORMAL HIGH (ref 70–99)

## 2023-12-14 LAB — PHOSPHORUS: Phosphorus: 2.6 mg/dL (ref 2.5–4.6)

## 2023-12-14 LAB — SURGICAL PCR SCREEN
MRSA, PCR: NEGATIVE
Staphylococcus aureus: POSITIVE — AB

## 2023-12-14 LAB — MAGNESIUM: Magnesium: 1.8 mg/dL (ref 1.7–2.4)

## 2023-12-14 SURGERY — VIDEO BRONCHOSCOPY WITH ENDOBRONCHIAL NAVIGATION
Anesthesia: General | Laterality: Left

## 2023-12-14 MED ORDER — DEXAMETHASONE SODIUM PHOSPHATE 10 MG/ML IJ SOLN
INTRAMUSCULAR | Status: DC | PRN
  Administered 2023-12-14: 4 mg via INTRAVENOUS

## 2023-12-14 MED ORDER — ROCURONIUM BROMIDE 100 MG/10ML IV SOLN
INTRAVENOUS | Status: DC | PRN
  Administered 2023-12-14: 50 mg via INTRAVENOUS

## 2023-12-14 MED ORDER — LACTATED RINGERS IV SOLN: INTRAVENOUS | Status: DC | PRN

## 2023-12-14 MED ORDER — PROPOFOL 10 MG/ML IV BOLUS
INTRAVENOUS | Status: AC
  Filled 2023-12-14: qty 20

## 2023-12-14 MED ORDER — ROCURONIUM BROMIDE 10 MG/ML (PF) SYRINGE
PREFILLED_SYRINGE | INTRAVENOUS | Status: AC
  Filled 2023-12-14: qty 10

## 2023-12-14 MED ORDER — DEXAMETHASONE SODIUM PHOSPHATE 10 MG/ML IJ SOLN
INTRAMUSCULAR | Status: AC
  Filled 2023-12-14: qty 1

## 2023-12-14 MED ORDER — FENTANYL CITRATE (PF) 100 MCG/2ML IJ SOLN
INTRAMUSCULAR | Status: DC | PRN
  Administered 2023-12-14 (×2): 25 ug via INTRAVENOUS

## 2023-12-14 MED ORDER — PROPOFOL 10 MG/ML IV BOLUS
INTRAVENOUS | Status: DC | PRN
  Administered 2023-12-14: 100 mg via INTRAVENOUS
  Administered 2023-12-14: 50 mg via INTRAVENOUS

## 2023-12-14 MED ORDER — FENTANYL CITRATE (PF) 100 MCG/2ML IJ SOLN: 25.0000 ug | INTRAMUSCULAR | Status: DC | PRN

## 2023-12-14 MED ORDER — MUPIROCIN 2 % EX OINT
1.0000 | TOPICAL_OINTMENT | Freq: Two times a day (BID) | CUTANEOUS | Status: AC
Start: 2023-12-14 — End: 2023-12-18
  Administered 2023-12-14 – 2023-12-18 (×10): 1 via NASAL
  Filled 2023-12-14: qty 22

## 2023-12-14 MED ORDER — EPHEDRINE 5 MG/ML INJ
INTRAVENOUS | Status: AC
Start: 2023-12-14 — End: 2023-12-14
  Filled 2023-12-14: qty 5

## 2023-12-14 MED ORDER — PHENYLEPHRINE 80 MCG/ML (10ML) SYRINGE FOR IV PUSH (FOR BLOOD PRESSURE SUPPORT)
PREFILLED_SYRINGE | INTRAVENOUS | Status: DC | PRN
  Administered 2023-12-14: 160 ug via INTRAVENOUS
  Administered 2023-12-14: 80 ug via INTRAVENOUS

## 2023-12-14 MED ORDER — FENTANYL CITRATE (PF) 100 MCG/2ML IJ SOLN
INTRAMUSCULAR | Status: AC
  Filled 2023-12-14: qty 2

## 2023-12-14 MED ORDER — ONDANSETRON HCL 4 MG/2ML IJ SOLN
INTRAMUSCULAR | Status: DC | PRN
  Administered 2023-12-14: 4 mg via INTRAVENOUS

## 2023-12-14 MED ORDER — ONDANSETRON HCL 4 MG/2ML IJ SOLN
INTRAMUSCULAR | Status: AC
  Filled 2023-12-14: qty 2

## 2023-12-14 MED ORDER — CHLORHEXIDINE GLUCONATE CLOTH 2 % EX PADS: 6.0000 | MEDICATED_PAD | Freq: Every day | CUTANEOUS | Status: DC

## 2023-12-14 MED ORDER — PHENYLEPHRINE 80 MCG/ML (10ML) SYRINGE FOR IV PUSH (FOR BLOOD PRESSURE SUPPORT)
PREFILLED_SYRINGE | INTRAVENOUS | Status: AC
  Filled 2023-12-14: qty 10

## 2023-12-14 MED ORDER — LIDOCAINE HCL (CARDIAC) PF 100 MG/5ML IV SOSY
PREFILLED_SYRINGE | INTRAVENOUS | Status: DC | PRN
  Administered 2023-12-14: 60 mg via INTRAVENOUS

## 2023-12-14 MED ORDER — LIDOCAINE HCL (PF) 2 % IJ SOLN
INTRAMUSCULAR | Status: AC
  Filled 2023-12-14: qty 5

## 2023-12-14 MED ORDER — LACTATED RINGERS IV SOLN: INTRAVENOUS | Status: DC

## 2023-12-14 MED ORDER — ACETAMINOPHEN 10 MG/ML IV SOLN: 1000.0000 mg | Freq: Once | INTRAVENOUS | Status: DC | PRN

## 2023-12-14 MED ORDER — CHLORHEXIDINE GLUCONATE CLOTH 2 % EX PADS
6.0000 | MEDICATED_PAD | Freq: Every day | CUTANEOUS | Status: DC
  Administered 2023-12-14 – 2023-12-15 (×2): 6 via TOPICAL

## 2023-12-14 MED ORDER — SODIUM CHLORIDE 0.9 % IV SOLN: INTRAVENOUS | Status: AC

## 2023-12-14 NOTE — Anesthesia Preprocedure Evaluation (Addendum)
 Anesthesia Evaluation  Patient identified by MRN, date of birth, ID band Patient awake    Reviewed: Allergy & Precautions, NPO status , Patient's Chart, lab work & pertinent test results  History of Anesthesia Complications Negative for: history of anesthetic complications  Airway Mallampati: III   Neck ROM: Full    Dental  (+) Missing   Pulmonary pneumonia (community acquired; cough x4 months), former smoker (quit 2004)   Pulmonary exam normal breath sounds clear to auscultation       Cardiovascular Normal cardiovascular exam Rhythm:Regular Rate:Normal  ECG 12/10/23: NSR; LAD; RBBB  Echo 05/04/22:  NORMAL LEFT VENTRICULAR SYSTOLIC FUNCTION   WITH MILD LVH  NORMAL RIGHT VENTRICULAR SYSTOLIC FUNCTION  MODERATE VALVULAR REGURGITATION  NO VALVULAR STENOSIS  MODERATE AR  MILD TR, PR  TRIVIAL MR  EF >55%     Neuro/Psych    GI/Hepatic negative GI ROS,,,  Endo/Other  diabetes, Type 2Hypothyroidism    Renal/GU Renal disease (stage III CKD)     Musculoskeletal   Abdominal   Peds  Hematology  (+) Blood dyscrasia, anemia   Anesthesia Other Findings   Reproductive/Obstetrics                              Anesthesia Physical Anesthesia Plan  ASA: 3  Anesthesia Plan: General   Post-op Pain Management:    Induction: Intravenous  PONV Risk Score and Plan: 3 and Ondansetron , Dexamethasone  and Treatment may vary due to age or medical condition  Airway Management Planned: Oral ETT  Additional Equipment:   Intra-op Plan:   Post-operative Plan: Extubation in OR  Informed Consent: I have reviewed the patients History and Physical, chart, labs and discussed the procedure including the risks, benefits and alternatives for the proposed anesthesia with the patient or authorized representative who has indicated his/her understanding and acceptance.     Dental advisory given  Plan Discussed  with: CRNA  Anesthesia Plan Comments: (Patient consented for risks of anesthesia including but not limited to:  - adverse reactions to medications - damage to eyes, teeth, lips or other oral mucosa - nerve damage due to positioning  - sore throat or hoarseness - damage to heart, brain, nerves, lungs, other parts of body or loss of life  Informed patient about role of CRNA in peri- and intra-operative care.  Patient voiced understanding.)         Anesthesia Quick Evaluation

## 2023-12-14 NOTE — Progress Notes (Signed)
 [facility] Progress Note  Patient: Sherry [name]    FMW:[id number]  DOA: 12/10/2023     Date of Service: the patient was seen and examined on 12/14/2023  Chief Complaint  Patient presents with   Fall   Brief hospital course: Sherry [name] is a 89 y.o. female with medical history significant of HTN, IIDM, HLD, hypothyroidism, CKD stage IIIb, presented with worsening of productive cough, weakness and fall.   Symptoms started about 3 weeks ago, patient started to develop productive cough with whitish phlegm, episode chills but no fever.  No chest pains.  She has been taking OTC medications with some relief.  She denies any cough or choking after eat or drink.  But she does complain of feeling  something in the throat and feeling sore while swallowing solid food.  She became so weak over the weekend and sustained a fall this morning, feeling so weak she could not stand up on her own.  Denied any LOC, no head or neck injury denied any abdominal pain no urinary symptoms..   ED Course: Temperature 98.1 blood pressure 150/95 oxygen 90% on room air.  Chest x-ray showed right lower lobe infiltrates versus atelectasis.  Blood work showed WBC 8.7 hemoglobin 10.0 BUN 22 creatinine 1.2 bicarb 25K 4.1 calcium 11.4 albumin 3.6 glucose 134.  CT head and neck negative for fracture or dislocation.  UA negative for UTI   Patient was given ceftriaxone  and doxycycline  in the ED.   Patient was admitted to the hospital and started on empiric IV antibiotics for pneumonia pending further evaluation.     Further hospital course and management as outlined below.   Assessment and Plan:  Acute ambulation impairment Generalized weakness Secondary to pneumonia most likely. CT head and C-spine were negative for acute findings.  CK level within normal limits. Neck pain and muscle spasm status post fall Started Tylenol  500 mg p.o. 3 times daily scheduled, Robaxin  40 mg p.o. 3 times daily scheduled and  oxycodone  as needed. -PT evaluation -Fall precautions   CAP, bacterial Cough - 4 months -Continue ceftriaxone  and azithromycin  -Bronchodilators, Robitussin and Tessalon  -Add Tussionex, above not helping per pt -CT chest without contrast was obtained on admission to further characterize pneumonia, shows new suspicious nodule as below.   New spiculated pulmonary nodule Concerning in setting of recent wt loss, cough of 4 months, hypercalcemia.   Hx of pulmonary nodules  Radiology recommendations for follow up: Consider one of the following in 3 months for both low-risk and high-risk individuals: (a) repeat chest CT, (b) follow-up PET-CT, or (c) tissue sampling  --Consult Pulmonology for input 9/26 s/p Bronchoscopy and Biopsy done by Dr. Parris She tolerated procedure well, no complications   Dysphagia - Appears to be at laryngeal-upper esophageal level, will consult speech therapist.   Hypercalcemia - etiology to be determined, but based on CT chest finding on new spiculated nodule, concerning for malignancy. Ca 11.4 >> 11.0>>10.6>10.2 this AM - Continue IV fluid for hydration  -Monitor BMP   Unintentional weight loss - pt reports 30# wt loss in recent 4 months, she attributes poor appetite, swallowing issues. Given CT chest    CKD stage IIIb Pt clinically volume depleted on admission., Renal function stable Cr 1.21 >> 1.16>1.4>1.24 today - Monitor BMP   HTN - Stable, continue atenolol  and losartan    IIDM -sliding scale novolog    Vitamin B12 level 362, goal is >400, started vitamin B12 oral supplement   Body mass index is 28.62 kg/m.  Interventions:  Diet: Carb modified diet DVT Prophylaxis: Subcutaneous Lovenox    Advance goals of care discussion: Full code  Family Communication: family was not present at bedside, at the time of interview.  The pt provided permission to discuss medical plan with the family. Opportunity was given to ask question and all  questions were answered satisfactorily.   Disposition:  Pt is from home, admitted with pneumonia, pulmonary nodule, needs bronchoscopy as per pulmonary, planned for bronc on 9/26, which precludes a safe discharge. Discharge to SNF, when stable.  Subjective: No significant events overnight.  Patient was seen in the PACU after bronchoscopy, patient tolerated procedure well. Patient is feeling improvement in the neck pain and able to move from side-to-side.  Patient is feeling a lot better now but still has generalized weakness has not ambulated well today. We will reassess tomorrow a.m. and then plan for disposition.   Physical Exam: General: NAD, lying comfortably Appear in no distress, affect appropriate Eyes: PERRLA ENT: Oral Mucosa Clear, moist  Neck: no JVD,  Cardiovascular: S1 and S2 Present, no Murmur,  Respiratory: good respiratory effort, Bilateral Air entry equal and Decreased, no Crackles, no wheezes Abdomen: Bowel Sound present, Soft and no tenderness,  Skin: no rashes Extremities: no Pedal edema, no calf tenderness Neurologic: without any new focal findings Gait not checked due to patient safety concerns  Vitals:   12/14/23 1415 12/14/23 1430 12/14/23 1445 12/14/23 1500  BP: (!) 145/73 131/71 (!) 151/58 (!) 144/56  Pulse: 70 66 66 66  Resp: 18 (!) 21 17 17   Temp:   98.1 F (36.7 C)   TempSrc:      SpO2: 96% 97% 97% 95%  Weight:      Height:        Intake/Output Summary (Last 24 hours) at 12/14/2023 1521 Last data filed at 12/14/2023 0400 Gross per 24 hour  Intake 2015.94 ml  Output --  Net 2015.94 ml   Filed Weights   12/10/23 1216  Weight: 78 kg    Data Reviewed: I have personally reviewed and interpreted daily labs, tele strips, imagings as discussed above. I reviewed all nursing notes, pharmacy notes, vitals, pertinent old records I have discussed plan of care as described above with RN and patient/family.  CBC: Recent Labs  Lab 12/10/23 1245  12/11/23 0519 12/12/23 0543 12/13/23 0415 12/14/23 0450  WBC 8.7 10.5 7.8 6.6 7.3  NEUTROABS 6.3  --   --   --   --   HGB 10.0* 10.1* 9.0* 9.4* 8.6*  HCT 32.5* 31.8* 28.6* 29.9* 27.0*  MCV 88.6 87.8 88.8 88.2 87.9  PLT 289 291 268 283 277   Basic Metabolic Panel: Recent Labs  Lab 12/10/23 1245 12/11/23 0519 12/12/23 0543 12/12/23 0822 12/13/23 0415 12/14/23 0450  NA 138 139 137  --  138 140  K 4.1 4.5 4.3  --  4.4 4.2  CL 105 107 107  --  107 111  CO2 25 25 23   --  26 23  GLUCOSE 134* 152* 138*  --  112* 141*  BUN 22 23 22   --  19 22  CREATININE 1.21* 1.16* 1.40*  --  1.18* 1.24*  CALCIUM 11.4* 11.0* 10.5*  --  10.6* 10.2  MG  --   --   --  1.7 1.8 1.8  PHOS  --   --  2.6  --  2.7 2.6    Studies: DG Chest Port 1 View Result Date: 12/14/2023 EXAM: 1 VIEW(S) XRAY OF  THE CHEST 12/14/2023 01:53:34 PM COMPARISON: 12/10/2023 CLINICAL HISTORY: S/P bronchoscopy [id number]. Status post bronch. FINDINGS: LUNGS AND PLEURA: Left upper lobe nodular density with fiducial marker and new surrounding ground-glass opacity. No pulmonary edema. No pleural effusion. No pneumothorax. HEART AND MEDIASTINUM: Atherosclerotic calcifications of the aortic arch. BONES AND SOFT TISSUES: No acute osseous abnormality. IMPRESSION: 1. Left upper lobe nodular density with fiducial marker and new surrounding ground-glass opacity, compatible with post-procedural change/status post bronchoscopy. 2. No pneumothorax identified. 3. Aortic arch atherosclerotic calcifications. Electronically signed by: Waddell Calk MD 12/14/2023 02:30 PM EDT RP Workstation: [id number]   DG C-Arm 1-60 Min-No Report Result Date: 12/14/2023 Fluoroscopy was utilized by the requesting physician.  No radiographic interpretation.    Scheduled Meds:  [MAR Hold] acetaminophen   500 mg Oral TID   [MAR Hold] atenolol   25 mg Oral Daily   [MAR Hold] azithromycin   500 mg Oral Daily   [MAR Hold] Chlorhexidine  Gluconate Cloth  6 each Topical Daily    [MAR Hold] vitamin B-12  500 mcg Oral Daily   [MAR Hold] diclofenac  Sodium  2 g Topical TID   [MAR Hold] feeding supplement  237 mL Oral BID BM   [MAR Hold] guaiFENesin   600 mg Oral BID   Influenza vac split trivalent PF  0.5 mL Intramuscular Tomorrow-1000   [MAR Hold] insulin  aspart  0-9 Units Subcutaneous TID WC   [MAR Hold] levothyroxine   25 mcg Oral QAC breakfast   [MAR Hold] losartan   50 mg Oral Daily   [MAR Hold] methocarbamol   500 mg Oral TID   [MAR Hold] mupirocin  ointment  1 Application Nasal BID   [MAR Hold] pantoprazole   40 mg Oral Daily   [MAR Hold] pneumococcal 20-valent conjugate vaccine  0.5 mL Intramuscular Tomorrow-1000   Continuous Infusions:  sodium chloride  75 mL/hr at 12/14/23 0948   acetaminophen      lactated ringers      PRN Meds: acetaminophen , [MAR Hold] albuterol , [MAR Hold] benzonatate , [MAR Hold] chlorpheniramine-HYDROcodone, fentaNYL  (SUBLIMAZE ) injection, [MAR Hold] ondansetron  **OR** [MAR Hold] ondansetron  (ZOFRAN ) IV, [MAR Hold] oxyCODONE   Time spent: 40 minutes  Author: ELVAN SOR. MD [facility] 12/14/2023 3:21 PM  To reach On-call, see care teams to locate the attending and reach out to them via [email or web address]. If 7PM-7AM, please contact night-coverage If you still have difficulty reaching the attending provider, please page the [facility] (Director on Call) for [facility] on amion for assistance.

## 2023-12-14 NOTE — Plan of Care (Signed)

## 2023-12-14 NOTE — Anesthesia Procedure Notes (Signed)
 Procedure Name: Intubation Date/Time: 12/14/2023 12:19 PM  Performed by: Trudy Rankin LABOR, CRNAPre-anesthesia Checklist: Patient identified, Patient being monitored, Timeout performed, Emergency Drugs available and Suction available Patient Re-evaluated:Patient Re-evaluated prior to induction Oxygen Delivery Method: Circle System Utilized Preoxygenation: Pre-oxygenation with 100% oxygen Induction Type: IV induction and Rapid sequence Laryngoscope Size: Mac, 3 and McGrath Grade View: Grade I Tube type: Oral Tube size: 8.5 mm Number of attempts: 1 Airway Equipment and Method: Stylet Placement Confirmation: ETT inserted through vocal cords under direct vision, positive ETCO2 and breath sounds checked- equal and bilateral Secured at: 21 cm Tube secured with: Tape Dental Injury: Teeth and Oropharynx as per pre-operative assessment

## 2023-12-14 NOTE — Procedures (Signed)
 ROBOTIC NAVIGATIONAL BRONCHOSCOPY PROCEDURE NOTE  FIBEROPTIC BRONCHOSCOPY WITH BRONCHOALVEOLAR LAVAGE PROCEDURE NOTE  THERAPEUTIC ASPIRATION OF TRACHEOBRONCHIAL TREE  FIDUCIAL MARKER PLACEMENT  ENDOBRONCHIAL ULTRASOUND X >/1  LYMPH NODE PROCEDURE NOTE  TRANSBRONCHIAL FNA X 1 LOBE  TRANSBRONCHIAL SURGICAL LUNG BIOPSY X 1 LOBE  RADIAL ENDOBRONCHIAL ULTRASOUND  TRANSBRONCHIAL CYTOPATHOLOGY BRUSHINGS    Flexible bronchoscopy was performed  by : Parris MD  assistance by : 1)Repiratory therapist  and 2)cytotech staff and 3) Anesthesia team and 4) Flouroscopy team and 5) IT supporting staff for robotic platform   Indication for the procedure was :  Pre-procedural H&P. The following assessment was performed on the day of the procedure prior to initiating sedation History:  Chest pain n Dyspnea y Hemoptysis n Cough y Fever n Other pertinent items n  Examination Vital signs -reviewed as per nursing documentation today Cardiac    Murmurs: n  Rubs : n  Gallop: n Lungs Wheezing: n Rales : n Rhonchi :y  Other pertinent findings: SOB/hypoxemia due to chronic lung disease   Pre-procedural assessment for Procedural Sedation included: Depth of sedation: As per anesthesia team  ASA Classification:  2 Mallampati airway assessment: 3    Medication list reviewed: y  The patient's interval history was taken and revealed: no new complaints The pre- procedure physical examination revealed: No new findings Refer to prior clinic note for details.  Informed Consent: Informed consent was obtained from:  patient after explanation of procedure and risks, benefits, as well as alternative procedures available.  Explanation of level of sedation and possible transfusion was also provided.    Procedural Preparation: Time out was performed and patient was identified by name and birthdate and procedure to be performed and side for sampling, if any, was specified. Pt was intubated by  anesthesia.  The patient was appropriately draped.   Fiberoptic bronchoscopy with airway inspection, therapeutic aspiration of tracheobronchial tree and BAL Procedure findings:  Bronchoscope was inserted via ETT  without difficulty.  Posterior oropharynx, epiglottis, arytenoids, false cords and vocal cords were not visualized as these were bypassed by endotracheal tube. The distal trachea was normal in circumference and appearance without mucosal, cartilaginous or branching abnormalities.  The main carina was mildly splayed . All right and left lobar airways were NOT visualized to the Subsegmental level due to obstructed airways from phlegm and mucus  plugging. Mucus plugging with partial to complete airway obstruction was noted bilaterally and treated with therapeutic aspiration prior to beginning of robotic bronchoscopy to allow adequate visualization and accurate lung biopsy and improve patients respiratory status.   The mucosa was : friable at target segment  Airways were notable for:        exophytic lesions :n       extrinsic compression in the following distributions: n.       Friable mucosa: y       Teacher, music /pigmentation: n     Post procedure Diagnosis:   Mucus plugging of tracheobronchial tree      Navigational Bronchoscopy Procedure Findings:   Ion robotic platform utilized for this procedure. Post appropriate planning and registration peripheral navigation was used to visualize target lesion.   Radial endobronchial US  technology utilized during this procedure for confirmation of lesion.  3D Fluoroscopy utilized for tool in lesion confirmation   TOOL IN LESION CONFIRMATION   FIDUCIAL MARKER LUL Target 1 LEFT UPPER LOBE - Transbronchial cytobrushing x 1 ,   Transbronchial FNA x 3 ,   Transbronchial surgical pathology x 4 ,  BAL was performed at this location and sent for processing with cytology  Post procedure diagnosis:  LEFT UPPER LOBE GGO  NODULE      Endobronchial ultrasound assisted hilar and mediastinal lymph node biopsies procedure findings: The fiberoptic bronchoscope was removed and the EBUS scope was introduced. Examination began to evaluate for pathologically enlarged lymph nodes starting on the RIGHT  side progressing to the LEFT side.  All lymph node biopsies performed with 21G needle. Lymph node biopsies were sent in cytolite for all stations.   STATION 10R - NOT BIOPSIED STATION 7 - 1.1CM BIOPSIED 3 TIMES STATION 10L - NOT BIOPSIED      Immediate sampling complications included:NONE immediate  Epinephrine ZERO ml was used topically  The bronchoscopy was terminated due to completion of the planned procedure and the bronchoscope was removed.   Total dosage of Lidocaine  was 1 mg  Estimated Blood loss: EXPECTED < 5cc.  Complications included:  NONE   Preliminary CXR findings :  IN PROCESS  Disposition: HOME WITH FAMILY   Follow up with Dr. Keishawna Carranza in 5 days for result discussion.     Halina Picking MD  [facility] Division of Pulmonary & Critical Care Medicine

## 2023-12-14 NOTE — Transfer of Care (Signed)
 Immediate Anesthesia Transfer of Care Note  Patient: Sherry [name]  Procedure(s) Performed: VIDEO BRONCHOSCOPY WITH ENDOBRONCHIAL NAVIGATION (Left)  Patient Location: PACU  Anesthesia Type:General  Level of Consciousness: awake and drowsy  Airway & Oxygen Therapy: Patient Spontanous Breathing and Patient connected to face mask oxygen  Post-op Assessment: Report given to RN and Post -op Vital signs reviewed and stable  Post vital signs: Reviewed and stable  Last Vitals:  Vitals Value Taken Time  BP 158/88 12/14/23 13:34  Temp    Pulse 69 12/14/23 13:41  Resp 16 12/14/23 13:41  SpO2 100 % 12/14/23 13:41  Vitals shown include unfiled device data.  Last Pain:  Vitals:   12/14/23 1056  TempSrc: Temporal  PainSc: 0-No pain      Patients Stated Pain Goal: 0 (12/11/23 0847)  Complications: No notable events documented.

## 2023-12-14 NOTE — Progress Notes (Signed)
 PT Cancellation Note  Patient Details Name: Sherry [name] MRN: [id number] DOB: 1934/05/10   Cancelled Treatment:     Pt off floor at Bronchoscopy upon attempt. Will retry next available date/time per POC.    [name] 12/14/2023, 4:46 PM

## 2023-12-14 NOTE — Progress Notes (Signed)
 PULMONOLOGY         Date: 12/14/2023,   MRN# [id number] Sherry [name] 03-26-1934     AdmissionWeight: 78 kg                 CurrentWeight: 78 kg  Referring provider: Dr Kandis   CHIEF COMPLAINT:   Acute on chronic hypoxemic respiratory failure   HISTORY OF PRESENT ILLNESS   Sherry [name] is a 89 y.o. female with medical history significant of HTN, IIDM, HLD, hypothyroidism, CKD stage IIIb, presented with worsening of productive cough, weakness and fall. She also has chronic cough and lung nodules.  She has dysphagia and expresses aspiration concerns. Her bloodwork was fairly stable compared to recent with CKD and no alarming findings.  She had chronic LLL atelectasis.  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    12/14/23- patient with no overnight events , plan for bronchoscopy today at 1pm  PAST MEDICAL HISTORY   Past Medical History:  Diagnosis Date   Allergy    Diabetes mellitus without complication (HCC)    Renal insufficiency    Thyroid  disease      SURGICAL HISTORY   Past Surgical History:  Procedure Laterality Date   CHOLECYSTECTOMY       FAMILY HISTORY   Family History  Problem Relation Age of Onset   Diabetes Mother    Hypertension Mother    Hypertension Father    Cancer Brother      SOCIAL HISTORY   Social History   Tobacco Use   Smoking status: Former    Current packs/day: 0.00    Types: Cigarettes    Quit date: 2004    Years since quitting: 21.7   Smokeless tobacco: Never  Vaping Use   Vaping status: Never Used  Substance Use Topics   Alcohol use: Not Currently   Drug use: Not Currently     MEDICATIONS    Home Medication:    Current Medication:  Current Facility-Administered Medications:    0.9 %  sodium chloride  infusion, , Intravenous, Continuous, Von Bellis, MD, Stopped at 12/14/23 9180   acetaminophen  (TYLENOL ) tablet 500 mg, 500 mg, Oral, TID, 500 mg at 12/14/23 0818 **OR**  [DISCONTINUED] acetaminophen  (TYLENOL ) suppository 650 mg, 650 mg, Rectal, Q6H PRN, Laurita Manor T, MD   albuterol  (PROVENTIL ) (2.5 MG/3ML) 0.083% nebulizer solution 2.5 mg, 2.5 mg, Inhalation, Q6H PRN, Laurita Manor T, MD   atenolol  (TENORMIN ) tablet 25 mg, 25 mg, Oral, Daily, Laurita, Ping T, MD, 25 mg at 12/14/23 9180   azithromycin  (ZITHROMAX ) tablet 500 mg, 500 mg, Oral, Daily, Laurita, Ping T, MD, 500 mg at 12/14/23 9181   benzonatate  (TESSALON ) capsule 100 mg, 100 mg, Oral, TID PRN, Laurita Manor T, MD   cefTRIAXone  (ROCEPHIN ) 2 g in sodium chloride  0.9 % 100 mL IVPB, 2 g, Intravenous, Q24H, Laurita Manor T, MD, Last Rate: 200 mL/hr at 12/14/23 0917, 2 g at 12/14/23 9082   Chlorhexidine  Gluconate Cloth 2 % PADS 6 each, 6 each, Topical, Daily, Von Bellis, MD   chlorpheniramine-HYDROcodone (TUSSIONEX) 10-8 MG/5ML suspension 5 mL, 5 mL, Oral, Q12H PRN, Fausto Sor A, DO   cyanocobalamin  (VITAMIN B12) tablet 500 mcg, 500 mcg, Oral, Daily, Von Bellis, MD, 500 mcg at 12/14/23 9180   diclofenac  Sodium (VOLTAREN ) 1 % topical gel 2 g, 2 g, Topical, TID, Von Bellis, MD, 2 g at 12/14/23 9177   feeding supplement (ENSURE PLUS HIGH PROTEIN) liquid 237 mL, 237  mL, Oral, BID BM, Mansy, Jan A, MD, 237 mL at 12/13/23 0830   guaiFENesin  (MUCINEX ) 12 hr tablet 600 mg, 600 mg, Oral, BID, Laurita Manor T, MD, 600 mg at 12/14/23 9180   Influenza vac split trivalent PF (FLUZONE HIGH-DOSE) injection 0.5 mL, 0.5 mL, Intramuscular, Tomorrow-1000, Mansy, Jan A, MD   insulin  aspart (novoLOG ) injection 0-9 Units, 0-9 Units, Subcutaneous, TID WC, Laurita Manor DASEN, MD, 2 Units at 12/13/23 1656   levothyroxine  (SYNTHROID ) tablet 25 mcg, 25 mcg, Oral, QAC breakfast, Laurita Manor T, MD, 25 mcg at 12/14/23 9382   losartan  (COZAAR ) tablet 50 mg, 50 mg, Oral, Daily, Laurita Manor T, MD, 50 mg at 12/14/23 9180   methocarbamol  (ROBAXIN ) tablet 500 mg, 500 mg, Oral, TID, Von Bellis, MD, 500 mg at 12/14/23 9180   mupirocin  ointment  (BACTROBAN ) 2 % 1 Application, 1 Application, Nasal, BID, Von Bellis, MD, 1 Application at 12/14/23 9177   ondansetron  (ZOFRAN ) tablet 4 mg, 4 mg, Oral, Q6H PRN **OR** ondansetron  (ZOFRAN ) injection 4 mg, 4 mg, Intravenous, Q6H PRN, Laurita Manor DASEN, MD   oxyCODONE  (Oxy IR/ROXICODONE ) immediate release tablet 5 mg, 5 mg, Oral, Q6H PRN, Von Bellis, MD   pantoprazole  (PROTONIX ) EC tablet 40 mg, 40 mg, Oral, Daily, Laurita, Ping T, MD, 40 mg at 12/14/23 0818   pneumococcal 20-valent conjugate vaccine (PREVNAR 20) injection 0.5 mL, 0.5 mL, Intramuscular, Tomorrow-1000, Mansy, Jan A, MD    ALLERGIES   Penicillins     REVIEW OF SYSTEMS    Review of Systems:  Gen:  Denies  fever, sweats, chills weigh loss  HEENT: Denies blurred vision, double vision, ear pain, eye pain, hearing loss, nose bleeds, sore throat Cardiac:  No dizziness, chest pain or heaviness, chest tightness,edema Resp:   reports dyspnea chronically  Gi: Denies swallowing difficulty, stomach pain, nausea or vomiting, diarrhea, constipation, bowel incontinence Gu:  Denies bladder incontinence, burning urine Ext:   Denies Joint pain, stiffness or swelling Skin: Denies  skin rash, easy bruising or bleeding or hives Endoc:  Denies polyuria, polydipsia , polyphagia or weight change Psych:   Denies depression, insomnia or hallucinations   Other:  All other systems negative   VS: BP (!) 138/51 (BP Location: Left Arm)   Pulse 64   Temp 97.8 F (36.6 C) (Oral)   Resp 18   Ht 5' 5 (1.651 m)   Wt 78 kg   SpO2 100%   BMI 28.62 kg/m      PHYSICAL EXAM    GENERAL:NAD, no fevers, chills, no weakness no fatigue HEAD: Normocephalic, atraumatic.  EYES: Pupils equal, round, reactive to light. Extraocular muscles intact. No scleral icterus.  MOUTH: Moist mucosal membrane. Dentition intact. No abscess noted.  EAR, NOSE, THROAT: Clear without exudates. No external lesions.  NECK: Supple. No thyromegaly. No nodules. No JVD.   PULMONARY: decreased breath sounds with mild rhonchi worse at bases bilaterally.  CARDIOVASCULAR: S1 and S2. Regular rate and rhythm. No murmurs, rubs, or gallops. No edema. Pedal pulses 2+ bilaterally.  GASTROINTESTINAL: Soft, nontender, nondistended. No masses. Positive bowel sounds. No hepatosplenomegaly.  MUSCULOSKELETAL: No swelling, clubbing, or edema. Range of motion full in all extremities.  NEUROLOGIC: Cranial nerves II through XII are intact. No gross focal neurological deficits. Sensation intact. Reflexes intact.  SKIN: No ulceration, lesions, rashes, or cyanosis. Skin warm and dry. Turgor intact.  PSYCHIATRIC: Mood, affect within normal limits. The patient is awake, alert and oriented x 3. Insight, judgment intact.  IMAGING   @IMAGES @   Narrative & Impression CLINICAL DATA:  Complication from pneumonia.  Fall.   EXAM: CT CHEST WITHOUT CONTRAST   TECHNIQUE: Multidetector CT imaging of the chest was performed following the standard protocol without IV contrast.   RADIATION DOSE REDUCTION: This exam was performed according to the departmental dose-optimization program which includes automated exposure control, adjustment of the mA and/or kV according to patient size and/or use of iterative reconstruction technique.   COMPARISON:  Chest x-ray same day.  CT chest 12/18/2019.   FINDINGS: Cardiovascular: The heart is moderately enlarged. There is no pericardial effusion. Aorta is normal in size. There are atherosclerotic calcifications of the aorta.   Mediastinum/Nodes: No enlarged mediastinal or axillary lymph nodes. Thyroid  gland, trachea, and esophagus demonstrate no significant findings.   Lungs/Pleura: There is a new slightly spiculated nodule in the right upper lobe anteriorly measuring 6 x 8 mm image 4/61. Focal ground-glass nodular opacity in the left upper lobe measures 1.7 x 1.2 cm and has minimally increased in size and density peripherally. There  is a new ground-glass nodule in the left upper lobe measuring 5 mm image 4/63. There is no lung consolidation, pleural effusion or pneumothorax. There is a calcified granuloma in the right upper lobe.   Upper Abdomen: Hepatic cysts are unchanged. Cystic area in the right kidney is partially imaged.   Musculoskeletal: No chest wall mass or suspicious bone lesions identified.   IMPRESSION: 1. New slightly spiculated nodule in the right upper lobe measuring 6 x 8 mm. Malignancy not excluded. 2. Focal ground-glass nodular opacity in the left upper lobe has minimally increased in size and density peripherally. Slow growing malignancy can not be excluded. Consider one of the following in 3 months for both low-risk and high-risk individuals: (a) repeat chest CT, (b) follow-up PET-CT, or (c) tissue sampling. This recommendation follows the consensus statement: Guidelines for Management of Incidental Pulmonary Nodules Detected on CT Images: From the Fleischner Society 2017; Radiology 2017; 284:228-243. 3. New ground-glass nodule in the left upper lobe measuring 5 mm, indeterminate. 4. Stable cardiomegaly. 5. Aortic atherosclerosis.   Aortic Atherosclerosis (ICD10-I70.0).     Electronically Signed   By: Greig Pique M.D.   On: 12/10/2023 18:45     Result History ASSESSMENT/PLAN   Left lower lobe pneumonia      Improved and is able to treat with outpatient antibiotics     - currently on zithromax  and rocephin  IV    - can transition to PO augmentin 875/125 bid x 5 more day    Left upper lobe ground glass nodule and right upper lobe semisolid nodule     - lung biopsy via bronchoscopy Friday 12/14/23 Noon     - off lovenox  until after procedure    - NPO post midnight     -electronic consent filled out in EPIC EMR        Thank you for allowing me to participate in the care of this patient.   Patient/Family are satisfied with care plan and all questions have been answered.     Provider disclosure: Patient with at least one acute or chronic illness or injury that poses a threat to life or bodily function and is being managed actively during this encounter.  All of the below services have been performed independently by signing provider:  review of prior documentation from internal and or external health records.  Review of previous and current lab results.  Interview and comprehensive assessment during patient  visit today. Review of current and previous chest radiographs/CT scans. Discussion of management and test interpretation with health care team and patient/family.   This document was prepared using Dragon voice recognition software and may include unintentional dictation errors.     Nelsy Madonna, M.D.  Division of Pulmonary & Critical Care Medicine

## 2023-12-15 DIAGNOSIS — J189 Pneumonia, unspecified organism: Secondary | ICD-10-CM | POA: Diagnosis not present

## 2023-12-15 DIAGNOSIS — J9621 Acute and chronic respiratory failure with hypoxia: Secondary | ICD-10-CM | POA: Diagnosis not present

## 2023-12-15 LAB — CBC
HCT: 27.1 % — ABNORMAL LOW (ref 36.0–46.0)
Hemoglobin: 8.8 g/dL — ABNORMAL LOW (ref 12.0–15.0)
MCH: 28 pg (ref 26.0–34.0)
MCHC: 32.5 g/dL (ref 30.0–36.0)
MCV: 86.3 fL (ref 80.0–100.0)
Platelets: 283 K/uL (ref 150–400)
RBC: 3.14 MIL/uL — ABNORMAL LOW (ref 3.87–5.11)
RDW: 13.7 % (ref 11.5–15.5)
WBC: 11.6 K/uL — ABNORMAL HIGH (ref 4.0–10.5)
nRBC: 0 % (ref 0.0–0.2)

## 2023-12-15 LAB — BASIC METABOLIC PANEL WITH GFR
Anion gap: 6 (ref 5–15)
BUN: 25 mg/dL — ABNORMAL HIGH (ref 8–23)
CO2: 22 mmol/L (ref 22–32)
Calcium: 10.3 mg/dL (ref 8.9–10.3)
Chloride: 108 mmol/L (ref 98–111)
Creatinine, Ser: 1.12 mg/dL — ABNORMAL HIGH (ref 0.44–1.00)
GFR, Estimated: 48 mL/min — ABNORMAL LOW (ref >60)
Glucose, Bld: 123 mg/dL — ABNORMAL HIGH (ref 70–99)
Potassium: 4.4 mmol/L (ref 3.5–5.1)
Sodium: 136 mmol/L (ref 135–145)

## 2023-12-15 LAB — GLUCOSE, CAPILLARY
Glucose-Capillary: 109 mg/dL — ABNORMAL HIGH (ref 70–99)
Glucose-Capillary: 198 mg/dL — ABNORMAL HIGH (ref 70–99)
Glucose-Capillary: 120 mg/dL — ABNORMAL HIGH (ref 70–99)

## 2023-12-15 LAB — PHOSPHORUS: Phosphorus: 2.4 mg/dL — ABNORMAL LOW (ref 2.5–4.6)

## 2023-12-15 LAB — MAGNESIUM: Magnesium: 1.9 mg/dL (ref 1.7–2.4)

## 2023-12-15 NOTE — Progress Notes (Signed)
 PULMONOLOGY         Date: 12/15/2023,   MRN# [id number] Sherry [name] Mar 22, 1934     AdmissionWeight: 78 kg                 CurrentWeight: 78 kg  Referring provider: Dr Kandis   CHIEF COMPLAINT:   Acute on chronic hypoxemic respiratory failure   HISTORY OF PRESENT ILLNESS   Sherry [name] is a 89 y.o. female with medical history significant of HTN, IIDM, HLD, hypothyroidism, CKD stage IIIb, presented with worsening of productive cough, weakness and fall. She also has chronic cough and lung nodules.  She has dysphagia and expresses aspiration concerns. Her bloodwork was fairly stable compared to recent with CKD and no alarming findings.  She had chronic LLL atelectasis.  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    12/15/23- patient sitting up in chair in no distress.  She reports coughing up some clotted material in sputum.  This is not uncommon after bronchoscopy with lung biopsy.  Her h/h is stable and renal function is improved.  She's cleared from pulmonary for dc home.   PAST MEDICAL HISTORY   Past Medical History:  Diagnosis Date   Allergy    Diabetes mellitus without complication (HCC)    Renal insufficiency    Thyroid  disease      SURGICAL HISTORY   Past Surgical History:  Procedure Laterality Date   CHOLECYSTECTOMY       FAMILY HISTORY   Family History  Problem Relation Age of Onset   Diabetes Mother    Hypertension Mother    Hypertension Father    Cancer Brother      SOCIAL HISTORY   Social History   Tobacco Use   Smoking status: Former    Current packs/day: 0.00    Types: Cigarettes    Quit date: 2004    Years since quitting: 21.7   Smokeless tobacco: Never  Vaping Use   Vaping status: Never Used  Substance Use Topics   Alcohol use: Not Currently   Drug use: Not Currently     MEDICATIONS    Home Medication:    Current Medication:  Current Facility-Administered Medications:    acetaminophen   (TYLENOL ) tablet 500 mg, 500 mg, Oral, TID, 500 mg at 12/15/23 0901 **OR** [DISCONTINUED] acetaminophen  (TYLENOL ) suppository 650 mg, 650 mg, Rectal, Q6H PRN, Laurita Manor T, MD   albuterol  (PROVENTIL ) (2.5 MG/3ML) 0.083% nebulizer solution 2.5 mg, 2.5 mg, Inhalation, Q6H PRN, Laurita Manor T, MD   atenolol  (TENORMIN ) tablet 25 mg, 25 mg, Oral, Daily, Laurita, Ping T, MD, 25 mg at 12/15/23 0901   azithromycin  (ZITHROMAX ) tablet 500 mg, 500 mg, Oral, Daily, Laurita Manor T, MD, 500 mg at 12/15/23 0901   benzonatate  (TESSALON ) capsule 100 mg, 100 mg, Oral, TID PRN, Laurita Manor DASEN, MD   Chlorhexidine  Gluconate Cloth 2 % PADS 6 each, 6 each, Topical, Daily, Von Bellis, MD, 6 each at 12/14/23 1052   chlorpheniramine-HYDROcodone (TUSSIONEX) 10-8 MG/5ML suspension 5 mL, 5 mL, Oral, Q12H PRN, Fausto Sor A, DO   cyanocobalamin  (VITAMIN B12) tablet 500 mcg, 500 mcg, Oral, Daily, Kumar, Dileep, MD, 500 mcg at 12/15/23 0901   diclofenac  Sodium (VOLTAREN ) 1 % topical gel 2 g, 2 g, Topical, TID, Von Bellis, MD, 2 g at 12/15/23 0904   feeding supplement (ENSURE PLUS HIGH PROTEIN) liquid 237 mL, 237 mL, Oral, BID BM, Mansy, Jan A, MD, 237 mL at 12/15/23  9095   guaiFENesin  (MUCINEX ) 12 hr tablet 600 mg, 600 mg, Oral, BID, Laurita Manor T, MD, 600 mg at 12/15/23 0901   Influenza vac split trivalent PF (FLUZONE HIGH-DOSE) injection 0.5 mL, 0.5 mL, Intramuscular, Tomorrow-1000, Mansy, Jan A, MD   insulin  aspart (novoLOG ) injection 0-9 Units, 0-9 Units, Subcutaneous, TID WC, Laurita Manor DASEN, MD, 1 Units at 12/14/23 1621   levothyroxine  (SYNTHROID ) tablet 25 mcg, 25 mcg, Oral, QAC breakfast, Laurita Manor T, MD, 25 mcg at 12/15/23 0534   losartan  (COZAAR ) tablet 50 mg, 50 mg, Oral, Daily, Laurita Manor T, MD, 50 mg at 12/15/23 9096   methocarbamol  (ROBAXIN ) tablet 500 mg, 500 mg, Oral, TID, Von Bellis, MD, 500 mg at 12/15/23 0901   mupirocin  ointment (BACTROBAN ) 2 % 1 Application, 1 Application, Nasal, BID, Von Bellis,  MD, 1 Application at 12/15/23 9095   ondansetron  (ZOFRAN ) tablet 4 mg, 4 mg, Oral, Q6H PRN **OR** ondansetron  (ZOFRAN ) injection 4 mg, 4 mg, Intravenous, Q6H PRN, Laurita Manor DASEN, MD   oxyCODONE  (Oxy IR/ROXICODONE ) immediate release tablet 5 mg, 5 mg, Oral, Q6H PRN, Von Bellis, MD   pantoprazole  (PROTONIX ) EC tablet 40 mg, 40 mg, Oral, Daily, Laurita, Ping T, MD, 40 mg at 12/15/23 0901   pneumococcal 20-valent conjugate vaccine (PREVNAR 20) injection 0.5 mL, 0.5 mL, Intramuscular, Tomorrow-1000, Mansy, Jan A, MD    ALLERGIES   Penicillins     REVIEW OF SYSTEMS    Review of Systems:  Gen:  Denies  fever, sweats, chills weigh loss  HEENT: Denies blurred vision, double vision, ear pain, eye pain, hearing loss, nose bleeds, sore throat Cardiac:  No dizziness, chest pain or heaviness, chest tightness,edema Resp:   reports dyspnea chronically  Gi: Denies swallowing difficulty, stomach pain, nausea or vomiting, diarrhea, constipation, bowel incontinence Gu:  Denies bladder incontinence, burning urine Ext:   Denies Joint pain, stiffness or swelling Skin: Denies  skin rash, easy bruising or bleeding or hives Endoc:  Denies polyuria, polydipsia , polyphagia or weight change Psych:   Denies depression, insomnia or hallucinations   Other:  All other systems negative   VS: BP (!) 120/52 (BP Location: Right Arm)   Pulse 77   Temp 98.2 F (36.8 C)   Resp 16   Ht 5' 5 (1.651 m)   Wt 78 kg   SpO2 98%   BMI 28.62 kg/m      PHYSICAL EXAM    GENERAL:NAD, no fevers, chills, no weakness no fatigue HEAD: Normocephalic, atraumatic.  EYES: Pupils equal, round, reactive to light. Extraocular muscles intact. No scleral icterus.  MOUTH: Moist mucosal membrane. Dentition intact. No abscess noted.  EAR, NOSE, THROAT: Clear without exudates. No external lesions.  NECK: Supple. No thyromegaly. No nodules. No JVD.  PULMONARY: decreased breath sounds with mild rhonchi worse at bases  bilaterally.  CARDIOVASCULAR: S1 and S2. Regular rate and rhythm. No murmurs, rubs, or gallops. No edema. Pedal pulses 2+ bilaterally.  GASTROINTESTINAL: Soft, nontender, nondistended. No masses. Positive bowel sounds. No hepatosplenomegaly.  MUSCULOSKELETAL: No swelling, clubbing, or edema. Range of motion full in all extremities.  NEUROLOGIC: Cranial nerves II through XII are intact. No gross focal neurological deficits. Sensation intact. Reflexes intact.  SKIN: No ulceration, lesions, rashes, or cyanosis. Skin warm and dry. Turgor intact.  PSYCHIATRIC: Mood, affect within normal limits. The patient is awake, alert and oriented x 3. Insight, judgment intact.       IMAGING   @IMAGES @   Narrative & Impression CLINICAL DATA:  Complication from pneumonia.  Fall.   EXAM: CT CHEST WITHOUT CONTRAST   TECHNIQUE: Multidetector CT imaging of the chest was performed following the standard protocol without IV contrast.   RADIATION DOSE REDUCTION: This exam was performed according to the departmental dose-optimization program which includes automated exposure control, adjustment of the mA and/or kV according to patient size and/or use of iterative reconstruction technique.   COMPARISON:  Chest x-ray same day.  CT chest 12/18/2019.   FINDINGS: Cardiovascular: The heart is moderately enlarged. There is no pericardial effusion. Aorta is normal in size. There are atherosclerotic calcifications of the aorta.   Mediastinum/Nodes: No enlarged mediastinal or axillary lymph nodes. Thyroid  gland, trachea, and esophagus demonstrate no significant findings.   Lungs/Pleura: There is a new slightly spiculated nodule in the right upper lobe anteriorly measuring 6 x 8 mm image 4/61. Focal ground-glass nodular opacity in the left upper lobe measures 1.7 x 1.2 cm and has minimally increased in size and density peripherally. There is a new ground-glass nodule in the left upper lobe measuring 5 mm  image 4/63. There is no lung consolidation, pleural effusion or pneumothorax. There is a calcified granuloma in the right upper lobe.   Upper Abdomen: Hepatic cysts are unchanged. Cystic area in the right kidney is partially imaged.   Musculoskeletal: No chest wall mass or suspicious bone lesions identified.   IMPRESSION: 1. New slightly spiculated nodule in the right upper lobe measuring 6 x 8 mm. Malignancy not excluded. 2. Focal ground-glass nodular opacity in the left upper lobe has minimally increased in size and density peripherally. Slow growing malignancy can not be excluded. Consider one of the following in 3 months for both low-risk and high-risk individuals: (a) repeat chest CT, (b) follow-up PET-CT, or (c) tissue sampling. This recommendation follows the consensus statement: Guidelines for Management of Incidental Pulmonary Nodules Detected on CT Images: From the Fleischner Society 2017; Radiology 2017; 284:228-243. 3. New ground-glass nodule in the left upper lobe measuring 5 mm, indeterminate. 4. Stable cardiomegaly. 5. Aortic atherosclerosis.   Aortic Atherosclerosis (ICD10-I70.0).     Electronically Signed   By: Greig Pique M.D.   On: 12/10/2023 18:45     Result History ASSESSMENT/PLAN   Left lower lobe pneumonia      Improved and is able to treat with outpatient antibiotics     - currently on zithromax  and rocephin  IV    - can transition to PO augmentin 875/125 bid x 5 more day    Left upper lobe ground glass nodule and right upper lobe semisolid nodule     - lung biopsy via bronchoscopy Friday 12/14/23 Noon     - off lovenox  until after procedure    - NPO post midnight     -electronic consent filled out in EPIC EMR        Thank you for allowing me to participate in the care of this patient.   Patient/Family are satisfied with care plan and all questions have been answered.    Provider disclosure: Patient with at least one acute or chronic  illness or injury that poses a threat to life or bodily function and is being managed actively during this encounter.  All of the below services have been performed independently by signing provider:  review of prior documentation from internal and or external health records.  Review of previous and current lab results.  Interview and comprehensive assessment during patient visit today. Review of current and previous chest radiographs/CT scans. Discussion of  management and test interpretation with health care team and patient/family.   This document was prepared using Dragon voice recognition software and may include unintentional dictation errors.     Faylene Allerton, M.D.  Division of Pulmonary & Critical Care Medicine

## 2023-12-15 NOTE — Plan of Care (Signed)
  Problem: Education: Goal: Ability to describe self-care measures that may prevent or decrease complications (Diabetes Survival Skills Education) will improve Outcome: Progressing Goal: Individualized Educational Video(s) Outcome: Progressing   Problem: Coping: Goal: Ability to adjust to condition or change in health will improve Outcome: Progressing   Problem: Fluid Volume: Goal: Ability to maintain a balanced intake and output will improve Outcome: Progressing   Problem: Health Behavior/Discharge Planning: Goal: Ability to identify and utilize available resources and services will improve Outcome: Progressing Goal: Ability to manage health-related needs will improve Outcome: Progressing   Problem: Metabolic: Goal: Ability to maintain appropriate glucose levels will improve Outcome: Progressing   Problem: Nutritional: Goal: Maintenance of adequate nutrition will improve Outcome: Progressing Goal: Progress toward achieving an optimal weight will improve Outcome: Progressing   Problem: Skin Integrity: Goal: Risk for impaired skin integrity will decrease Outcome: Progressing   Problem: Health Behavior/Discharge Planning: Goal: Ability to manage health-related needs will improve Outcome: Progressing   Problem: Clinical Measurements: Goal: Ability to maintain clinical measurements within normal limits will improve Outcome: Progressing Goal: Will remain free from infection Outcome: Progressing Goal: Diagnostic test results will improve Outcome: Progressing Goal: Respiratory complications will improve Outcome: Progressing Goal: Cardiovascular complication will be avoided Outcome: Progressing   Problem: Clinical Measurements: Goal: Will remain free from infection Outcome: Progressing   Problem: Activity: Goal: Risk for activity intolerance will decrease Outcome: Progressing

## 2023-12-15 NOTE — Plan of Care (Signed)

## 2023-12-15 NOTE — Progress Notes (Signed)
 [facility] Progress Note  Patient: Sherry [name]    FMW:[id number]  DOA: 12/10/2023     Date of Service: the patient was seen and examined on 12/15/2023  Chief Complaint  Patient presents with   Fall   Brief hospital course: Sherry [name] is a 89 y.o. female with medical history significant of HTN, IIDM, HLD, hypothyroidism, CKD stage IIIb, presented with worsening of productive cough, weakness and fall.   Symptoms started about 3 weeks ago, patient started to develop productive cough with whitish phlegm, episode chills but no fever.  No chest pains.  She has been taking OTC medications with some relief.  She denies any cough or choking after eat or drink.  But she does complain of feeling  something in the throat and feeling sore while swallowing solid food.  She became so weak over the weekend and sustained a fall this morning, feeling so weak she could not stand up on her own.  Denied any LOC, no head or neck injury denied any abdominal pain no urinary symptoms..   ED Course: Temperature 98.1 blood pressure 150/95 oxygen 90% on room air.  Chest x-ray showed right lower lobe infiltrates versus atelectasis.  Blood work showed WBC 8.7 hemoglobin 10.0 BUN 22 creatinine 1.2 bicarb 25K 4.1 calcium 11.4 albumin 3.6 glucose 134.  CT head and neck negative for fracture or dislocation.  UA negative for UTI   Patient was given ceftriaxone  and doxycycline  in the ED.   Patient was admitted to the hospital and started on empiric IV antibiotics for pneumonia pending further evaluation.     Further hospital course and management as outlined below.   Assessment and Plan:  Acute ambulation impairment Generalized weakness Secondary to pneumonia most likely. CT head and C-spine were negative for acute findings.  CK level within normal limits. Neck pain and muscle spasm status post fall Started Tylenol  500 mg p.o. 3 times daily scheduled, Robaxin  40 mg p.o. 3 times daily scheduled and  oxycodone  as needed. -PT evaluation -Fall precautions 9/27 follow-up with TOC for SNF placement  CAP, bacterial Cough - 4 months -s/p ceftriaxone  x 5 days and azithromycin  received for 5 days. -Bronchodilators, Robitussin and Tessalon  -Add Tussionex, above not helping per pt -CT chest without contrast was obtained on admission to further characterize pneumonia, shows new suspicious nodule as below. Follow-up pulmonary for duration of antibiotics   New spiculated pulmonary nodule Concerning in setting of recent wt loss, cough of 4 months, hypercalcemia.   Hx of pulmonary nodules  Radiology recommendations for follow up: Consider one of the following in 3 months for both low-risk and high-risk individuals: (a) repeat chest CT, (b) follow-up PET-CT, or (c) tissue sampling  --Consult Pulmonology for input 9/26 s/p Bronchoscopy and Biopsy done by Dr. Parris She tolerated procedure well, no complications   Dysphagia - Appears to be at laryngeal-upper esophageal level, will consult speech therapist.   Hypercalcemia - etiology to be determined, but based on CT chest finding on new spiculated nodule, concerning for malignancy. Ca 11.4 >> 11.0>>10.6>10.2 this AM - Continue IV fluid for hydration  -Monitor BMP   Unintentional weight loss - pt reports 30# wt loss in recent 4 months, she attributes poor appetite, swallowing issues. Given CT chest    CKD stage IIIb Pt clinically volume depleted on admission., Renal function stable Cr 1.21 >> 1.16>1.4>1.24 today - Monitor BMP   HTN - Stable, continue atenolol  and losartan    IIDM -sliding scale novolog    Vitamin  B12 level 362, goal is >400, started vitamin B12 oral supplement  Acute on chronic neck pain Continue as needed pain meds Started Tylenol  40 mg p.o. 3 times daily scheduled and Robaxin  500 mg p.o. 3 times daily scheduled Oxycodone  as needed   Body mass index is 28.62 kg/m.  Interventions:  Diet: Carb modified  diet DVT Prophylaxis: Subcutaneous Lovenox    Advance goals of care discussion: Full code  Family Communication: family was not present at bedside, at the time of interview.  The pt provided permission to discuss medical plan with the family. Opportunity was given to ask question and all questions were answered satisfactorily.   Disposition:  Pt is from home, admitted with pneumonia, pulmonary nodule, s/p bronchoscopy and biopsy done on 9/26.   Patient is clinically stable, medically optimized to discharge Discharge to SNF, when bed will be available. Follow PT OT eval and TOC for disposition plan.   Subjective: No significant events overnight.  Patient denies any worsening of shortness of breath or productive cough.  Still having neck pain 7/10 and feels generalized weakness, patient lives alone, does not feel comfortable going home in this condition and she would like to move forward for placement to rehab PT and OT eval is pending, we will reach out to [facility] for placement   Physical Exam: General: NAD, lying comfortably Appear in no distress, affect appropriate Eyes: PERRLA ENT: Oral Mucosa Clear, moist  Neck: no JVD,  Cardiovascular: S1 and S2 Present, no Murmur,  Respiratory: good respiratory effort, Bilateral Air entry equal and Decreased, no Crackles, no wheezes Abdomen: Bowel Sound present, Soft and no tenderness,  Skin: no rashes Extremities: no Pedal edema, no calf tenderness Neurologic: without any new focal findings Gait not checked due to patient safety concerns  Vitals:   12/14/23 2011 12/15/23 0445 12/15/23 0731 12/15/23 1557  BP: (!) 163/62 125/68 (!) 120/52 (!) 136/98  Pulse: 87 79 77 81  Resp: 17 14 16 17   Temp: 98.7 F (37.1 C) 98.1 F (36.7 C) 98.2 F (36.8 C) 97.8 F (36.6 C)  TempSrc:  Oral    SpO2: 100% 98% 98% 96%  Weight:      Height:        Intake/Output Summary (Last 24 hours) at 12/15/2023 1702 Last data filed at 12/15/2023 1300 Gross per 24  hour  Intake 973.45 ml  Output --  Net 973.45 ml   Filed Weights   12/10/23 1216  Weight: 78 kg    Data Reviewed: I have personally reviewed and interpreted daily labs, tele strips, imagings as discussed above. I reviewed all nursing notes, pharmacy notes, vitals, pertinent old records I have discussed plan of care as described above with RN and patient/family.  CBC: Recent Labs  Lab 12/10/23 1245 12/11/23 0519 12/12/23 0543 12/13/23 0415 12/14/23 0450 12/15/23 0416  WBC 8.7 10.5 7.8 6.6 7.3 11.6*  NEUTROABS 6.3  --   --   --   --   --   HGB 10.0* 10.1* 9.0* 9.4* 8.6* 8.8*  HCT 32.5* 31.8* 28.6* 29.9* 27.0* 27.1*  MCV 88.6 87.8 88.8 88.2 87.9 86.3  PLT 289 291 268 283 277 283   Basic Metabolic Panel: Recent Labs  Lab 12/11/23 0519 12/12/23 0543 12/12/23 0822 12/13/23 0415 12/14/23 0450 12/15/23 0416  NA 139 137  --  138 140 136  K 4.5 4.3  --  4.4 4.2 4.4  CL 107 107  --  107 111 108  CO2 25 23  --  26 23 22   GLUCOSE 152* 138*  --  112* 141* 123*  BUN 23 22  --  19 22 25*  CREATININE 1.16* 1.40*  --  1.18* 1.24* 1.12*  CALCIUM 11.0* 10.5*  --  10.6* 10.2 10.3  MG  --   --  1.7 1.8 1.8 1.9  PHOS  --  2.6  --  2.7 2.6 2.4*    Studies: No results found.   Scheduled Meds:  acetaminophen   500 mg Oral TID   atenolol   25 mg Oral Daily   azithromycin   500 mg Oral Daily   Chlorhexidine  Gluconate Cloth  6 each Topical Daily   vitamin B-12  500 mcg Oral Daily   diclofenac  Sodium  2 g Topical TID   feeding supplement  237 mL Oral BID BM   guaiFENesin   600 mg Oral BID   Influenza vac split trivalent PF  0.5 mL Intramuscular Tomorrow-1000   insulin  aspart  0-9 Units Subcutaneous TID WC   levothyroxine   25 mcg Oral QAC breakfast   losartan   50 mg Oral Daily   methocarbamol   500 mg Oral TID   mupirocin  ointment  1 Application Nasal BID   pantoprazole   40 mg Oral Daily   pneumococcal 20-valent conjugate vaccine  0.5 mL Intramuscular Tomorrow-1000   Continuous  Infusions:   PRN Meds: albuterol , benzonatate , chlorpheniramine-HYDROcodone, ondansetron  **OR** ondansetron  (ZOFRAN ) IV, oxyCODONE   Time spent: 40 minutes  Author: ELVAN SOR. MD [facility] 12/15/2023 5:02 PM  To reach On-call, see care teams to locate the attending and reach out to them via [email or web address]. If 7PM-7AM, please contact night-coverage If you still have difficulty reaching the attending provider, please page the [facility] (Director on Call) for [facility] on amion for assistance.

## 2023-12-16 DIAGNOSIS — J189 Pneumonia, unspecified organism: Secondary | ICD-10-CM | POA: Diagnosis not present

## 2023-12-16 LAB — GLUCOSE, CAPILLARY
Glucose-Capillary: 74 mg/dL (ref 70–99)
Glucose-Capillary: 136 mg/dL — ABNORMAL HIGH (ref 70–99)
Glucose-Capillary: 146 mg/dL — ABNORMAL HIGH (ref 70–99)
Glucose-Capillary: 139 mg/dL — ABNORMAL HIGH (ref 70–99)

## 2023-12-16 LAB — CBC
HCT: 26.1 % — ABNORMAL LOW (ref 36.0–46.0)
Hemoglobin: 8.1 g/dL — ABNORMAL LOW (ref 12.0–15.0)
MCH: 27 pg (ref 26.0–34.0)
MCHC: 31 g/dL (ref 30.0–36.0)
MCV: 87 fL (ref 80.0–100.0)
Platelets: 279 K/uL (ref 150–400)
RBC: 3 MIL/uL — ABNORMAL LOW (ref 3.87–5.11)
RDW: 14 % (ref 11.5–15.5)
WBC: 7.1 K/uL (ref 4.0–10.5)
nRBC: 0 % (ref 0.0–0.2)

## 2023-12-16 LAB — BASIC METABOLIC PANEL WITH GFR
Anion gap: 8 (ref 5–15)
BUN: 27 mg/dL — ABNORMAL HIGH (ref 8–23)
CO2: 24 mmol/L (ref 22–32)
Calcium: 10.5 mg/dL — ABNORMAL HIGH (ref 8.9–10.3)
Chloride: 109 mmol/L (ref 98–111)
Creatinine, Ser: 1.19 mg/dL — ABNORMAL HIGH (ref 0.44–1.00)
GFR, Estimated: 44 mL/min — ABNORMAL LOW (ref >60)
Glucose, Bld: 82 mg/dL (ref 70–99)
Potassium: 4.2 mmol/L (ref 3.5–5.1)
Sodium: 141 mmol/L (ref 135–145)

## 2023-12-16 MED ORDER — OXYCODONE HCL 5 MG PO TABS: 5.0000 mg | ORAL_TABLET | Freq: Four times a day (QID) | ORAL | Status: DC | PRN

## 2023-12-16 MED ORDER — BISACODYL 5 MG PO TBEC
10.0000 mg | DELAYED_RELEASE_TABLET | Freq: Every day | ORAL | Status: DC
  Administered 2023-12-17: 10 mg via ORAL
  Filled 2023-12-16 (×2): qty 2

## 2023-12-16 MED ORDER — TRAMADOL HCL 50 MG PO TABS: 25.0000 mg | ORAL_TABLET | Freq: Four times a day (QID) | ORAL | Status: DC | PRN

## 2023-12-16 MED ORDER — BISACODYL 10 MG RE SUPP
10.0000 mg | Freq: Every day | RECTAL | Status: DC | PRN
  Filled 2023-12-16: qty 1

## 2023-12-16 MED ORDER — OXYCODONE HCL 5 MG PO TABS
5.0000 mg | ORAL_TABLET | Freq: Once | ORAL | Status: AC
  Administered 2023-12-16: 5 mg via ORAL
  Filled 2023-12-16: qty 1

## 2023-12-16 MED ORDER — TRAZODONE HCL 50 MG PO TABS
50.0000 mg | ORAL_TABLET | Freq: Every day | ORAL | Status: DC
  Administered 2023-12-16 – 2023-12-17 (×2): 50 mg via ORAL
  Filled 2023-12-16 (×2): qty 1

## 2023-12-16 MED ORDER — BUTALBITAL-APAP-CAFFEINE 50-325-40 MG PO TABS
1.0000 | ORAL_TABLET | Freq: Once | ORAL | Status: AC
  Administered 2023-12-16: 1 via ORAL
  Filled 2023-12-16: qty 1

## 2023-12-16 MED ORDER — POLYETHYLENE GLYCOL 3350 17 G PO PACK
17.0000 g | PACK | Freq: Two times a day (BID) | ORAL | Status: DC
Start: 2023-12-16 — End: 2023-12-19
  Administered 2023-12-17 – 2023-12-18 (×3): 17 g via ORAL
  Filled 2023-12-16 (×4): qty 1

## 2023-12-16 MED ORDER — BISACODYL 5 MG PO TBEC
10.0000 mg | DELAYED_RELEASE_TABLET | Freq: Once | ORAL | Status: AC
  Administered 2023-12-16: 10 mg via ORAL
  Filled 2023-12-16: qty 2

## 2023-12-16 MED ORDER — SODIUM CHLORIDE 0.9 % IV SOLN: INTRAVENOUS | Status: DC

## 2023-12-16 NOTE — Progress Notes (Signed)
 Mobility Specialist Progress Note:    12/16/23 1145  Mobility  Activity Refused and notified nurse if applicable   Pt refused mobility at this time d/t headache. Will attempt at a later time, all needs met.  Sherrilee Ditty Mobility Specialist Please contact via Special educational needs teacher or  Rehab office at [phone]

## 2023-12-16 NOTE — TOC Progression Note (Addendum)
 Transition of Care [facility]) - Progression Note    Patient Details  Name: Sherry [name] MRN: [id number] Date of Birth: 1934-09-16  Transition of Care [facility] CM/SW Contact  Marinda Cooks, RN Phone Number: 12/16/2023, 4:09 PM  Clinical Narrative:    This CM alerted by MD pt tentatively may need SNF /Rehab at [facility] plan. Per my chart review PT reccommended pt for [facility] PT at this time. This CM updated covering MD of this .This CM  completed FL2 to prepare if there is a  change in DC plan for pt to DC to SNF/Rehab, MD made aware of co sign needed .TOC will cont to follow dc planning / care coordination and update as applicable.        Expected Discharge Plan and Services                                               Social Drivers of Health (SDOH) Interventions SDOH Screenings   Food Insecurity: No Food Insecurity (12/10/2023)  Housing: Low Risk  (12/10/2023)  Transportation Needs: Unmet Transportation Needs (12/10/2023)  Utilities: Not At Risk (12/10/2023)  Alcohol Screen: Low Risk  (06/04/2019)  Depression (PHQ2-9): Medium Risk (05/26/2021)  Financial Resource Strain: Low Risk  (09/05/2023)   Received from [facility]  Physical Activity: Inactive (06/04/2019)  Social Connections: Moderately Integrated (12/10/2023)  Stress: No Stress Concern Present (06/04/2019)  Tobacco Use: Medium Risk (12/10/2023)    Readmission Risk Interventions     No data to display

## 2023-12-16 NOTE — Plan of Care (Signed)
  Problem: Fluid Volume: Goal: Ability to maintain a balanced intake and output will improve Outcome: Progressing   Problem: Health Behavior/Discharge Planning: Goal: Ability to manage health-related needs will improve Outcome: Progressing   Problem: Metabolic: Goal: Ability to maintain appropriate glucose levels will improve Outcome: Progressing   Problem: Nutritional: Goal: Maintenance of adequate nutrition will improve Outcome: Progressing   Problem: Skin Integrity: Goal: Risk for impaired skin integrity will decrease Outcome: Progressing   Problem: Tissue Perfusion: Goal: Adequacy of tissue perfusion will improve Outcome: Progressing

## 2023-12-16 NOTE — Progress Notes (Addendum)
 [facility] Progress Note  Patient: Sherry [name]    FMW:[id number]  DOA: 12/10/2023     Date of Service: the patient was seen and examined on 12/16/2023  Chief Complaint  Patient presents with   Fall   Brief hospital course: Sherry [name] is a 89 y.o. female with medical history significant of HTN, IIDM, HLD, hypothyroidism, CKD stage IIIb, presented with worsening of productive cough, weakness and fall.   Symptoms started about 3 weeks ago, patient started to develop productive cough with whitish phlegm, episode chills but no fever.  No chest pains.  She has been taking OTC medications with some relief.  She denies any cough or choking after eat or drink.  But she does complain of feeling  something in the throat and feeling sore while swallowing solid food.  She became so weak over the weekend and sustained a fall this morning, feeling so weak she could not stand up on her own.  Denied any LOC, no head or neck injury denied any abdominal pain no urinary symptoms..   ED Course: Temperature 98.1 blood pressure 150/95 oxygen 90% on room air.  Chest x-ray showed right lower lobe infiltrates versus atelectasis.  Blood work showed WBC 8.7 hemoglobin 10.0 BUN 22 creatinine 1.2 bicarb 25K 4.1 calcium 11.4 albumin 3.6 glucose 134.  CT head and neck negative for fracture or dislocation.  UA negative for UTI   Patient was given ceftriaxone  and doxycycline  in the ED.   Patient was admitted to the hospital and started on empiric IV antibiotics for pneumonia pending further evaluation.     Further hospital course and management as outlined below.   Assessment and Plan:  # Acute ambulation impairment # Generalized weakness Secondary to pneumonia most likely. CT head and C-spine were negative for acute findings.  CK level within normal limits. Neck pain and muscle spasm status post fall Started Tylenol  500 mg p.o. 3 times daily scheduled, Robaxin  40 mg p.o. 3 times daily scheduled  and oxycodone  as needed. -PT evaluation -Fall precautions 9/28 follow-up with TOC for SNF placement  CAP, bacterial Cough - 4 months -s/p ceftriaxone  x 5 days and azithromycin  received for 5 days. -Bronchodilators, Robitussin and Tessalon  -Add Tussionex, above not helping per pt -CT chest without contrast was obtained on admission to further characterize pneumonia, shows new suspicious nodule as below. Follow-up pulmonary for duration of antibiotics   New spiculated pulmonary nodule Concerning in setting of recent wt loss, cough of 4 months, hypercalcemia.   Hx of pulmonary nodules  Radiology recommendations for follow up: Consider one of the following in 3 months for both low-risk and high-risk individuals: (a) repeat chest CT, (b) follow-up PET-CT, or (c) tissue sampling  --Consult Pulmonology for input 9/26 s/p Bronchoscopy and Biopsy done by Dr. Parris She tolerated procedure well, no complications   Dysphagia - Appears to be at laryngeal-upper esophageal level, will consult speech therapist.   Hypercalcemia - etiology to be determined, but based on CT chest finding on new spiculated nodule, concerning for malignancy. Ca 11.4 >> 11.0>>10.6>10.2>10.5 this AM - Continue IV fluid for hydration  -Monitor BMP   Unintentional weight loss - pt reports 30# wt loss in recent 4 months, she attributes poor appetite, swallowing issues. Given CT chest    CKD stage IIIb Pt clinically volume depleted on admission., Renal function stable Cr 1.21 >> 1.16>1.4>1.24 >1.19today - Monitor BMP   HTN - Stable, continue atenolol  and losartan    IIDM -sliding scale novolog   Vitamin B12 level 362, goal is >400, started vitamin B12 oral supplement  Acute on chronic neck pain Continue as needed pain meds Started Tylenol  40 mg p.o. 3 times daily scheduled and Robaxin  500 mg p.o. 3 times daily scheduled Oxycodone  prn  # Constipation, started laxatives   Body mass index is 28.62 kg/m.   Interventions:  Diet: Carb modified diet DVT Prophylaxis: Subcutaneous Lovenox    Advance goals of care discussion: Full code  Family Communication: family was not present at bedside, at the time of interview.  The pt provided permission to discuss medical plan with the family. Opportunity was given to ask question and all questions were answered satisfactorily.   Disposition:  Pt is from home, admitted with pneumonia, pulmonary nodule, s/p bronchoscopy and biopsy done on 9/26.   Patient is clinically stable, medically optimized to discharge Discharge to SNF, when bed will be available. Follow PT OT eval and TOC for disposition plan.   Subjective: No significant events overnight.  Patient was complaining of having headache and lack of sleep last night.  Still has significant pain in the neck.  Patient is a poor historian and never ask for any as needed pain medications.  RN was advised to give her oxycodone  as needed. Patient remains at high risk for fall and readmission. Informed to TOC to follow for dispo plan and PT and OT eval.   Physical Exam: General: NAD, lying comfortably Appear in no distress, affect appropriate Eyes: PERRLA ENT: Oral Mucosa Clear, moist  Neck: no JVD,  Cardiovascular: S1 and S2 Present, no Murmur,  Respiratory: good respiratory effort, Bilateral Air entry equal and Decreased, no Crackles, no wheezes Abdomen: Bowel Sound present, Soft and no tenderness,  Skin: no rashes Extremities: no Pedal edema, no calf tenderness Neurologic: without any new focal findings Gait not checked due to patient safety concerns  Vitals:   12/15/23 1557 12/15/23 2200 12/16/23 0515 12/16/23 0802  BP: (!) 136/98 132/80 (!) 127/51 (!) 145/57  Pulse: 81 68 60 66  Resp: 17 18 18 17   Temp: 97.8 F (36.6 C) 98.2 F (36.8 C) 98.7 F (37.1 C) 98.1 F (36.7 C)  TempSrc:      SpO2: 96% 98% 99% 98%  Weight:      Height:        Intake/Output Summary (Last 24 hours) at  12/16/2023 1437 Last data filed at 12/16/2023 1300 Gross per 24 hour  Intake 960 ml  Output 1710 ml  Net -750 ml   Filed Weights   12/10/23 1216  Weight: 78 kg    Data Reviewed: I have personally reviewed and interpreted daily labs, tele strips, imagings as discussed above. I reviewed all nursing notes, pharmacy notes, vitals, pertinent old records I have discussed plan of care as described above with RN and patient/family.  CBC: Recent Labs  Lab 12/10/23 1245 12/11/23 0519 12/12/23 0543 12/13/23 0415 12/14/23 0450 12/15/23 0416 12/16/23 0705  WBC 8.7   < > 7.8 6.6 7.3 11.6* 7.1  NEUTROABS 6.3  --   --   --   --   --   --   HGB 10.0*   < > 9.0* 9.4* 8.6* 8.8* 8.1*  HCT 32.5*   < > 28.6* 29.9* 27.0* 27.1* 26.1*  MCV 88.6   < > 88.8 88.2 87.9 86.3 87.0  PLT 289   < > 268 283 277 283 279   < > = values in this interval not displayed.   Basic Metabolic Panel: Recent  Labs  Lab 12/12/23 0543 12/12/23 9177 12/13/23 0415 12/14/23 0450 12/15/23 0416 12/16/23 0705  NA 137  --  138 140 136 141  K 4.3  --  4.4 4.2 4.4 4.2  CL 107  --  107 111 108 109  CO2 23  --  26 23 22 24   GLUCOSE 138*  --  112* 141* 123* 82  BUN 22  --  19 22 25* 27*  CREATININE 1.40*  --  1.18* 1.24* 1.12* 1.19*  CALCIUM 10.5*  --  10.6* 10.2 10.3 10.5*  MG  --  1.7 1.8 1.8 1.9  --   PHOS 2.6  --  2.7 2.6 2.4*  --     Studies: No results found.   Scheduled Meds:  acetaminophen   500 mg Oral TID   atenolol   25 mg Oral Daily   azithromycin   500 mg Oral Daily   Chlorhexidine  Gluconate Cloth  6 each Topical Daily   vitamin B-12  500 mcg Oral Daily   diclofenac  Sodium  2 g Topical TID   feeding supplement  237 mL Oral BID BM   guaiFENesin   600 mg Oral BID   Influenza vac split trivalent PF  0.5 mL Intramuscular Tomorrow-1000   insulin  aspart  0-9 Units Subcutaneous TID WC   levothyroxine   25 mcg Oral QAC breakfast   losartan   50 mg Oral Daily   methocarbamol   500 mg Oral TID   mupirocin   ointment  1 Application Nasal BID   pantoprazole   40 mg Oral Daily   pneumococcal 20-valent conjugate vaccine  0.5 mL Intramuscular Tomorrow-1000   traZODone  50 mg Oral QHS   Continuous Infusions:  sodium chloride  Stopped (12/16/23 1348)    PRN Meds: albuterol , benzonatate , chlorpheniramine-HYDROcodone, ondansetron  **OR** ondansetron  (ZOFRAN ) IV, oxyCODONE   Time spent: 40 minutes  Author: ELVAN SOR. MD [facility] 12/16/2023 2:37 PM  To reach On-call, see care teams to locate the attending and reach out to them via [email or web address]. If 7PM-7AM, please contact night-coverage If you still have difficulty reaching the attending provider, please page the [facility] (Director on Call) for [facility] on amion for assistance.

## 2023-12-16 NOTE — NC FL2 (Signed)
 [place]  MEDICAID FL2 LEVEL OF CARE FORM     IDENTIFICATION  Patient Name: Sherry [name] Birthdate: 12-19-1934 Sex: female Admission Date (Current Location): 12/10/2023  [place] and [place] Number:  Chiropodist and Address:  [facility], [address], [place], [place] [address]      Provider Number: [id number]  Attending Physician Name and Address:  Von Bellis, MD  Relative Name and Phone Number:       Current Level of Care: Hospital Recommended Level of Care: Skilled Nursing Facility Prior Approval Number:    Date Approved/Denied:   PASRR Number: [id number]  Discharge Plan: SNF    Current Diagnoses: Patient Active Problem List   Diagnosis Date Noted   Unintentional weight loss 12/11/2023   Hypercalcemia 12/11/2023   Impaired ambulation 12/10/2023   CAP (community acquired pneumonia) 12/10/2023   PNA (pneumonia) 12/10/2023   Chronic daily headache 04/25/2021   Dizziness 04/25/2021   Secondary hyperparathyroidism of renal origin 11/10/2019   Anemia in chronic kidney disease 10/03/2019   Benign hypertensive kidney disease with chronic kidney disease 10/03/2019   Stage 3b chronic kidney disease (HCC) 10/03/2019   Type 2 diabetes mellitus with diabetic chronic kidney disease (HCC) 10/03/2019   Gastroesophageal reflux disease without esophagitis 05/20/2018   Weakness 05/20/2018   Syncope 03/27/2017   Hypothyroidism 03/27/2017   Adrenal nodule 10/13/2016   Health care maintenance 02/29/2016   Primary osteoarthritis of both knees 05/07/2015   Cervical radiculitis 11/03/2014   DDD (degenerative disc disease), cervical 11/03/2014   Lumbar stenosis with neurogenic claudication 11/03/2014   DDD (degenerative disc disease), lumbar 07/14/2014   Lumbar radiculitis 07/14/2014   Trochanteric bursitis of right hip 07/14/2014   Pure hypercholesterolemia 05/25/2014   Multinodular non-toxic goiter 12/25/2013   HTN (hypertension),  benign 11/26/2013   Diabetes mellitus with stage 3 chronic kidney disease (HCC) 09/14/2013   OA (osteoarthritis) 09/14/2013    Orientation RESPIRATION BLADDER Height & Weight     Self, Time, Situation, Place  Normal Continent (with some incontinence) Weight: 78 kg Height:  5' 5 (165.1 cm)  BEHAVIORAL SYMPTOMS/MOOD NEUROLOGICAL BOWEL NUTRITION STATUS   (N/A)  (N/A) Continent Diet  AMBULATORY STATUS COMMUNICATION OF NEEDS Skin   Extensive Assist Verbally Normal                       Personal Care Assistance Level of Assistance  Bathing, Feeding, Dressing Bathing Assistance: Limited assistance Feeding assistance: Independent Dressing Assistance: Limited assistance     Functional Limitations Info   (N/A)          SPECIAL CARE FACTORS FREQUENCY  PT (By licensed PT), OT (By licensed OT)     PT Frequency: 5x a wk OT Frequency: 5 x a wk            Contractures Contractures Info: Not present    Additional Factors Info  Code Status Code Status Info: Full             Current Medications (12/16/2023):  This is the current hospital active medication list Current Facility-Administered Medications  Medication Dose Route Frequency Provider Last Rate Last Admin   0.9 %  sodium chloride  infusion   Intravenous Continuous Von Bellis, MD   Stopped at 12/16/23 1348   acetaminophen  (TYLENOL ) tablet 500 mg  500 mg Oral TID Von Bellis, MD   500 mg at 12/16/23 0944   albuterol  (PROVENTIL ) (2.5 MG/3ML) 0.083% nebulizer solution 2.5 mg  2.5 mg Inhalation  Q6H PRN Laurita Cort DASEN, MD       atenolol  (TENORMIN ) tablet 25 mg  25 mg Oral Daily Zhang, Ping T, MD   25 mg at 12/16/23 0945   azithromycin  (ZITHROMAX ) tablet 500 mg  500 mg Oral Daily Laurita Cort T, MD   500 mg at 12/16/23 0945   benzonatate  (TESSALON ) capsule 100 mg  100 mg Oral TID PRN Laurita Cort DASEN, MD       bisacodyl (DULCOLAX) EC tablet 10 mg  10 mg Oral QHS Von Bellis, MD       bisacodyl (DULCOLAX) EC tablet 10  mg  10 mg Oral Once Kumar, Dileep, MD       bisacodyl (DULCOLAX) suppository 10 mg  10 mg Rectal Daily PRN Von Bellis, MD       Chlorhexidine  Gluconate Cloth 2 % PADS 6 each  6 each Topical Daily Von Bellis, MD   6 each at 12/15/23 1225   chlorpheniramine-HYDROcodone (TUSSIONEX) 10-8 MG/5ML suspension 5 mL  5 mL Oral Q12H PRN Fausto Sor A, DO       cyanocobalamin  (VITAMIN B12) tablet 500 mcg  500 mcg Oral Daily Von Bellis, MD   500 mcg at 12/16/23 0945   diclofenac  Sodium (VOLTAREN ) 1 % topical gel 2 g  2 g Topical TID Von Bellis, MD   2 g at 12/16/23 0949   feeding supplement (ENSURE PLUS HIGH PROTEIN) liquid 237 mL  237 mL Oral BID BM Mansy, Jan A, MD   237 mL at 12/16/23 0957   guaiFENesin  (MUCINEX ) 12 hr tablet 600 mg  600 mg Oral BID Laurita Cort T, MD   600 mg at 12/16/23 0945   Influenza vac split trivalent PF (FLUZONE HIGH-DOSE) injection 0.5 mL  0.5 mL Intramuscular Tomorrow-1000 Mansy, Jan A, MD       insulin  aspart (novoLOG ) injection 0-9 Units  0-9 Units Subcutaneous TID WC Laurita Cort DASEN, MD   1 Units at 12/16/23 1244   levothyroxine  (SYNTHROID ) tablet 25 mcg  25 mcg Oral QAC breakfast Laurita Cort T, MD   25 mcg at 12/16/23 0606   losartan  (COZAAR ) tablet 50 mg  50 mg Oral Daily Zhang, Ping T, MD   50 mg at 12/16/23 0945   methocarbamol  (ROBAXIN ) tablet 500 mg  500 mg Oral TID Von Bellis, MD   500 mg at 12/16/23 9053   mupirocin  ointment (BACTROBAN ) 2 % 1 Application  1 Application Nasal BID Von Bellis, MD   1 Application at 12/16/23 0950   ondansetron  (ZOFRAN ) tablet 4 mg  4 mg Oral Q6H PRN Laurita Cort DASEN, MD       Or   ondansetron  (ZOFRAN ) injection 4 mg  4 mg Intravenous Q6H PRN Laurita Cort T, MD       oxyCODONE  (Oxy IR/ROXICODONE ) immediate release tablet 5-10 mg  5-10 mg Oral Q6H PRN Von Bellis, MD       pantoprazole  (PROTONIX ) EC tablet 40 mg  40 mg Oral Daily Laurita Cort T, MD   40 mg at 12/16/23 0944   pneumococcal 20-valent conjugate vaccine (PREVNAR  20) injection 0.5 mL  0.5 mL Intramuscular Tomorrow-1000 Mansy, Jan A, MD       polyethylene glycol (MIRALAX / GLYCOLAX) packet 17 g  17 g Oral BID Von Bellis, MD       traZODone (DESYREL) tablet 50 mg  50 mg Oral QHS Von Bellis, MD         Discharge Medications: Please see discharge summary for  a list of discharge medications.  Relevant Imaging Results:  Relevant Lab Results:   Additional Information SS#[id number]  Marinda Cooks, RN

## 2023-12-16 NOTE — Progress Notes (Signed)
 Mobility Specialist Progress Note:    12/16/23 1312  Mobility  Activity Ambulated with assistance  Level of Assistance Minimal assist, patient does 75% or more  Assistive Device Front wheel walker  Distance Ambulated (ft) 25 ft  Range of Motion/Exercises Active;All extremities  Activity Response Tolerated well  Mobility visit 1 Mobility  Mobility Specialist Start Time (ACUTE ONLY) 1251  Mobility Specialist Stop Time (ACUTE ONLY) 1310  Mobility Specialist Time Calculation (min) (ACUTE ONLY) 19 min   Pt received in bed, RN in room. Agreeable to mobility, required MinA to stand with RW. Upon standing pt became dizzy. Took a rest break and then required CGA to ambulate to bathroom. Upon standing from commode, pt c/o dizziness again and weakness. Attempted to return pt to chair for safety, pt not agreeable and wanted a bath at that time. Sat pt on commode and once dizziness subsided this mobility specialist notified RN and NT. NT in to room for hygiene, all needs were met.   Sherrilee Ditty Mobility Specialist Please contact via Special educational needs teacher or  Rehab office at [phone]

## 2023-12-16 NOTE — Plan of Care (Signed)
  Problem: Education: Goal: Ability to describe self-care measures that may prevent or decrease complications (Diabetes Survival Skills Education) will improve Outcome: Progressing Goal: Individualized Educational Video(s) Outcome: Progressing   Problem: Coping: Goal: Ability to adjust to condition or change in health will improve Outcome: Progressing   Problem: Fluid Volume: Goal: Ability to maintain a balanced intake and output will improve Outcome: Progressing   Problem: Nutritional: Goal: Maintenance of adequate nutrition will improve Outcome: Progressing Goal: Progress toward achieving an optimal weight will improve Outcome: Progressing   Problem: Skin Integrity: Goal: Risk for impaired skin integrity will decrease Outcome: Progressing   Problem: Tissue Perfusion: Goal: Adequacy of tissue perfusion will improve Outcome: Progressing   Problem: Education: Goal: Knowledge of General Education information will improve Description: Including pain rating scale, medication(s)/side effects and non-pharmacologic comfort measures Outcome: Progressing   Problem: Activity: Goal: Risk for activity intolerance will decrease Outcome: Progressing   Problem: Nutrition: Goal: Adequate nutrition will be maintained Outcome: Progressing   Problem: Pain Managment: Goal: General experience of comfort will improve and/or be controlled Outcome: Progressing   Problem: Safety: Goal: Ability to remain free from injury will improve Outcome: Progressing   Problem: Skin Integrity: Goal: Risk for impaired skin integrity will decrease Outcome: Progressing   Problem: Activity: Goal: Ability to tolerate increased activity will improve Outcome: Progressing

## 2023-12-17 ENCOUNTER — Encounter: Payer: Self-pay | Admitting: Pulmonary Disease

## 2023-12-17 DIAGNOSIS — J189 Pneumonia, unspecified organism: Secondary | ICD-10-CM | POA: Diagnosis not present

## 2023-12-17 LAB — BASIC METABOLIC PANEL WITH GFR
Anion gap: 5 (ref 5–15)
BUN: 27 mg/dL — ABNORMAL HIGH (ref 8–23)
CO2: 24 mmol/L (ref 22–32)
Calcium: 10.3 mg/dL (ref 8.9–10.3)
Chloride: 110 mmol/L (ref 98–111)
Creatinine, Ser: 1.28 mg/dL — ABNORMAL HIGH (ref 0.44–1.00)
GFR, Estimated: 41 mL/min — ABNORMAL LOW (ref >60)
Glucose, Bld: 97 mg/dL (ref 70–99)
Potassium: 4.5 mmol/L (ref 3.5–5.1)
Sodium: 139 mmol/L (ref 135–145)

## 2023-12-17 LAB — CBC
HCT: 25.9 % — ABNORMAL LOW (ref 36.0–46.0)
Hemoglobin: 8.2 g/dL — ABNORMAL LOW (ref 12.0–15.0)
MCH: 27.5 pg (ref 26.0–34.0)
MCHC: 31.7 g/dL (ref 30.0–36.0)
MCV: 86.9 fL (ref 80.0–100.0)
Platelets: 274 K/uL (ref 150–400)
RBC: 2.98 MIL/uL — ABNORMAL LOW (ref 3.87–5.11)
RDW: 14 % (ref 11.5–15.5)
WBC: 6.7 K/uL (ref 4.0–10.5)
nRBC: 0 % (ref 0.0–0.2)

## 2023-12-17 LAB — GLUCOSE, CAPILLARY
Glucose-Capillary: 82 mg/dL (ref 70–99)
Glucose-Capillary: 162 mg/dL — ABNORMAL HIGH (ref 70–99)
Glucose-Capillary: 108 mg/dL — ABNORMAL HIGH (ref 70–99)
Glucose-Capillary: 164 mg/dL — ABNORMAL HIGH (ref 70–99)

## 2023-12-17 MED ORDER — SODIUM CHLORIDE 0.9 % IV SOLN: INTRAVENOUS | Status: AC

## 2023-12-17 NOTE — Anesthesia Postprocedure Evaluation (Signed)
 Anesthesia Post Note  Patient: Sherry [name]  Procedure(s) Performed: VIDEO BRONCHOSCOPY WITH ENDOBRONCHIAL NAVIGATION (Left)  Patient location during evaluation: PACU Anesthesia Type: General Level of consciousness: awake and alert Pain management: pain level controlled Vital Signs Assessment: post-procedure vital signs reviewed and stable Respiratory status: spontaneous breathing, nonlabored ventilation and respiratory function stable Cardiovascular status: blood pressure returned to baseline and stable Postop Assessment: no apparent nausea or vomiting Anesthetic complications: no   No notable events documented.   Last Vitals:  Vitals:   12/16/23 2027 12/17/23 0526  BP: (!) 130/54 (!) 139/57  Pulse: 62 60  Resp: 16 16  Temp: 36.7 C 37 C  SpO2: 100% 98%    Last Pain:  Vitals:   12/16/23 2130  TempSrc:   PainSc: 3                  Fairy MARLA Akeia Perot

## 2023-12-17 NOTE — TOC Progression Note (Signed)
 Transition of Care [facility]) - Progression Note    Patient Details  Name: Sherry [name] MRN: [id number] Date of Birth: [birth year]  Transition of Care [facility] CM/SW Contact  Alvaro Louder, [place] Phone Number: 12/17/2023, 3:44 PM  Clinical Narrative:   Shara approved, can admit to [facility] tomorrow. LCSWA confirmed bed availability at [facility]. Plan to admit tomorrow.  JluyPI:[id number] Dates:9/29-10/03/2023 Next Review Date: 10.1.2025   TOC to follow for Discharge.                     Expected Discharge Plan and Services                                               Social Drivers of Health (SDOH) Interventions SDOH Screenings   Food Insecurity: No Food Insecurity (12/10/2023)  Housing: Low Risk  (12/10/2023)  Transportation Needs: Unmet Transportation Needs (12/10/2023)  Utilities: Not At Risk (12/10/2023)  Alcohol Screen: Low Risk  (06/04/2019)  Depression (PHQ2-9): Medium Risk (05/26/2021)  Financial Resource Strain: Low Risk  (09/05/2023)   Received from [facility]  Physical Activity: Inactive (06/04/2019)  Social Connections: Moderately Integrated (12/10/2023)  Stress: No Stress Concern Present (06/04/2019)  Tobacco Use: Medium Risk (12/10/2023)    Readmission Risk Interventions     No data to display

## 2023-12-17 NOTE — Plan of Care (Signed)
  Problem: Fluid Volume: Goal: Ability to maintain a balanced intake and output will improve Outcome: Progressing   Problem: Health Behavior/Discharge Planning: Goal: Ability to identify and utilize available resources and services will improve Outcome: Progressing Goal: Ability to manage health-related needs will improve Outcome: Progressing   Problem: Metabolic: Goal: Ability to maintain appropriate glucose levels will improve Outcome: Progressing   Problem: Nutritional: Goal: Maintenance of adequate nutrition will improve Outcome: Progressing Goal: Progress toward achieving an optimal weight will improve Outcome: Progressing   Problem: Skin Integrity: Goal: Risk for impaired skin integrity will decrease Outcome: Progressing

## 2023-12-17 NOTE — Progress Notes (Signed)
 [facility] Progress Note  Patient: Sherry [name]    FMW:[id number]  DOA: 12/10/2023     Date of Service: the patient was seen and examined on 12/17/2023  Chief Complaint  Patient presents with   Fall   Brief hospital course: Sherry [name] is a 89 y.o. female with medical history significant of HTN, IIDM, HLD, hypothyroidism, CKD stage IIIb, presented with worsening of productive cough, weakness and fall.   Symptoms started about 3 weeks ago, patient started to develop productive cough with whitish phlegm, episode chills but no fever.  No chest pains.  She has been taking OTC medications with some relief.  She denies any cough or choking after eat or drink.  But she does complain of feeling  something in the throat and feeling sore while swallowing solid food.  She became so weak over the weekend and sustained a fall this morning, feeling so weak she could not stand up on her own.  Denied any LOC, no head or neck injury denied any abdominal pain no urinary symptoms..   ED Course: Temperature 98.1 blood pressure 150/95 oxygen 90% on room air.  Chest x-ray showed right lower lobe infiltrates versus atelectasis.  Blood work showed WBC 8.7 hemoglobin 10.0 BUN 22 creatinine 1.2 bicarb 25K 4.1 calcium 11.4 albumin 3.6 glucose 134.  CT head and neck negative for fracture or dislocation.  UA negative for UTI   Patient was given ceftriaxone  and doxycycline  in the ED.   Patient was admitted to the hospital and started on empiric IV antibiotics for pneumonia pending further evaluation.     Further hospital course and management as outlined below.   Assessment and Plan:  # Acute ambulation impairment # Generalized weakness Secondary to pneumonia most likely. CT head and C-spine were negative for acute findings.  CK level within normal limits. Neck pain and muscle spasm status post fall Started Tylenol  500 mg p.o. 3 times daily scheduled, Robaxin  40 mg p.o. 3 times daily scheduled  and oxycodone  as needed. -PT evaluation -Fall precautions 9/28 follow-up with TOC for SNF placement  # CAP, bacterial Cough - 4 months -s/p ceftriaxone  x 5 days and azithromycin  received for 5 days. -Bronchodilators, Robitussin and Tessalon  -Add Tussionex, above not helping per pt -CT chest without contrast was obtained on admission to further characterize pneumonia, shows new suspicious nodule as below. As per pulmonary no need of more antibiotics.   # New spiculated pulmonary nodule Concerning in setting of recent wt loss, cough of 4 months, hypercalcemia.   Hx of pulmonary nodules  Radiology recommendations for follow up: Consider one of the following in 3 months for both low-risk and high-risk individuals: (a) repeat chest CT, (b) follow-up PET-CT, or (c) tissue sampling  --Consult Pulmonology for input 9/26 s/p Bronchoscopy and Biopsy done by Dr. Parris She tolerated procedure well, no complications   # Dysphagia - Appears to be at laryngeal-upper esophageal level, will consult speech therapist.   # Hypercalcemia - etiology to be determined, but based on CT chest finding on new spiculated nodule, concerning for malignancy. Ca 11.4 >> 11.0>>10.6>10.2>10.5>10.3 this AM - Continue IV fluid for hydration  -Monitor BMP   Unintentional weight loss - pt reports 30# wt loss in recent 4 months, she attributes poor appetite, swallowing issues. Given CT chest    CKD stage IIIb Pt clinically volume depleted on admission., Renal function stable Cr 1.21 >> 1.16>1.4>1.24 >1.19> 1.28 today Continue IV fluid and encourage for oral hydration. - Monitor BMP  HTN - Stable, continue atenolol  and losartan    IIDM -sliding scale novolog    Vitamin B12 level 362, goal is >400, started vitamin B12 oral supplement  Acute on chronic neck pain Continue as needed pain meds Started Tylenol  40 mg p.o. 3 times daily scheduled and Robaxin  500 mg p.o. 3 times daily scheduled Oxycodone   prn  # Constipation, started laxatives   Body mass index is 28.62 kg/m.  Interventions:  Diet: Carb modified diet DVT Prophylaxis: Subcutaneous Lovenox    Advance goals of care discussion: Full code  Family Communication: family was not present at bedside, at the time of interview.  The pt provided permission to discuss medical plan with the family. Opportunity was given to ask question and all questions were answered satisfactorily.   Disposition:  Pt is from home, admitted with pneumonia, pulmonary nodule, s/p bronchoscopy and biopsy done on 9/26.   Patient is clinically stable, medically optimized to discharge Discharge to SNF, when bed will be available. 9/29 insurance Auth approved, patient can go to SNF tomorrow a.m. as per TOC.  Subjective: No significant events overnight.  Patient is still having significant neck pain at home, she feels improvement in the pain after oxycodone  which has been continued as needed.  Still requesting to increase the dose of oxycodone  as she is complaining of pain 10 out of 10. Patient still feels generalized weakness and would like to go to rehab. Denied any chest pain or palpitation, no shortness of breath, no any other complaints   Physical Exam: General: NAD, lying comfortably Appear in no distress, affect appropriate Eyes: PERRLA ENT: Oral Mucosa Clear, moist  Neck: no JVD,  Cardiovascular: S1 and S2 Present, no Murmur,  Respiratory: good respiratory effort, Bilateral Air entry equal and Decreased, no Crackles, no wheezes Abdomen: Bowel Sound present, Soft and no tenderness,  Skin: no rashes Extremities: no Pedal edema, no calf tenderness Neurologic: without any new focal findings Gait not checked due to patient safety concerns  Vitals:   12/16/23 1634 12/16/23 2027 12/17/23 0526 12/17/23 0806  BP: (!) 147/57 (!) 130/54 (!) 139/57 (!) 142/62  Pulse: 69 62 60 69  Resp: 18 16 16 17   Temp: 98.6 F (37 C) 98 F (36.7 C) 98.6 F (37  C) 98.4 F (36.9 C)  TempSrc:    Oral  SpO2: 100% 100% 98% 99%  Weight:      Height:        Intake/Output Summary (Last 24 hours) at 12/17/2023 1519 Last data filed at 12/17/2023 1300 Gross per 24 hour  Intake 1171.24 ml  Output 500 ml  Net 671.24 ml   Filed Weights   12/10/23 1216  Weight: 78 kg    Data Reviewed: I have personally reviewed and interpreted daily labs, tele strips, imagings as discussed above. I reviewed all nursing notes, pharmacy notes, vitals, pertinent old records I have discussed plan of care as described above with RN and patient/family.  CBC: Recent Labs  Lab 12/13/23 0415 12/14/23 0450 12/15/23 0416 12/16/23 0705 12/17/23 0517  WBC 6.6 7.3 11.6* 7.1 6.7  HGB 9.4* 8.6* 8.8* 8.1* 8.2*  HCT 29.9* 27.0* 27.1* 26.1* 25.9*  MCV 88.2 87.9 86.3 87.0 86.9  PLT 283 277 283 279 274   Basic Metabolic Panel: Recent Labs  Lab 12/12/23 0543 12/12/23 0822 12/13/23 0415 12/14/23 0450 12/15/23 0416 12/16/23 0705 12/17/23 0517  NA 137  --  138 140 136 141 139  K 4.3  --  4.4 4.2 4.4 4.2 4.5  CL 107  --  107 111 108 109 110  CO2 23  --  26 23 22 24 24   GLUCOSE 138*  --  112* 141* 123* 82 97  BUN 22  --  19 22 25* 27* 27*  CREATININE 1.40*  --  1.18* 1.24* 1.12* 1.19* 1.28*  CALCIUM 10.5*  --  10.6* 10.2 10.3 10.5* 10.3  MG  --  1.7 1.8 1.8 1.9  --   --   PHOS 2.6  --  2.7 2.6 2.4*  --   --     Studies: No results found.   Scheduled Meds:  acetaminophen   500 mg Oral TID   atenolol   25 mg Oral Daily   bisacodyl  10 mg Oral QHS   Chlorhexidine  Gluconate Cloth  6 each Topical Daily   vitamin B-12  500 mcg Oral Daily   diclofenac  Sodium  2 g Topical TID   feeding supplement  237 mL Oral BID BM   guaiFENesin   600 mg Oral BID   Influenza vac split trivalent PF  0.5 mL Intramuscular Tomorrow-1000   insulin  aspart  0-9 Units Subcutaneous TID WC   levothyroxine   25 mcg Oral QAC breakfast   losartan   50 mg Oral Daily   methocarbamol   500 mg Oral  TID   mupirocin  ointment  1 Application Nasal BID   pantoprazole   40 mg Oral Daily   pneumococcal 20-valent conjugate vaccine  0.5 mL Intramuscular Tomorrow-1000   polyethylene glycol  17 g Oral BID   traZODone  50 mg Oral QHS   Continuous Infusions:  sodium chloride  75 mL/hr at 12/17/23 1253    PRN Meds: albuterol , benzonatate , bisacodyl, chlorpheniramine-HYDROcodone, ondansetron  **OR** ondansetron  (ZOFRAN ) IV, oxyCODONE   Time spent: 40 minutes  Author: ELVAN SOR. MD [facility] 12/17/2023 3:19 PM  To reach On-call, see care teams to locate the attending and reach out to them via [email or web address]. If 7PM-7AM, please contact night-coverage If you still have difficulty reaching the attending provider, please page the [facility] (Director on Call) for [facility] on amion for assistance.

## 2023-12-17 NOTE — Progress Notes (Signed)
 Physical Therapy Treatment Patient Details Name: Sherry [name] MRN: [id number] DOB: [birth year] Today's Date: 12/17/2023   History of Present Illness Sherry [name] is a 89 y.o. female with medical history significant of HTN, IIDM, HLD, hypothyroidism, CKD stage IIIb, presented with worsening of productive cough, weakness and fall.    PT Comments  Pt received up in recliner this am, c/o ongoing blood tinged productive cough after receiving bronch w/ bx last week. Initially attempted gait training with Rollator as previous session to simulate home function. Pt demonstrated increased unsteadiness and difficulty controlling device. RW given to pt for remainder of distance, tolerating only 48ft total with CG/MinA, requiring chair for seated rest break. Unable to tolerate further activity. No SOB/dizziness, Gross strength R LE 4-/5, L LE 3/5. Pt has had a definite functional decline since last skilled PT session and no longer appears at a safe level to return home without further PT intervention. Will change recommendations to STR at this time to safely attain functional goals prior to returning home.    If plan is discharge home, recommend the following: A little help with walking and/or transfers;A little help with bathing/dressing/bathroom;Assistance with cooking/housework;Help with stairs or ramp for entrance;Assist for transportation   Can travel by private vehicle        Equipment Recommendations  None recommended by PT    Recommendations for Other Services       Precautions / Restrictions Precautions Precautions: Fall Recall of Precautions/Restrictions: Intact Restrictions Weight Bearing Restrictions Per Provider Order: No     Mobility  Bed Mobility               General bed mobility comments:  (NT up in chair)    Transfers Overall transfer level: Needs assistance Equipment used: Rollator (4 wheels) Transfers: Sit to/from Stand Sit to Stand: Min assist            General transfer comment:  (Increased struggle this am due to weakness and lower leg discomfort)    Ambulation/Gait Ambulation/Gait assistance: Min assist Gait Distance (Feet):  (30) Assistive device: Rolling walker (2 wheels), Rollator (4 wheels) Gait Pattern/deviations: Step-to pattern, Decreased stride length, Drifts right/left, Wide base of support, Trunk flexed Gait velocity: decreased     General Gait Details:  (Poor ability to progress distance due to fatigue and weakness throughout. Pt required chair to be brought up to return to recliner)   Stairs             Wheelchair Mobility     Tilt Bed    Modified Rankin (Stroke Patients Only)       Balance Overall balance assessment: Needs assistance Sitting-balance support: Feet supported Sitting balance-Leahy Scale: Good     Standing balance support: Bilateral upper extremity supported, During functional activity, Reliant on assistive device for balance Standing balance-Leahy Scale: Fair Standing balance comment: Heavy reliance on Rollator for balance. No LOB noted.                            Communication Communication Communication: No apparent difficulties  Cognition Arousal: Alert Behavior During Therapy: WFL for tasks assessed/performed   PT - Cognitive impairments: No apparent impairments                       PT - Cognition Comments: pleasant and agreeable to PT session. A&Ox3 Following commands: Impaired Following commands impaired: Follows one step commands with increased time  Cueing Cueing Techniques: Verbal cues, Visual cues  Exercises General Exercises - Lower Extremity Ankle Circles/Pumps: AROM, Both, 10 reps Long Arc Quad: AROM, Both, 5 reps, Seated Other Exercises Other Exercises:  (Pt educated on role of acute PT, current LOF and concerns regarding d/c home with HHPT due to increased weakness over last several days)    General Comments General comments  (skin integrity, edema, etc.):  (B lower legs painful to light touch. Pt was scheduled for either an Una boot or Lymph compression device earlier this year and missed appointment.)      Pertinent Vitals/Pain Pain Assessment Pain Assessment: Faces Faces Pain Scale: Hurts little more Pain Location:  (R Lower leg > R) Pain Descriptors / Indicators: Aching, Discomfort, Sharp Pain Intervention(s): Limited activity within patient's tolerance    Home Living                          Prior Function            PT Goals (current goals can now be found in the care plan section) Acute Rehab PT Goals Patient Stated Goal: to go home    Frequency    Min 1X/week      PT Plan      Co-evaluation              AM-PAC PT 6 Clicks Mobility   Outcome Measure  Help needed turning from your back to your side while in a flat bed without using bedrails?: A Lot Help needed moving from lying on your back to sitting on the side of a flat bed without using bedrails?: A Lot Help needed moving to and from a bed to a chair (including a wheelchair)?: A Little Help needed standing up from a chair using your arms (e.g., wheelchair or bedside chair)?: A Little Help needed to walk in hospital room?: A Little Help needed climbing 3-5 steps with a railing? : A Lot 6 Click Score: 15    End of Session Equipment Utilized During Treatment: Gait belt Activity Tolerance: Patient limited by fatigue;Patient limited by pain Patient left: in chair;with call bell/phone within reach;with chair alarm set Nurse Communication: Mobility status PT Visit Diagnosis: Unsteadiness on feet (R26.81);Muscle weakness (generalized) (M62.81);Difficulty in walking, not elsewhere classified (R26.2)     Time: 1121-1202 PT Time Calculation (min) (ACUTE ONLY): 41 min  Charges:    $Gait Training: 8-22 mins $Therapeutic Exercise: 8-22 mins $Therapeutic Activity: 8-22 mins PT General Charges $$ ACUTE PT VISIT: 1  Visit                    Darice Bohr, PTA  [name] 12/17/2023, 12:12 PM

## 2023-12-17 NOTE — Plan of Care (Signed)
  Problem: Education: Goal: Ability to describe self-care measures that may prevent or decrease complications (Diabetes Survival Skills Education) will improve Outcome: Progressing Goal: Individualized Educational Video(s) Outcome: Progressing   Problem: Coping: Goal: Ability to adjust to condition or change in health will improve Outcome: Progressing   Problem: Fluid Volume: Goal: Ability to maintain a balanced intake and output will improve Outcome: Progressing   Problem: Health Behavior/Discharge Planning: Goal: Ability to identify and utilize available resources and services will improve Outcome: Progressing Goal: Ability to manage health-related needs will improve Outcome: Progressing   Problem: Metabolic: Goal: Ability to maintain appropriate glucose levels will improve Outcome: Progressing   Problem: Nutritional: Goal: Maintenance of adequate nutrition will improve Outcome: Progressing Goal: Progress toward achieving an optimal weight will improve Outcome: Progressing   Problem: Health Behavior/Discharge Planning: Goal: Ability to manage health-related needs will improve Outcome: Progressing   Problem: Education: Goal: Knowledge of General Education information will improve Description: Including pain rating scale, medication(s)/side effects and non-pharmacologic comfort measures Outcome: Progressing   Problem: Clinical Measurements: Goal: Ability to maintain clinical measurements within normal limits will improve Outcome: Progressing Goal: Will remain free from infection Outcome: Progressing Goal: Diagnostic test results will improve Outcome: Progressing Goal: Respiratory complications will improve Outcome: Progressing Goal: Cardiovascular complication will be avoided Outcome: Progressing

## 2023-12-18 DIAGNOSIS — E1122 Type 2 diabetes mellitus with diabetic chronic kidney disease: Secondary | ICD-10-CM | POA: Diagnosis not present

## 2023-12-18 DIAGNOSIS — K219 Gastro-esophageal reflux disease without esophagitis: Secondary | ICD-10-CM | POA: Diagnosis not present

## 2023-12-18 DIAGNOSIS — N1832 Chronic kidney disease, stage 3b: Secondary | ICD-10-CM | POA: Diagnosis not present

## 2023-12-18 DIAGNOSIS — M549 Dorsalgia, unspecified: Secondary | ICD-10-CM | POA: Diagnosis not present

## 2023-12-18 DIAGNOSIS — J189 Pneumonia, unspecified organism: Secondary | ICD-10-CM | POA: Diagnosis not present

## 2023-12-18 DIAGNOSIS — J159 Unspecified bacterial pneumonia: Secondary | ICD-10-CM | POA: Diagnosis not present

## 2023-12-18 DIAGNOSIS — K5900 Constipation, unspecified: Secondary | ICD-10-CM | POA: Diagnosis not present

## 2023-12-18 DIAGNOSIS — R509 Fever, unspecified: Secondary | ICD-10-CM | POA: Diagnosis not present

## 2023-12-18 DIAGNOSIS — I10 Essential (primary) hypertension: Secondary | ICD-10-CM | POA: Diagnosis not present

## 2023-12-18 DIAGNOSIS — R590 Localized enlarged lymph nodes: Secondary | ICD-10-CM | POA: Diagnosis not present

## 2023-12-18 DIAGNOSIS — R531 Weakness: Secondary | ICD-10-CM | POA: Diagnosis not present

## 2023-12-18 DIAGNOSIS — Z87891 Personal history of nicotine dependence: Secondary | ICD-10-CM | POA: Diagnosis not present

## 2023-12-18 DIAGNOSIS — R053 Chronic cough: Secondary | ICD-10-CM | POA: Diagnosis not present

## 2023-12-18 DIAGNOSIS — E039 Hypothyroidism, unspecified: Secondary | ICD-10-CM | POA: Diagnosis not present

## 2023-12-18 DIAGNOSIS — Z7401 Bed confinement status: Secondary | ICD-10-CM | POA: Diagnosis not present

## 2023-12-18 DIAGNOSIS — R262 Difficulty in walking, not elsewhere classified: Secondary | ICD-10-CM | POA: Diagnosis not present

## 2023-12-18 DIAGNOSIS — E1129 Type 2 diabetes mellitus with other diabetic kidney complication: Secondary | ICD-10-CM | POA: Diagnosis not present

## 2023-12-18 DIAGNOSIS — R918 Other nonspecific abnormal finding of lung field: Secondary | ICD-10-CM | POA: Diagnosis not present

## 2023-12-18 DIAGNOSIS — N2581 Secondary hyperparathyroidism of renal origin: Secondary | ICD-10-CM | POA: Diagnosis not present

## 2023-12-18 DIAGNOSIS — R1310 Dysphagia, unspecified: Secondary | ICD-10-CM | POA: Diagnosis not present

## 2023-12-18 DIAGNOSIS — R911 Solitary pulmonary nodule: Secondary | ICD-10-CM | POA: Diagnosis not present

## 2023-12-18 DIAGNOSIS — N179 Acute kidney failure, unspecified: Secondary | ICD-10-CM | POA: Diagnosis not present

## 2023-12-18 DIAGNOSIS — I129 Hypertensive chronic kidney disease with stage 1 through stage 4 chronic kidney disease, or unspecified chronic kidney disease: Secondary | ICD-10-CM | POA: Diagnosis not present

## 2023-12-18 DIAGNOSIS — J449 Chronic obstructive pulmonary disease, unspecified: Secondary | ICD-10-CM | POA: Diagnosis not present

## 2023-12-18 DIAGNOSIS — E119 Type 2 diabetes mellitus without complications: Secondary | ICD-10-CM | POA: Diagnosis not present

## 2023-12-18 DIAGNOSIS — M6281 Muscle weakness (generalized): Secondary | ICD-10-CM | POA: Diagnosis not present

## 2023-12-18 DIAGNOSIS — R2681 Unsteadiness on feet: Secondary | ICD-10-CM | POA: Diagnosis not present

## 2023-12-18 DIAGNOSIS — R600 Localized edema: Secondary | ICD-10-CM | POA: Diagnosis not present

## 2023-12-18 DIAGNOSIS — I959 Hypotension, unspecified: Secondary | ICD-10-CM | POA: Diagnosis not present

## 2023-12-18 DIAGNOSIS — R634 Abnormal weight loss: Secondary | ICD-10-CM | POA: Diagnosis not present

## 2023-12-18 DIAGNOSIS — R2689 Other abnormalities of gait and mobility: Secondary | ICD-10-CM | POA: Diagnosis not present

## 2023-12-18 DIAGNOSIS — R5383 Other fatigue: Secondary | ICD-10-CM | POA: Diagnosis not present

## 2023-12-18 DIAGNOSIS — R41841 Cognitive communication deficit: Secondary | ICD-10-CM | POA: Diagnosis not present

## 2023-12-18 DIAGNOSIS — R296 Repeated falls: Secondary | ICD-10-CM | POA: Diagnosis not present

## 2023-12-18 DIAGNOSIS — G479 Sleep disorder, unspecified: Secondary | ICD-10-CM | POA: Diagnosis not present

## 2023-12-18 LAB — CBC
HCT: 26.5 % — ABNORMAL LOW (ref 36.0–46.0)
Hemoglobin: 8.5 g/dL — ABNORMAL LOW (ref 12.0–15.0)
MCH: 28.1 pg (ref 26.0–34.0)
MCHC: 32.1 g/dL (ref 30.0–36.0)
MCV: 87.7 fL (ref 80.0–100.0)
Platelets: 268 K/uL (ref 150–400)
RBC: 3.02 MIL/uL — ABNORMAL LOW (ref 3.87–5.11)
RDW: 14 % (ref 11.5–15.5)
WBC: 7.5 K/uL (ref 4.0–10.5)
nRBC: 0 % (ref 0.0–0.2)

## 2023-12-18 LAB — BASIC METABOLIC PANEL WITH GFR
Anion gap: 7 (ref 5–15)
BUN: 24 mg/dL — ABNORMAL HIGH (ref 8–23)
CO2: 23 mmol/L (ref 22–32)
Calcium: 10.3 mg/dL (ref 8.9–10.3)
Chloride: 108 mmol/L (ref 98–111)
Creatinine, Ser: 1.2 mg/dL — ABNORMAL HIGH (ref 0.44–1.00)
GFR, Estimated: 44 mL/min — ABNORMAL LOW (ref >60)
Glucose, Bld: 147 mg/dL — ABNORMAL HIGH (ref 70–99)
Potassium: 4.4 mmol/L (ref 3.5–5.1)
Sodium: 138 mmol/L (ref 135–145)

## 2023-12-18 LAB — GLUCOSE, CAPILLARY
Glucose-Capillary: 117 mg/dL — ABNORMAL HIGH (ref 70–99)
Glucose-Capillary: 160 mg/dL — ABNORMAL HIGH (ref 70–99)
Glucose-Capillary: 159 mg/dL — ABNORMAL HIGH (ref 70–99)

## 2023-12-18 LAB — CYTOLOGY - NON PAP

## 2023-12-18 LAB — MAGNESIUM: Magnesium: 1.8 mg/dL (ref 1.7–2.4)

## 2023-12-18 LAB — PHOSPHORUS: Phosphorus: 2.2 mg/dL — ABNORMAL LOW (ref 2.5–4.6)

## 2023-12-18 MED ORDER — OXYCODONE HCL 5 MG PO TABS: 5.0000 mg | ORAL_TABLET | Freq: Four times a day (QID) | ORAL | 0 refills | Status: AC | PRN

## 2023-12-18 MED ORDER — METHOCARBAMOL 500 MG PO TABS: 500.0000 mg | ORAL_TABLET | Freq: Three times a day (TID) | ORAL | Status: AC | PRN

## 2023-12-18 MED ORDER — ACETAMINOPHEN 325 MG PO TABS: 650.0000 mg | ORAL_TABLET | Freq: Four times a day (QID) | ORAL | Status: AC | PRN

## 2023-12-18 MED ORDER — K PHOS MONO-SOD PHOS DI & MONO 155-852-130 MG PO TABS
500.0000 mg | ORAL_TABLET | Freq: Four times a day (QID) | ORAL | Status: AC
  Administered 2023-12-18 (×2): 500 mg via ORAL
  Filled 2023-12-18: qty 2

## 2023-12-18 MED ORDER — LOSARTAN POTASSIUM 50 MG PO TABS: 50.0000 mg | ORAL_TABLET | Freq: Every day | ORAL | Status: AC

## 2023-12-18 MED ORDER — CYANOCOBALAMIN 500 MCG PO TABS: 500.0000 ug | ORAL_TABLET | Freq: Every day | ORAL | Status: AC

## 2023-12-18 MED ORDER — BISACODYL 10 MG RE SUPP: 10.0000 mg | Freq: Every day | RECTAL | Status: AC | PRN

## 2023-12-18 MED ORDER — TRAZODONE HCL 50 MG PO TABS: 50.0000 mg | ORAL_TABLET | Freq: Every day | ORAL | Status: AC

## 2023-12-18 MED ORDER — POLYETHYLENE GLYCOL 3350 17 G PO PACK: 17.0000 g | PACK | Freq: Two times a day (BID) | ORAL | Status: AC

## 2023-12-18 MED ORDER — BISACODYL 5 MG PO TBEC: 10.0000 mg | DELAYED_RELEASE_TABLET | Freq: Every day | ORAL | Status: AC

## 2023-12-18 NOTE — Progress Notes (Signed)
 Mobility Specialist Progress Note:    12/18/23 0835  Mobility  Activity Ambulated with assistance  Level of Assistance Minimal assist, patient does 75% or more  Assistive Device Front wheel walker  Distance Ambulated (ft) 30 ft  Range of Motion/Exercises Active;All extremities  Activity Response Tolerated well  Mobility visit 1 Mobility  Mobility Specialist Start Time (ACUTE ONLY) 0802  Mobility Specialist Stop Time (ACUTE ONLY) 0835  Mobility Specialist Time Calculation (min) (ACUTE ONLY) 33 min   Pt received in chair, agreeable to mobility. Required MinA to stand and ambulate with RW. Tolerated well, assisted pt with hygiene and peri care. Returned to chair, nursing in room. All needs met.  Sherrilee Ditty Mobility Specialist Please contact via Special educational needs teacher or  Rehab office at [phone]

## 2023-12-18 NOTE — TOC Transition Note (Signed)
 Transition of Care [facility]) - Discharge Note   Patient Details  Name: Sherry [name] MRN: [id number] Date of Birth: 03-03-1935  [facility] CM/SW Contact:  Alvaro Louder, LCSW Phone Number: 12/18/2023, 4:18 PM   Clinical Narrative:     LCSWA received insurance approval for patient to admit to SNF [facility]. LCSWA confirmed with MD that patient is stable for discharge. LCSWA notified the patient and they are in agreement with discharge. LCSWA confirmed bed is available at SNF. Transport arranged with [facility] for next available.  [phone] Number to call report [phone]   TOC signing off  Final next level of care: Skilled Nursing Facility Barriers to Discharge: No Barriers Identified   Patient Goals and CMS Choice            Discharge Placement              Patient chooses bed at: [facility] Patient to be transferred to facility by: [facility] Name of family member notified: Self Patient and family notified of of transfer: 12/18/23  Discharge Plan and Services Additional resources added to the After Visit Summary for                                       Social Drivers of Health (SDOH) Interventions SDOH Screenings   Food Insecurity: No Food Insecurity (12/10/2023)  Housing: Low Risk  (12/10/2023)  Transportation Needs: Unmet Transportation Needs (12/10/2023)  Utilities: Not At Risk (12/10/2023)  Alcohol Screen: Low Risk  (06/04/2019)  Depression (PHQ2-9): Medium Risk (05/26/2021)  Financial Resource Strain: Low Risk  (09/05/2023)   Received from [facility]  Physical Activity: Inactive (06/04/2019)  Social Connections: Moderately Integrated (12/10/2023)  Stress: No Stress Concern Present (06/04/2019)  Tobacco Use: Medium Risk (12/10/2023)     Readmission Risk Interventions     No data to display

## 2023-12-18 NOTE — Discharge Summary (Signed)
 [facility] Discharge Summary   Patient: Sherry [name] FMW:[id number]  PCP: Sherry Norleen BIRCH, MD  Date of admission: 12/10/2023   Date of discharge:  12/18/2023     Discharge Diagnoses:  Principal Problem:   PNA (pneumonia) Active Problems:   Impaired ambulation   CAP (community acquired pneumonia)   Unintentional weight loss   Hypercalcemia   Admitted From: Home Disposition:  SNF   Recommendations for Outpatient Follow-up:  Follow with PCP, patient should be seen by an MD in 1 to 2 days, continue monitor BP and titrate medications accordingly. Repeat BMP in 1 week, held torsemide for now due to possible dehydration and hypercalcemia Patient received IV fluid during hospital stay due to hypercalcemia, renal functions improved Follow-up with nephrology in 1 to 2 weeks for CKD and primary hyperparathyroidism, as per prior notes prior Auth for Sensipar was denied.  Follow-up with oncology in 1 to 2 weeks, patient had a diagnosis of MGUS Follow-up with pulmonary in 1 to 2 weeks for biopsy report and further workup Follow up LABS/TEST:  as above   Contact information for after-discharge care     Destination     [facility] .   Service: Skilled Nursing Contact information: [address] [place]  [address] [phone]                    Diet recommendation: Cardiac and Carb modified diet  Activity: The patient is advised to gradually reintroduce usual activities, as tolerated  Discharge Condition: stable  Code Status: Full code   History of present illness: As per the H and P dictated on admission. Hospital Course:   Sherry [name] is a 89 y.o. female with medical history significant of HTN, IIDM, HLD, hypothyroidism, CKD stage IIIb, presented with worsening of productive cough, weakness and fall.   Symptoms started about 3 weeks ago, patient started to develop productive cough with whitish phlegm,  episode chills but no fever.  No chest pains.  She has been taking OTC medications with some relief.  She denies any cough or choking after eat or drink.  But she does complain of feeling  something in the throat and feeling sore while swallowing solid food.  She became so weak over the weekend and sustained a fall this morning, feeling so weak she could not stand up on her own.  Denied any LOC, no head or neck injury denied any abdominal pain no urinary symptoms..   ED Course: Temperature 98.1 blood pressure 150/95 oxygen 90% on room air.  Chest x-ray showed right lower lobe infiltrates versus atelectasis.  Blood work showed WBC 8.7 hemoglobin 10.0 BUN 22 creatinine 1.2 bicarb 25K 4.1 calcium 11.4 albumin 3.6 glucose 134.  CT head and neck negative for fracture or dislocation.  UA negative for UTI   Patient was given ceftriaxone  and doxycycline  in the ED.   Patient was admitted to the hospital and started on empiric IV antibiotics for pneumonia pending further evaluation.     Further hospital course and management as outlined below.     Assessment and Plan:  # Acute ambulation impairment # Generalized weakness Multifactorial including pneumonia.   CT head and C-spine were negative for acute findings.  CK level within normal limits. Neck pain and muscle spasm s/p fall S/p Tylenol  500 mg p.o. TID scheduled and Robaxin  500 mg p.o. TID scheduled and oxycodone  as needed.  Patient's pain is improving, so continue Tylenol , Robaxin  as needed  and oxycodone  as needed.  PT eval done, recommend SNF placement.  Patient is clinically stable, medically optimized and stable to discharge to SNF today.    # CAP, most likely bacterial Cough - 4 months -s/p ceftriaxone  x 5 days and azithromycin  received for 5 days. S/p Bronchodilators, Robitussin and Tessalon .  No respiratory symptoms, denies any cough at this time.  -CT chest without contrast was obtained on admission to further characterize pneumonia,  shows new suspicious nodule as below. As per pulmonary no need of more antibiotics.    # New spiculated pulmonary nodule Concerning in setting of recent wt loss, cough of 4 months, hypercalcemia.   Hx of pulmonary nodules  Radiology recommendations for follow up: Consider one of the following in 3 months for both low-risk and high-risk individuals: (a) repeat chest CT, (b) follow-up PET-CT, or (c) tissue sampling  --Consult Pulmonology for input 9/26 s/p Bronchoscopy and Biopsy done by Dr. Parris She tolerated procedure well, no complications.  Follow-up with pulmonary in 1 to 2 weeks for biopsy report and further management as an outpatient.    # Dysphagia: Appears to be at laryngeal-upper esophageal level,  Currently patient is tolerating regular diet well.  Denied any complaints.   # Primary hyperparathyroidism # Hypercalcemia: Ca 11.4 >> 10.3 this AM S/p IV fluid given for hydration.  Calcium level is stable now.  Repeat BMP after 1 week and follow with nephrology as an outpatient.  Continue oral hydration, held the torsemide for now.   # Unintentional weight loss - pt reports 30# wt loss in recent 4 months, she attributes poor appetite, swallowing issues.  Continue protein supplement and follow with pulmonary for further workup of pulmonary nodule.   # CKD stage IIIb: Pt was clinically volume depleted on admission., Renal function stable Cr 1.20 today, s/p IVF given for hydration.  Continue oral hydration, hold off torsemide for now.  Follow with nephrology in 1 to 2 weeks.  Repeat BMP after 1 week.   # HTN: continue atenolol  and losartan .  Monitor BP and titrate medications accordingly.   # NIDDM T2: s/p sliding scale insulin  during hospital stay.  Continue diabetic diet, resumed metformin  home dose.   # Vitamin B12 level 362, goal is >400, started vitamin B12 oral supplement   # Acute on chronic neck pain: Continue pain meds as described above. # Constipation, started  laxatives  Body mass index is 28.62 kg/m.  Nutrition Interventions:   Pain control  - [place]  Controlled Substance Reporting System database could not be reviewed as website was not working.   - Oxycodone  5 mg p.o. prn 15 tablets prescribed as per SNF requirement.  - Patient was instructed, not to drive, operate heavy machinery, perform activities at heights, swimming or participation in water activities or provide baby sitting services while on Pain, Sleep and Anxiety Medications; until her outpatient Physician has advised to do so again.  - Also recommended to not to take more than prescribed Pain, Sleep and Anxiety Medications.  Patient was seen by physical therapy, who recommended Therapy, SNF placement, which was arranged. On the day of the discharge the patient's vitals were stable, and no other acute medical condition were reported by patient. the patient was felt safe to be discharge at SNF with Therapy.  Consultants: Pulmonary Procedures: Bronchoscopy and biopsy  Discharge Exam: General: Appear in no distress, Oral Mucosa Clear, moist. Cardiovascular: S1 and S2 Present, no Murmur, Respiratory: normal respiratory effort, Bilateral Air entry present and no  Crackles, no wheezes Abdomen: Bowel Sound present, Soft and no tenderness. Extremities: no Pedal edema, no calf tenderness Neurology: alert and oriented to time, place, and person affect appropriate.  Filed Weights   12/10/23 1216  Weight: 78 kg   Vitals:   12/18/23 0413 12/18/23 0755  BP: (!) 135/48 (!) 144/56  Pulse: 76 70  Resp: 18 15  Temp: 98.8 F (37.1 C) 98.4 F (36.9 C)  SpO2: 99% 100%    DISCHARGE MEDICATION: Allergies as of 12/18/2023       Reactions   Penicillins Swelling, Rash   Has patient had a PCN reaction causing immediate rash, facial/tongue/throat swelling, SOB or lightheadedness with hypotension: No Has patient had a PCN reaction causing severe rash involving mucus membranes or skin  necrosis: No Has patient had a PCN reaction that required hospitalization: No Has patient had a PCN reaction occurring within the last 10 years: No If all of the above answers are NO, then may proceed with Cephalosporin use.        Medication List     PAUSE taking these medications    torsemide 20 MG tablet Wait to take this until your doctor or other care provider tells you to start again. Commonly known as: DEMADEX Take 40 mg by mouth daily.       STOP taking these medications    lidocaine  5 % Commonly known as: Lidoderm    montelukast  10 MG tablet Commonly known as: SINGULAIR    neomycin-polymyxin b-dexamethasone  3.5-10000-0.1 Susp Commonly known as: MAXITROL   omeprazole 40 MG capsule Commonly known as: PRILOSEC   predniSONE 20 MG tablet Commonly known as: DELTASONE       TAKE these medications    Accu-Chek Aviva Plus test strip Generic drug: glucose blood   Accu-Chek Aviva Plus w/Device Kit See admin instructions.   Accu-Chek Aviva Soln   acetaminophen  325 MG tablet Commonly known as: TYLENOL  Take 2 tablets (650 mg total) by mouth every 6 (six) hours as needed for mild pain (pain score 1-3), headache or fever.   albuterol  108 (90 Base) MCG/ACT inhaler Commonly known as: VENTOLIN  HFA Inhale 2 puffs into the lungs every 6 (six) hours as needed.   atenolol  25 MG tablet Commonly known as: TENORMIN  Take 25 mg by mouth daily.   bisacodyl 5 MG EC tablet Commonly known as: DULCOLAX Take 2 tablets (10 mg total) by mouth at bedtime.   bisacodyl 10 MG suppository Commonly known as: DULCOLAX Place 1 suppository (10 mg total) rectally daily as needed for severe constipation.   cyanocobalamin  500 MCG tablet Commonly known as: VITAMIN B12 Take 1 tablet (500 mcg total) by mouth daily. Start taking on: December 19, 2023   diclofenac  Sodium 1 % Gel Commonly known as: VOLTAREN  Apply 2 g topically as needed.   guaiFENesin -codeine  100-10 MG/5ML  syrup Take 2.5 mLs by mouth every 6 (six) hours as needed for cough.   levothyroxine  25 MCG tablet Commonly known as: SYNTHROID  TAKE 25 mcg TABLET EVERY DAY ON AN EMPTY STOMACH WITH A GLASS OF WATER AT LEAST 30 TO 60 MINUTES BEFORE BREAKFAST What changed:  how much to take how to take this when to take this additional instructions   losartan  50 MG tablet Commonly known as: COZAAR  Take 1 tablet (50 mg total) by mouth daily. What changed: how much to take   metFORMIN  500 MG tablet Commonly known as: GLUCOPHAGE  TAKE 500 mg TABLET TWICE DAILY WITH MEALS  Do not take on 1/10 or 03/30/17 due  to contrast given for CT scan. Restart taking on 03/31/17. What changed:  how much to take how to take this when to take this additional instructions   methocarbamol  500 MG tablet Commonly known as: ROBAXIN  Take 1 tablet (500 mg total) by mouth every 8 (eight) hours as needed for muscle spasms.   oxyCODONE  5 MG immediate release tablet Commonly known as: Oxy IR/ROXICODONE  Take 1-2 tablets (5-10 mg total) by mouth every 6 (six) hours as needed for severe pain (pain score 7-10) or moderate pain (pain score 4-6).   pantoprazole  40 MG tablet Commonly known as: PROTONIX  Take 40 mg by mouth.   polyethylene glycol 17 g packet Commonly known as: MIRALAX / GLYCOLAX Take 17 g by mouth 2 (two) times daily. Hold off if patient is not constipated or having diarrhea   traZODone 50 MG tablet Commonly known as: DESYREL Take 1 tablet (50 mg total) by mouth at bedtime. Skip the dose if patient is sleepy and drowsy       Allergies  Allergen Reactions   Penicillins Swelling and Rash    Has patient had a PCN reaction causing immediate rash, facial/tongue/throat swelling, SOB or lightheadedness with hypotension: No Has patient had a PCN reaction causing severe rash involving mucus membranes or skin necrosis: No Has patient had a PCN reaction that required hospitalization: No Has patient had a PCN  reaction occurring within the last 10 years: No If all of the above answers are NO, then may proceed with Cephalosporin use.   Discharge Instructions     Call MD for:  difficulty breathing, headache or visual disturbances   Complete by: As directed    Call MD for:  extreme fatigue   Complete by: As directed    Call MD for:  persistant dizziness or light-headedness   Complete by: As directed    Call MD for:  persistant nausea and vomiting   Complete by: As directed    Call MD for:  severe uncontrolled pain   Complete by: As directed    Call MD for:  temperature >100.4   Complete by: As directed    Diet - low sodium heart healthy   Complete by: As directed    Diet Carb Modified   Complete by: As directed    Discharge instructions   Complete by: As directed    Follow with PCP, patient should be seen by an MD in 1 to 2 days, continue monitor BP and titrate medications accordingly. Repeat BMP in 1 week, held torsemide for now due to possible dehydration and hypercalcemia Patient received IV fluid during hospital stay due to hypercalcemia, renal functions improved Follow-up with nephrology in 1 to 2 weeks for CKD and primary hyperparathyroidism, as per prior notes prior Auth for Sensipar was denied.  Follow-up with oncology in 1 to 2 weeks, patient had a diagnosis of MGUS   Increase activity slowly   Complete by: As directed        The results of significant diagnostics from this hospitalization (including imaging, microbiology, ancillary and laboratory) are listed below for reference.    Significant Diagnostic Studies: DG Chest Port 1 View Result Date: 12/14/2023 EXAM: 1 VIEW(S) XRAY OF THE CHEST 12/14/2023 01:53:34 PM COMPARISON: 12/10/2023 CLINICAL HISTORY: S/P bronchoscopy [id number]. Status post bronch. FINDINGS: LUNGS AND PLEURA: Left upper lobe nodular density with fiducial marker and new surrounding ground-glass opacity. No pulmonary edema. No pleural effusion. No pneumothorax.  HEART AND MEDIASTINUM: Atherosclerotic calcifications of the aortic arch. BONES AND  SOFT TISSUES: No acute osseous abnormality. IMPRESSION: 1. Left upper lobe nodular density with fiducial marker and new surrounding ground-glass opacity, compatible with post-procedural change/status post bronchoscopy. 2. No pneumothorax identified. 3. Aortic arch atherosclerotic calcifications. Electronically signed by: Waddell Calk MD 12/14/2023 02:30 PM EDT RP Workstation: [id number]   DG C-Arm 1-60 Min-No Report Result Date: 12/14/2023 Fluoroscopy was utilized by the requesting physician.  No radiographic interpretation.   US  THYROID  Result Date: 12/11/2023 CLINICAL DATA:  Incidental on CT. EXAM: THYROID  ULTRASOUND TECHNIQUE: Ultrasound examination of the thyroid  gland and adjacent soft tissues was performed. COMPARISON:  CT chest 12/10/2023 FINDINGS: Parenchymal Echotexture: Moderately heterogenous Isthmus: 0.5 cm Right lobe: 2.9 x 1.3 x 1.2 cm Left lobe: 3.2 x 1.4 x 1.2 cm _________________________________________________________ Estimated total number of nodules >/= 1 cm: 3 Number of spongiform nodules >/=  2 cm not described below (TR1): 0 Number of mixed cystic and solid nodules >/= 1.5 cm not described below (TR2): 0 _________________________________________________________ Nodule # 1: Location: Right; Superior Maximum size: 1.3 cm; Other 2 dimensions: 1.2 x 1.3 cm Composition: solid/almost completely solid (2) Echogenicity: isoechoic (1) Shape: not taller-than-wide (0) Margins: ill-defined (0) Echogenic foci: none (0) ACR TI-RADS total points: 3. ACR TI-RADS risk category: TR3 (3 points). ACR TI-RADS recommendations: Given size (</=1.4 cm) and appearance, this nodule does NOT meet TI-RADS criteria for biopsy or dedicated follow-up. _________________________________________________________ Nodule # 2: Location: Right; Inferior Maximum size: 1.4 cm; Other 2 dimensions: 1.4 x 1.2 cm Composition: solid/almost completely  solid (2) Echogenicity: isoechoic (1) Shape: not taller-than-wide (0) Margins: ill-defined (0) Echogenic foci: none (0) ACR TI-RADS total points: 3. ACR TI-RADS risk category: TR3 (3 points). ACR TI-RADS recommendations: Given size (</=1.4 cm) and appearance, this nodule does NOT meet TI-RADS criteria for biopsy or dedicated follow-up. _________________________________________________________ Nodule # 3: Location: Left; Superior Maximum size: 1.0 cm; Other 2 dimensions: 0.6 x 0.7 cm Composition: solid/almost completely solid (2) Echogenicity: isoechoic (1) Shape: not taller-than-wide (0) Margins: smooth (0) Echogenic foci: none (0) ACR TI-RADS total points: 3. ACR TI-RADS risk category: TR3 (3 points). ACR TI-RADS recommendations: Given size (<1.4 cm) and appearance, this nodule does NOT meet TI-RADS criteria for biopsy or dedicated follow-up. _________________________________________________________ No enlarged or abnormal appearing lymph nodes are identified. IMPRESSION: Small and heterogeneous thyroid  gland with 3 separate ill-defined nodules identified. None of the nodules meet criteria for biopsy or dedicated follow-up. Electronically Signed   By: Marcey Moan M.D.   On: 12/11/2023 13:25   CT HEAD WO CONTRAST ( ) Addendum Date: 12/10/2023 ADDENDUM REPORT: 12/10/2023 20:24 ADDENDUM: Additional finding: Right thyroid  lobe nodules measuring up to 18 mm (for instance as seen on series 7, image 23). Given the patient's age, a non-emergent thyroid  ultrasound may be obtained for further evaluation as clinically appropriate. Reference: J Am Coll Radiol. 2015 Feb;12(2): 143-50. These results will be called to the ordering clinician or representative by the Radiologist Assistant, and communication documented in the PACS or [employer]. Electronically Signed   By: Rockey Childs D.O.   On: 12/10/2023 20:24   Result Date: 12/10/2023 CLINICAL DATA:  Provided history: Head trauma, minor. Neck trauma. Mechanical  fall. EXAM: CT HEAD WITHOUT CONTRAST CT CERVICAL SPINE WITHOUT CONTRAST TECHNIQUE: Multidetector CT imaging of the head and cervical spine was performed following the standard protocol without intravenous contrast. Multiplanar CT image reconstructions of the cervical spine were also generated. RADIATION DOSE REDUCTION: This exam was performed according to the departmental dose-optimization program which includes automated exposure control, adjustment of  the mA and/or kV according to patient size and/or use of iterative reconstruction technique. COMPARISON:  Head CT 05/01/2023. Brain MRI 06/09/2015. Cervical spine radiograph 06/28/2023. FINDINGS: CT HEAD FINDINGS Brain: Generalized cerebral atrophy. Patchy and ill-defined hypoattenuation within the cerebral white matter, nonspecific but compatible with moderate chronic small vessel ischemic disease. There is no acute intracranial hemorrhage. No demarcated cortical infarct. No extra-axial fluid collection. No evidence of an intracranial mass. No midline shift. Vascular: No hyperdense vessel.  Atherosclerotic calcifications. Skull: No calvarial fracture or aggressive osseous lesion. Sinuses/Orbits: No mass or acute finding within the imaged orbits. No significant paranasal sinus disease at the imaged levels. CT CERVICAL SPINE FINDINGS Alignment: Nonspecific reversal of the expected cervical lordosis. Levocurvature of the cervical spine. 3 mm C2-C3 grade 1 retrolisthesis. Skull base and vertebrae: The basion-dental and atlanto-dental intervals are maintained.No evidence of acute fracture to the cervical spine. Soft tissues and spinal canal: No prevertebral fluid or swelling. No visible canal hematoma. Disc levels: Cervical spondylosis with multilevel disc space narrowing, disc bulges/central disc protrusions, posterior disc osteophyte complexes, endplate spurring, uncovertebral hypertrophy and facet arthropathy. Disc space narrowing is greatest at C3-C4, C4-C5, C5-C6,  C6-C7 and C7-T1 (advanced at these levels). No appreciable high-grade spinal canal stenosis. Multilevel bony neural foraminal narrowing. Multilevel ventral osteophytes (some bridging). Degenerative changes also present at the C1-C2 articulation. Upper chest: No consolidation within the imaged lung apices. No visible pneumothorax. IMPRESSION: CT head: 1.  No evidence of an acute intracranial abnormality. 2. Parenchymal atrophy and chronic small vessel ischemic disease. CT cervical spine: 1. No evidence of an acute cervical spine fracture. 2. 3 mm C2-C3 grade 1 anterolisthesis. 3. Nonspecific reversal of the expected cervical lordosis. 4. Levocurvature of the cervical spine. 5. Cervical spondylosis as described. Electronically Signed: By: Rockey Childs D.O. On: 12/10/2023 14:05   CT Cervical Spine Wo Contrast Addendum Date: 12/10/2023 ADDENDUM REPORT: 12/10/2023 20:24 ADDENDUM: Additional finding: Right thyroid  lobe nodules measuring up to 18 mm (for instance as seen on series 7, image 23). Given the patient's age, a non-emergent thyroid  ultrasound may be obtained for further evaluation as clinically appropriate. Reference: J Am Coll Radiol. 2015 Feb;12(2): 143-50. These results will be called to the ordering clinician or representative by the Radiologist Assistant, and communication documented in the PACS or [employer]. Electronically Signed   By: Rockey Childs D.O.   On: 12/10/2023 20:24   Result Date: 12/10/2023 CLINICAL DATA:  Provided history: Head trauma, minor. Neck trauma. Mechanical fall. EXAM: CT HEAD WITHOUT CONTRAST CT CERVICAL SPINE WITHOUT CONTRAST TECHNIQUE: Multidetector CT imaging of the head and cervical spine was performed following the standard protocol without intravenous contrast. Multiplanar CT image reconstructions of the cervical spine were also generated. RADIATION DOSE REDUCTION: This exam was performed according to the departmental dose-optimization program which includes automated  exposure control, adjustment of the mA and/or kV according to patient size and/or use of iterative reconstruction technique. COMPARISON:  Head CT 05/01/2023. Brain MRI 06/09/2015. Cervical spine radiograph 06/28/2023. FINDINGS: CT HEAD FINDINGS Brain: Generalized cerebral atrophy. Patchy and ill-defined hypoattenuation within the cerebral white matter, nonspecific but compatible with moderate chronic small vessel ischemic disease. There is no acute intracranial hemorrhage. No demarcated cortical infarct. No extra-axial fluid collection. No evidence of an intracranial mass. No midline shift. Vascular: No hyperdense vessel.  Atherosclerotic calcifications. Skull: No calvarial fracture or aggressive osseous lesion. Sinuses/Orbits: No mass or acute finding within the imaged orbits. No significant paranasal sinus disease at the imaged levels. CT CERVICAL  SPINE FINDINGS Alignment: Nonspecific reversal of the expected cervical lordosis. Levocurvature of the cervical spine. 3 mm C2-C3 grade 1 retrolisthesis. Skull base and vertebrae: The basion-dental and atlanto-dental intervals are maintained.No evidence of acute fracture to the cervical spine. Soft tissues and spinal canal: No prevertebral fluid or swelling. No visible canal hematoma. Disc levels: Cervical spondylosis with multilevel disc space narrowing, disc bulges/central disc protrusions, posterior disc osteophyte complexes, endplate spurring, uncovertebral hypertrophy and facet arthropathy. Disc space narrowing is greatest at C3-C4, C4-C5, C5-C6, C6-C7 and C7-T1 (advanced at these levels). No appreciable high-grade spinal canal stenosis. Multilevel bony neural foraminal narrowing. Multilevel ventral osteophytes (some bridging). Degenerative changes also present at the C1-C2 articulation. Upper chest: No consolidation within the imaged lung apices. No visible pneumothorax. IMPRESSION: CT head: 1.  No evidence of an acute intracranial abnormality. 2. Parenchymal  atrophy and chronic small vessel ischemic disease. CT cervical spine: 1. No evidence of an acute cervical spine fracture. 2. 3 mm C2-C3 grade 1 anterolisthesis. 3. Nonspecific reversal of the expected cervical lordosis. 4. Levocurvature of the cervical spine. 5. Cervical spondylosis as described. Electronically Signed: By: Rockey Childs D.O. On: 12/10/2023 14:05   CT CHEST WO CONTRAST Result Date: 12/10/2023 CLINICAL DATA:  Complication from pneumonia.  Fall. EXAM: CT CHEST WITHOUT CONTRAST TECHNIQUE: Multidetector CT imaging of the chest was performed following the standard protocol without IV contrast. RADIATION DOSE REDUCTION: This exam was performed according to the departmental dose-optimization program which includes automated exposure control, adjustment of the mA and/or kV according to patient size and/or use of iterative reconstruction technique. COMPARISON:  Chest x-ray same day.  CT chest 12/18/2019. FINDINGS: Cardiovascular: The heart is moderately enlarged. There is no pericardial effusion. Aorta is normal in size. There are atherosclerotic calcifications of the aorta. Mediastinum/Nodes: No enlarged mediastinal or axillary lymph nodes. Thyroid  gland, trachea, and esophagus demonstrate no significant findings. Lungs/Pleura: There is a new slightly spiculated nodule in the right upper lobe anteriorly measuring 6 x 8 mm image 4/61. Focal ground-glass nodular opacity in the left upper lobe measures 1.7 x 1.2 cm and has minimally increased in size and density peripherally. There is a new ground-glass nodule in the left upper lobe measuring 5 mm image 4/63. There is no lung consolidation, pleural effusion or pneumothorax. There is a calcified granuloma in the right upper lobe. Upper Abdomen: Hepatic cysts are unchanged. Cystic area in the right kidney is partially imaged. Musculoskeletal: No chest wall mass or suspicious bone lesions identified. IMPRESSION: 1. New slightly spiculated nodule in the right  upper lobe measuring 6 x 8 mm. Malignancy not excluded. 2. Focal ground-glass nodular opacity in the left upper lobe has minimally increased in size and density peripherally. Slow growing malignancy can not be excluded. Consider one of the following in 3 months for both low-risk and high-risk individuals: (a) repeat chest CT, (b) follow-up PET-CT, or (c) tissue sampling. This recommendation follows the consensus statement: Guidelines for Management of Incidental Pulmonary Nodules Detected on CT Images: From the Fleischner Society 2017; Radiology 2017; 284:228-243. 3. New ground-glass nodule in the left upper lobe measuring 5 mm, indeterminate. 4. Stable cardiomegaly. 5. Aortic atherosclerosis. Aortic Atherosclerosis (ICD10-I70.0). Electronically Signed   By: Greig Pique M.D.   On: 12/10/2023 18:45   US  Venous Img Lower Bilateral Result Date: 12/10/2023 CLINICAL DATA:  edemaand pain EXAM: BILATERAL LOWER EXTREMITY VENOUS DOPPLER ULTRASOUND TECHNIQUE: Gray-scale sonography with graded compression, as well as color Doppler and duplex ultrasound were performed to evaluate the lower  extremity deep venous systems from the level of the common femoral vein and including the common femoral, femoral, profunda femoral, popliteal and calf veins including the posterior tibial, peroneal and gastrocnemius veins when visible. The superficial great saphenous vein was also interrogated. Spectral Doppler was utilized to evaluate flow at rest and with distal augmentation maneuvers in the common femoral, femoral and popliteal veins. COMPARISON:  03/30/2014 FINDINGS: RIGHT LOWER EXTREMITY Common Femoral Vein: No evidence of thrombus. Normal compressibility, respiratory phasicity and response to augmentation. Saphenofemoral Junction: No evidence of thrombus. Normal compressibility and flow on color Doppler imaging. Profunda Femoral Vein: No evidence of thrombus. Normal compressibility and flow on color Doppler imaging. Femoral Vein:  No evidence of thrombus. Normal compressibility, respiratory phasicity and response to augmentation. Popliteal Vein: No evidence of thrombus. Normal compressibility, respiratory phasicity and response to augmentation. Calf Veins: No evidence of thrombus. Normal compressibility and flow on color Doppler imaging. Superficial Great Saphenous Vein: No evidence of thrombus. Normal compressibility. Other Findings:  None. LEFT LOWER EXTREMITY Common Femoral Vein: No evidence of thrombus. Normal compressibility, respiratory phasicity and response to augmentation. Saphenofemoral Junction: No evidence of thrombus. Normal compressibility and flow on color Doppler imaging. Profunda Femoral Vein: No evidence of thrombus. Normal compressibility and flow on color Doppler imaging. Femoral Vein: No evidence of thrombus. Normal compressibility, respiratory phasicity and response to augmentation. Popliteal Vein: No evidence of thrombus. Normal compressibility, respiratory phasicity and response to augmentation. Calf Veins: No evidence of thrombus. Normal compressibility and flow on color Doppler imaging. Superficial Great Saphenous Vein: No evidence of thrombus. Normal compressibility. Other Findings:  None. IMPRESSION: Negative for deep venous thrombosis within both legs. Electronically Signed   By: Rogelia Myers M.D.   On: 12/10/2023 17:06   DG Chest 2 View Result Date: 12/10/2023 CLINICAL DATA:  Cough EXAM: CHEST - 2 VIEW COMPARISON:  Chest radiograph dated 11/26/2013 FINDINGS: Normal lung volumes. Patchy right basilar opacity. No pleural effusion or pneumothorax. Similar mildly enlarged cardiomediastinal silhouette. No acute osseous abnormality. IMPRESSION: Patchy right basilar opacity, which may represent atelectasis, aspiration, or pneumonia. Electronically Signed   By: Limin  Xu M.D.   On: 12/10/2023 14:02   CT ABDOMEN PELVIS WO CONTRAST Result Date: 12/10/2023 CLINICAL DATA:  Status post mechanical fall and unable to  stand EXAM: CT ABDOMEN AND PELVIS WITHOUT CONTRAST TECHNIQUE: Multidetector CT imaging of the abdomen and pelvis was performed following the standard protocol without IV contrast. RADIATION DOSE REDUCTION: This exam was performed according to the departmental dose-optimization program which includes automated exposure control, adjustment of the mA and/or kV according to patient size and/or use of iterative reconstruction technique. COMPARISON:  MRI abdomen dated 08/12/2011 FINDINGS: Lower chest: No focal consolidation or pulmonary nodule in the lung bases. No pleural effusion or pneumothorax demonstrated. Partially imaged heart size is normal. Coronary artery calcifications. Hepatobiliary: Segment 2/4 and 6 hypodensities in keeping with nonenhancing cysts on prior MRI. The segment 6 lesion has increased in size to 1.8 x 1.5 cm, previously 1.0 by 0.8 cm. No intra or extrahepatic biliary ductal dilation. Cholecystectomy. Pancreas: No focal lesions or main ductal dilation. Spleen: Normal in size without focal abnormality. Adrenals/Urinary Tract: Unchanged 1.4 cm left adrenal nodule (3:30), in keeping with adenoma. No specific follow-up imaging recommended. No right adrenal nodule. No suspicious renal masses by noncontrast technique. 3.4 cm simple cyst in the anterior interpolar right kidney. Nonobstructing right lower pole stones measuring up to 4 mm. Hydronephrosis. No focal bladder wall thickening. Stomach/Bowel: Normal appearance of the  stomach. Very large sigmoid diverticulum demonstrates marked mural thickening (3:63). Appendix is not discretely seen. Vascular/Lymphatic: Aortic atherosclerosis. No enlarged abdominal or pelvic lymph nodes. Reproductive: No adnexal masses. Other: No free fluid, fluid collection, or free air. Musculoskeletal: No acute or abnormal lytic or blastic osseous lesions. Multilevel degenerative changes of the partially imaged thoracic and lumbar spine. IMPRESSION: 1. No acute traumatic  injury in the abdomen or pelvis. 2. Very large sigmoid diverticulum demonstrates marked mural thickening, which may represent sequela of chronic diverticulitis. Recommend correlation with any history of recent colonoscopy. 3. Nonobstructing right lower pole renal stones measuring up to 4 mm. 4. Aortic Atherosclerosis (ICD10-I70.0). Coronary artery calcifications. Assessment for potential risk factor modification, dietary therapy or pharmacologic therapy may be warranted, if clinically indicated. Electronically Signed   By: Limin  Xu M.D.   On: 12/10/2023 13:49    Microbiology: Recent Results (from the past 240 hours)  Surgical PCR screen     Status: Abnormal   Collection Time: 12/14/23  1:20 AM   Specimen: Nasal Mucosa; Nasal Swab  Result Value Ref Range Status   MRSA, PCR NEGATIVE NEGATIVE Final   Staphylococcus aureus POSITIVE (A) NEGATIVE Final    Comment: (NOTE) The Xpert SA Assay (FDA approved for NASAL specimens in patients 71 years of age and older), is one component of a comprehensive surveillance program. It is not intended to diagnose infection nor to guide or monitor treatment. Performed at [facility], [address] [place], [place] [address]      Labs: CBC: Recent Labs  Lab 12/14/23 [phone] 12/15/23 0416 12/16/23 0705 12/17/23 0517 12/18/23 0531  WBC 7.3 11.6* 7.1 6.7 7.5  HGB 8.6* 8.8* 8.1* 8.2* 8.5*  HCT 27.0* 27.1* 26.1* 25.9* 26.5*  MCV 87.9 86.3 87.0 86.9 87.7  PLT 277 283 279 274 268   Basic Metabolic Panel: Recent Labs  Lab 12/12/23 0543 12/12/23 0822 12/13/23 0415 12/14/23 0450 12/15/23 0416 12/16/23 0705 12/17/23 0517 12/18/23 0531  NA 137  --  138 140 136 141 139 138  K 4.3  --  4.4 4.2 4.4 4.2 4.5 4.4  CL 107  --  107 111 108 109 110 108  CO2 23  --  26 23 22 24 24 23   GLUCOSE 138*  --  112* 141* 123* 82 97 147*  BUN 22  --  19 22 25* 27* 27* 24*  CREATININE 1.40*  --  1.18* 1.24* 1.12* 1.19* 1.28* 1.20*  CALCIUM 10.5*  --  10.6*  10.2 10.3 10.5* 10.3 10.3  MG  --  1.7 1.8 1.8 1.9  --   --  1.8  PHOS 2.6  --  2.7 2.6 2.4*  --   --  2.2*   Liver Function Tests: No results for input(s): AST, ALT, ALKPHOS, BILITOT, PROT, ALBUMIN in the last 168 hours. No results for input(s): LIPASE, AMYLASE in the last 168 hours. No results for input(s): AMMONIA in the last 168 hours. Cardiac Enzymes: No results for input(s): CKTOTAL, CKMB, CKMBINDEX, TROPONINI in the last 168 hours. BNP (last 3 results) No results for input(s): BNP in the last 8760 hours. CBG: Recent Labs  Lab 12/17/23 1127 12/17/23 1700 12/17/23 2117 12/18/23 0755 12/18/23 1156  GLUCAP 162* 108* 164* 117* 160*    Time spent: 35 minutes  Signed:  Elvan Sor  [facility]  12/18/2023 2:05 PM

## 2023-12-18 NOTE — Discharge Planning (Signed)
 Patient left by EMS, Von MD to sign and fax rx when possible.  Left message with [employer] operational officer at [facility] with the update.

## 2023-12-18 NOTE — Discharge Planning (Signed)
 Discharge given to Grayce, RN from [facility].  Questions answered IV removed, patient was packed and ready to leave.

## 2023-12-18 NOTE — Plan of Care (Signed)

## 2023-12-18 NOTE — Plan of Care (Signed)
  Problem: Health Behavior/Discharge Planning: Goal: Ability to manage health-related needs will improve Outcome: Progressing   Problem: Metabolic: Goal: Ability to maintain appropriate glucose levels will improve Outcome: Progressing   Problem: Nutritional: Goal: Maintenance of adequate nutrition will improve Outcome: Progressing Goal: Progress toward achieving an optimal weight will improve Outcome: Progressing   Problem: Skin Integrity: Goal: Risk for impaired skin integrity will decrease Outcome: Progressing   Problem: Clinical Measurements: Goal: Will remain free from infection Outcome: Progressing Goal: Diagnostic test results will improve Outcome: Progressing Goal: Cardiovascular complication will be avoided Outcome: Progressing   Problem: Activity: Goal: Risk for activity intolerance will decrease Outcome: Progressing   Problem: Coping: Goal: Level of anxiety will decrease Outcome: Progressing   Problem: Elimination: Goal: Will not experience complications related to urinary retention Outcome: Progressing   Problem: Pain Managment: Goal: General experience of comfort will improve and/or be controlled Outcome: Progressing

## 2023-12-19 DIAGNOSIS — N179 Acute kidney failure, unspecified: Secondary | ICD-10-CM | POA: Diagnosis not present

## 2023-12-19 DIAGNOSIS — R911 Solitary pulmonary nodule: Secondary | ICD-10-CM | POA: Diagnosis not present

## 2023-12-19 DIAGNOSIS — J159 Unspecified bacterial pneumonia: Secondary | ICD-10-CM | POA: Diagnosis not present

## 2023-12-19 DIAGNOSIS — E1129 Type 2 diabetes mellitus with other diabetic kidney complication: Secondary | ICD-10-CM | POA: Diagnosis not present

## 2023-12-19 DIAGNOSIS — R634 Abnormal weight loss: Secondary | ICD-10-CM | POA: Diagnosis not present

## 2023-12-21 DIAGNOSIS — N179 Acute kidney failure, unspecified: Secondary | ICD-10-CM | POA: Diagnosis not present

## 2023-12-21 DIAGNOSIS — R634 Abnormal weight loss: Secondary | ICD-10-CM | POA: Diagnosis not present

## 2023-12-21 DIAGNOSIS — E1129 Type 2 diabetes mellitus with other diabetic kidney complication: Secondary | ICD-10-CM | POA: Diagnosis not present

## 2023-12-21 DIAGNOSIS — J189 Pneumonia, unspecified organism: Secondary | ICD-10-CM | POA: Diagnosis not present

## 2023-12-21 DIAGNOSIS — R911 Solitary pulmonary nodule: Secondary | ICD-10-CM | POA: Diagnosis not present

## 2023-12-21 DIAGNOSIS — M6281 Muscle weakness (generalized): Secondary | ICD-10-CM | POA: Diagnosis not present

## 2023-12-21 DIAGNOSIS — R2689 Other abnormalities of gait and mobility: Secondary | ICD-10-CM | POA: Diagnosis not present

## 2023-12-21 DIAGNOSIS — J159 Unspecified bacterial pneumonia: Secondary | ICD-10-CM | POA: Diagnosis not present

## 2023-12-24 DIAGNOSIS — R296 Repeated falls: Secondary | ICD-10-CM | POA: Diagnosis not present

## 2023-12-24 DIAGNOSIS — K5900 Constipation, unspecified: Secondary | ICD-10-CM | POA: Diagnosis not present

## 2023-12-24 DIAGNOSIS — R5383 Other fatigue: Secondary | ICD-10-CM | POA: Diagnosis not present

## 2023-12-24 DIAGNOSIS — I10 Essential (primary) hypertension: Secondary | ICD-10-CM | POA: Diagnosis not present

## 2023-12-24 DIAGNOSIS — R600 Localized edema: Secondary | ICD-10-CM | POA: Diagnosis not present

## 2023-12-24 DIAGNOSIS — E119 Type 2 diabetes mellitus without complications: Secondary | ICD-10-CM | POA: Diagnosis not present

## 2023-12-24 LAB — SURGICAL PATHOLOGY

## 2023-12-25 DIAGNOSIS — R2689 Other abnormalities of gait and mobility: Secondary | ICD-10-CM | POA: Diagnosis not present

## 2023-12-25 DIAGNOSIS — N1832 Chronic kidney disease, stage 3b: Secondary | ICD-10-CM | POA: Diagnosis not present

## 2023-12-25 DIAGNOSIS — R911 Solitary pulmonary nodule: Secondary | ICD-10-CM | POA: Diagnosis not present

## 2023-12-25 DIAGNOSIS — J189 Pneumonia, unspecified organism: Secondary | ICD-10-CM | POA: Diagnosis not present

## 2023-12-25 DIAGNOSIS — G479 Sleep disorder, unspecified: Secondary | ICD-10-CM | POA: Diagnosis not present

## 2023-12-25 DIAGNOSIS — K219 Gastro-esophageal reflux disease without esophagitis: Secondary | ICD-10-CM | POA: Diagnosis not present

## 2023-12-25 DIAGNOSIS — R531 Weakness: Secondary | ICD-10-CM | POA: Diagnosis not present

## 2023-12-25 DIAGNOSIS — I129 Hypertensive chronic kidney disease with stage 1 through stage 4 chronic kidney disease, or unspecified chronic kidney disease: Secondary | ICD-10-CM | POA: Diagnosis not present

## 2023-12-25 DIAGNOSIS — E039 Hypothyroidism, unspecified: Secondary | ICD-10-CM | POA: Diagnosis not present

## 2023-12-25 DIAGNOSIS — R918 Other nonspecific abnormal finding of lung field: Secondary | ICD-10-CM | POA: Diagnosis not present

## 2023-12-25 DIAGNOSIS — E1122 Type 2 diabetes mellitus with diabetic chronic kidney disease: Secondary | ICD-10-CM | POA: Diagnosis not present

## 2023-12-25 DIAGNOSIS — M6281 Muscle weakness (generalized): Secondary | ICD-10-CM | POA: Diagnosis not present

## 2023-12-28 DIAGNOSIS — N1832 Chronic kidney disease, stage 3b: Secondary | ICD-10-CM | POA: Diagnosis not present

## 2023-12-28 DIAGNOSIS — J449 Chronic obstructive pulmonary disease, unspecified: Secondary | ICD-10-CM | POA: Diagnosis not present

## 2023-12-28 DIAGNOSIS — R053 Chronic cough: Secondary | ICD-10-CM | POA: Diagnosis not present

## 2023-12-31 DIAGNOSIS — R41841 Cognitive communication deficit: Secondary | ICD-10-CM | POA: Diagnosis not present

## 2023-12-31 DIAGNOSIS — M6281 Muscle weakness (generalized): Secondary | ICD-10-CM | POA: Diagnosis not present

## 2023-12-31 DIAGNOSIS — E1122 Type 2 diabetes mellitus with diabetic chronic kidney disease: Secondary | ICD-10-CM | POA: Diagnosis not present

## 2023-12-31 DIAGNOSIS — J189 Pneumonia, unspecified organism: Secondary | ICD-10-CM | POA: Diagnosis not present

## 2023-12-31 DIAGNOSIS — R2681 Unsteadiness on feet: Secondary | ICD-10-CM | POA: Diagnosis not present

## 2024-01-01 DIAGNOSIS — R41841 Cognitive communication deficit: Secondary | ICD-10-CM | POA: Diagnosis not present

## 2024-01-01 DIAGNOSIS — R2681 Unsteadiness on feet: Secondary | ICD-10-CM | POA: Diagnosis not present

## 2024-01-01 DIAGNOSIS — M6281 Muscle weakness (generalized): Secondary | ICD-10-CM | POA: Diagnosis not present

## 2024-01-01 DIAGNOSIS — E1122 Type 2 diabetes mellitus with diabetic chronic kidney disease: Secondary | ICD-10-CM | POA: Diagnosis not present

## 2024-01-01 DIAGNOSIS — J189 Pneumonia, unspecified organism: Secondary | ICD-10-CM | POA: Diagnosis not present

## 2024-01-07 DIAGNOSIS — R350 Frequency of micturition: Secondary | ICD-10-CM | POA: Diagnosis not present

## 2024-01-07 DIAGNOSIS — Z09 Encounter for follow-up examination after completed treatment for conditions other than malignant neoplasm: Secondary | ICD-10-CM | POA: Diagnosis not present

## 2024-01-07 DIAGNOSIS — N183 Chronic kidney disease, stage 3 unspecified: Secondary | ICD-10-CM | POA: Diagnosis not present

## 2024-01-07 DIAGNOSIS — I10 Essential (primary) hypertension: Secondary | ICD-10-CM | POA: Diagnosis not present

## 2024-01-07 DIAGNOSIS — E1122 Type 2 diabetes mellitus with diabetic chronic kidney disease: Secondary | ICD-10-CM | POA: Diagnosis not present

## 2024-01-07 DIAGNOSIS — N1832 Chronic kidney disease, stage 3b: Secondary | ICD-10-CM | POA: Diagnosis not present

## 2024-01-07 DIAGNOSIS — I129 Hypertensive chronic kidney disease with stage 1 through stage 4 chronic kidney disease, or unspecified chronic kidney disease: Secondary | ICD-10-CM | POA: Diagnosis not present

## 2024-01-07 DIAGNOSIS — R634 Abnormal weight loss: Secondary | ICD-10-CM | POA: Diagnosis not present

## 2024-01-07 DIAGNOSIS — D631 Anemia in chronic kidney disease: Secondary | ICD-10-CM | POA: Diagnosis not present

## 2024-01-17 ENCOUNTER — Telehealth: Payer: Self-pay

## 2024-01-17 NOTE — Telephone Encounter (Signed)
 Message left for Merlynn to return my call.    Merlynn with [facility] calling to check status of DME order for wheeled walker.  Patient has not had a face to face visit with Dr. Jacobo since 06/14/2022.  DME orders usually require a face to face encounter with provider.  Might be best for [facility] to contact patient PCP for signed orders.

## 2024-01-18 NOTE — Telephone Encounter (Signed)
 Sherry [name] left a message on triage for DME orders.    Returned call and left message on confidential voicemail that patient has not been evaluated by [facility] provider in over a year.  She will need to contact PCP or another provider that has evaluated patient.

## 2024-01-30 DIAGNOSIS — M25562 Pain in left knee: Secondary | ICD-10-CM | POA: Diagnosis not present

## 2024-01-30 DIAGNOSIS — Z23 Encounter for immunization: Secondary | ICD-10-CM | POA: Diagnosis not present

## 2024-01-30 DIAGNOSIS — E7800 Pure hypercholesterolemia, unspecified: Secondary | ICD-10-CM | POA: Diagnosis not present

## 2024-01-30 DIAGNOSIS — N1832 Chronic kidney disease, stage 3b: Secondary | ICD-10-CM | POA: Diagnosis not present

## 2024-01-30 DIAGNOSIS — I129 Hypertensive chronic kidney disease with stage 1 through stage 4 chronic kidney disease, or unspecified chronic kidney disease: Secondary | ICD-10-CM | POA: Diagnosis not present

## 2024-01-30 DIAGNOSIS — Z1331 Encounter for screening for depression: Secondary | ICD-10-CM | POA: Diagnosis not present

## 2024-01-30 DIAGNOSIS — E1122 Type 2 diabetes mellitus with diabetic chronic kidney disease: Secondary | ICD-10-CM | POA: Diagnosis not present

## 2024-01-30 DIAGNOSIS — N183 Chronic kidney disease, stage 3 unspecified: Secondary | ICD-10-CM | POA: Diagnosis not present

## 2024-01-30 DIAGNOSIS — E039 Hypothyroidism, unspecified: Secondary | ICD-10-CM | POA: Diagnosis not present

## 2024-01-30 DIAGNOSIS — Z0001 Encounter for general adult medical examination with abnormal findings: Secondary | ICD-10-CM | POA: Diagnosis not present

## 2024-01-30 NOTE — Progress Notes (Signed)
 Chief Complaint:   No chief complaint on file.   Subjective:   Sherry [name] is a 89 y.o. female in today for her annual physical exam.  She does complain of left knee pain.  Current Outpatient Medications  Medication Sig Dispense Refill  . albuterol  MDI, PROVENTIL , VENTOLIN , PROAIR , HFA 90 mcg/actuation inhaler Inhale 2 inhalations into the lungs every 6 (six) hours as needed for Wheezing (Patient taking differently: Inhale 2 inhalations into the lungs every 6 (six) hours as needed for Wheezing Uses daily) 3 each 3  . atenoloL  (TENORMIN ) 25 MG tablet Take 1 tablet (25 mg total) by mouth once daily 100 tablet 3  . blood glucose diagnostic (ACCU-CHEK AVIVA PLUS TEST STRP) test strip 1 each (1 strip total) once daily To check blood glucose (Patient taking differently: 1 strip once daily To check blood glucose checks 3-4 times a week) 100 strip 2  . blood glucose diagnostic test strip 1 each (1 strip total) 3 (three) times daily Use as instructed. (Patient taking differently: 1 strip 3 (three) times daily Use as instructed. Uses 3-4 times a week) 100 each 3  . blood glucose meter kit as directed 1 each 0  . lancets (ACCU-CHEK FASTCLIX LANCET DRUM) once daily To check blood glucose (Patient taking differently: once daily To check blood glucose uses 3-4 times a week) 100 each 3  . lancing device with lancets kit Use 1 each once daily Use as instructed. (Patient taking differently: Use 1 each once daily Use as instructed. Uses 3-4 times a week) 100 each 1  . levothyroxine  (SYNTHROID ) 25 MCG tablet TAKE 1 TABLET EVERY MORNING AT LEAST 30 TO 60 MINUTES BEFORE BREAKFAST (0630) ON AN EMPTY STOMACH WITH A GLASS OF WATER 90 tablet 3  . losartan  (COZAAR ) 50 MG tablet TAKE 1/2 TABLET ONE TIME DAILY 45 tablet 3  . metFORMIN  (GLUCOPHAGE ) 500 MG tablet TAKE 1 TABLET TWICE DAILY WITH MEALS (Patient taking differently: Take 500 mg by mouth once daily In the mornings) 180 tablet 3  . pantoprazole  (PROTONIX ) 40  MG DR tablet Take 1 tablet (40 mg total) by mouth once daily 90 tablet 3  . polyethylene glycol (MIRALAX) packet Take 17 g by mouth 2 (two) times daily Mix in 4-8ounces of fluid prior to taking.    . acetaminophen  (TYLENOL ) 325 MG tablet Take 650 mg by mouth every 6 (six) hours as needed for Pain (Patient not taking: Reported on 01/30/2024)     No current facility-administered medications for this visit.    Allergies as of 01/30/2024 - Reviewed 01/30/2024  Allergen Reaction Noted  . Penicillin v potassium Unknown 07/31/2013  . Penicillins Rash 04/28/2015    Past Medical History:  Diagnosis Date  . Adrenal nodule (HHS-HCC)    1.3cm left sided   . Anemia   . Bulging discs   . COPD (chronic obstructive pulmonary disease) (CMS/HHS-HCC)   . Diabetes mellitus type 2, uncomplicated (CMS/HHS-HCC) 2011  . Hypertension   . Hyperthyroidism   . Osteoarthritis   . Postmenopausal   . 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 I-131 07/15/2013    Past Surgical History:  Procedure Laterality Date  . COLONOSCOPY  05/20/2002  . APPENDECTOMY    . CESAREAN SECTION     x 2  . CHOLECYSTECTOMY    . Left knee surgery       Family History  Problem Relation Name Age of Onset  . Diabetes Mother    . Heart disease  Mother    . Myocardial Infarction (Heart attack) Father    . Colon cancer Brother    . Prostate cancer Brother    . Asthma Other sibling     Social History:  reports that she quit smoking about 18 years ago. Her smoking use included cigarettes. She started smoking about 68 years ago. She has never used smokeless tobacco. She reports that she does not currently use alcohol. She reports that she does not use drugs.  Results for orders placed or performed in visit on 01/07/24  CBC w/auto Differential (5 Part)  Result Value Ref Range   WBC (White Blood Cell Count) 7.5 4.1 - 10.2 10^3/uL   RBC (Red Blood Cell Count) 3.97 (L) 4.04 - 5.48 10^6/uL   Hemoglobin 10.8 (L) 12.0 -  15.0 gm/dL   Hematocrit 65.2 (L) 64.9 - 47.0 %   MCV (Mean Corpuscular Volume) 87.4 80.0 - 100.0 fl   MCH (Mean Corpuscular Hemoglobin) 27.2 27.0 - 31.2 pg   MCHC (Mean Corpuscular Hemoglobin Concentration) 31.1 (L) 32.0 - 36.0 gm/dL   Platelet Count 710 849 - 450 10^3/uL   RDW-CV (Red Cell Distribution Width) 13.6 11.6 - 14.8 %   MPV (Mean Platelet Volume) 10.4 9.4 - 12.4 fl   Neutrophils 4.71 1.50 - 7.80 10^3/uL   Lymphocytes 1.70 1.00 - 3.60 10^3/uL   Monocytes 0.59 0.00 - 1.50 10^3/uL   Eosinophils 0.37 0.00 - 0.55 10^3/uL   Basophils 0.08 0.00 - 0.09 10^3/uL   Neutrophil % 62.9 32.0 - 70.0 %   Lymphocyte % 22.8 10.0 - 50.0 %   Monocyte % 7.9 4.0 - 13.0 %   Eosinophil % 5.0 1.0 - 5.0 %   Basophil% 1.1 0.0 - 2.0 %   Immature Granulocyte % 0.3 <=0.7 %   Immature Granulocyte Count 0.02 <=0.06 10^3/L  Comprehensive Metabolic Panel (CMP)  Result Value Ref Range   Glucose 133 (H) 70 - 110 mg/dL   Sodium 857 863 - 854 mmol/L   Potassium 4.9 3.6 - 5.1 mmol/L   Chloride 107 97 - 109 mmol/L   Carbon Dioxide (CO2) 28.1 22.0 - 32.0 mmol/L   Urea  Nitrogen (BUN) 42 (H) 7 - 25 mg/dL   Creatinine 1.5 (H) 0.6 - 1.1 mg/dL   Glomerular Filtration Rate (eGFR) 34 (L) >60 mL/min/1.73sq m   Calcium 12.3 (H) 8.7 - 10.3 mg/dL   AST  9 8 - 39 U/L   ALT  8 5 - 38 U/L   Alk Phos (alkaline Phosphatase) 74 34 - 104 U/L   Albumin 4.5 3.5 - 4.8 g/dL   Bilirubin, Total 0.5 0.3 - 1.2 mg/dL   Protein, Total 7.7 6.1 - 7.9 g/dL   A/G Ratio 1.4 1.0 - 5.0 gm/dL  Thyroid  Stimulating-Hormone (TSH)  Result Value Ref Range   Thyroid  Stimulating Hormone (TSH) 4.758 0.450-5.330 uIU/ml uIU/mL  Urinalysis w/Microscopic  Result Value Ref Range   Color Light Yellow Colorless, Straw, Light Yellow, Yellow, Dark Yellow   Clarity Clear Clear   Specific Gravity 1.019 1.005 - 1.030   pH, Urine 6.0 5.0 - 8.0   Protein, Urinalysis Trace (!) Negative mg/dL   Glucose, Urinalysis Negative Negative mg/dL   Ketones,  Urinalysis Negative Negative mg/dL   Blood, Urinalysis Negative Negative   Nitrite, Urinalysis Negative Negative   Leukocyte Esterase, Urinalysis Negative Negative   Bilirubin, Urinalysis Negative Negative   Urobilinogen, Urinalysis 0.2 0.2 - 1.0 mg/dL   WBC, UA 1 <=5 /hpf   Red Blood  Cells, Urinalysis 2 <=3 /hpf   Bacteria, Urinalysis 0-5 0 - 5 /hpf   Squamous Epithelial Cells, Urinalysis 1 /hpf   *Note: Due to a large number of results and/or encounters for the requested time period, some results have not been displayed. A complete set of results can be found in Results Review.      ROS:  General: No fever, chills or recent illness. No change in weight Skin:   No skin lesions, growths, masses, rashes, pruritus  HEENT: No change in vision or hearing. No pain or difficulty with swallowing Respiratory: No cough or shortness of breath CV:  No chest pain or palpitations GI:  No pain, dyspepsia or change in bowel habits GU:  No dysuria, frequency, or hesitancy MSK:  Positive for joint pain  neurological: No headaches, changes in mental status, loss of sensation or strength Endocrine:  No heat or cold intolerance, polydipsia, polyuria  Objective:   Body mass index is 28.09 kg/m.  BP (!) 140/70   Pulse (!) 111   Ht 166.4 cm (5' 5.5)   Wt 77.7 kg (171 lb 6.4 oz)   SpO2 97%   BMI 28.09 kg/m   General: WD/WN female, in no acute distress HEENT: Pupils equal and round, EOMI. oral mucosa moist.  Oropharynx clear. Neck: supple, trachea midline; no thyromegaly Respiratory:clear to auscultation.  No dullness to percussion.  No use of accessory muscles. Cardiac:  Regular rate and rhythm without murmur, gallops, or rubs Vascular: Carotid and radials 2+; distal pulses 2+ Abdominal:soft, nontender, positive bowel sounds.  No organomegaly. Musculoskeletal:  No clubbing, cyanosis or edema.  Full range of motion in upper and lower extremities bilaterally. Neuro: CN grossly intact.  No  acute decrease in sensation in the upper and lower extremities bilaterally. Integumental: Moist with no significant rashes or nodules Lymph: no cervical or supraclavicular lymphadenopathy   Assessment/Plan:   Need for vaccination  (primary encounter diagnosis) Encounter for routine adult physical exam with abnormal findings HTN (hypertension), benign Diabetes mellitus with stage 3 chronic kidney disease (CMS-HCC) Hypothyroidism, unspecified type Pure hypercholesterolemia Anemia in stage 3b chronic kidney disease (CMS-HCC) Stage 3b chronic kidney disease (CMS-HCC) Depression screening (Z13.31) Chronic pain of left knee  Assessment and Plan  1.  Annual physical exam.  Patient is up-to-date for health maintenance for her age.  Reviewed lab results. 2.  Hypertension.  Continue current medication. 3.  Diabetes.  Continue current treatment. 4.  Hypothyroidism.  TSH is normal. 5.  Hyperlipidemia.  Lipid panel is within parameters. 6.  Anemia secondary to kidney disease.  Hemoglobin stable. 7.  Stage III chronic kidney disease.  GFR is stable. 8.  Chronic left knee pain.  Referred her to orthopedics.    Goals     . * Maintain health/healthy lifestyle (pt-stated)    . * Maintain health/healthy lifestyle (pt-stated)    . * Patient Goals (pt-stated)      Declined        NORLEEN ALM ROWER, MD  Portions of this note were created using dictation software and may contain typographical errors.  *Some images could not be shown.

## 2024-02-05 ENCOUNTER — Telehealth: Payer: Self-pay

## 2024-02-05 NOTE — Telephone Encounter (Signed)
 Left message for [facility] to call for clarification on ordering provider.  [facility] calls regarding patient's [facility] and need for orders.  I don't see documentation of Morna SQUIBB, NP signing HH orders.

## 2024-02-12 DIAGNOSIS — M25562 Pain in left knee: Secondary | ICD-10-CM | POA: Diagnosis not present

## 2024-04-04 ENCOUNTER — Ambulatory Visit (INDEPENDENT_AMBULATORY_CARE_PROVIDER_SITE_OTHER): Admitting: Nurse Practitioner
